# Patient Record
Sex: Female | Born: 1980
Health system: Southern US, Community
[De-identification: ages and names within clinical notes are randomized; demographics above are authoritative.]

## PROBLEM LIST (undated history)

## (undated) DIAGNOSIS — F32A Depression, unspecified: Secondary | ICD-10-CM

## (undated) DIAGNOSIS — D6859 Other primary thrombophilia: Secondary | ICD-10-CM

## (undated) DIAGNOSIS — R51 Headache: Secondary | ICD-10-CM

## (undated) DIAGNOSIS — I2699 Other pulmonary embolism without acute cor pulmonale: Secondary | ICD-10-CM

## (undated) DIAGNOSIS — I82409 Acute embolism and thrombosis of unspecified deep veins of unspecified lower extremity: Secondary | ICD-10-CM

## (undated) DIAGNOSIS — R519 Headache, unspecified: Secondary | ICD-10-CM

## (undated) DIAGNOSIS — F329 Major depressive disorder, single episode, unspecified: Secondary | ICD-10-CM

## (undated) DIAGNOSIS — Z8719 Personal history of other diseases of the digestive system: Secondary | ICD-10-CM

## (undated) DIAGNOSIS — F419 Anxiety disorder, unspecified: Secondary | ICD-10-CM

## (undated) DIAGNOSIS — R87629 Unspecified abnormal cytological findings in specimens from vagina: Secondary | ICD-10-CM

## (undated) HISTORY — DX: Other pulmonary embolism without acute cor pulmonale: I26.99

## (undated) HISTORY — DX: Acute embolism and thrombosis of unspecified deep veins of unspecified lower extremity: I82.409

## (undated) HISTORY — PX: LEEP: SHX91

## (undated) HISTORY — PX: FIRST RIB REMOVAL: SHX642

## (undated) HISTORY — DX: Other primary thrombophilia: D68.59

## (undated) HISTORY — DX: Depression, unspecified: F32.A

## (undated) HISTORY — PX: ESOPHAGOGASTRODUODENOSCOPY ENDOSCOPY: SHX5814

## (undated) HISTORY — DX: Anxiety disorder, unspecified: F41.9

## (undated) HISTORY — DX: Major depressive disorder, single episode, unspecified: F32.9

## (undated) HISTORY — PX: NASAL SINUS SURGERY: SHX719

## (undated) HISTORY — DX: Personal history of other diseases of the digestive system: Z87.19

---

## 2003-09-18 ENCOUNTER — Other Ambulatory Visit: Admission: RE | Admit: 2003-09-18 | Discharge: 2003-09-18 | Payer: Self-pay | Admitting: *Deleted

## 2003-09-18 ENCOUNTER — Other Ambulatory Visit: Admission: RE | Admit: 2003-09-18 | Discharge: 2003-09-18 | Payer: Self-pay | Admitting: Obstetrics and Gynecology

## 2004-12-18 ENCOUNTER — Other Ambulatory Visit: Admission: RE | Admit: 2004-12-18 | Discharge: 2004-12-18 | Payer: Self-pay | Admitting: Obstetrics and Gynecology

## 2005-08-06 ENCOUNTER — Emergency Department (HOSPITAL_COMMUNITY): Admission: EM | Admit: 2005-08-06 | Discharge: 2005-08-06 | Payer: Self-pay | Admitting: Emergency Medicine

## 2005-09-23 ENCOUNTER — Emergency Department (HOSPITAL_COMMUNITY): Admission: EM | Admit: 2005-09-23 | Discharge: 2005-09-23 | Payer: Self-pay | Admitting: Family Medicine

## 2006-01-11 ENCOUNTER — Other Ambulatory Visit: Admission: RE | Admit: 2006-01-11 | Discharge: 2006-01-11 | Payer: Self-pay | Admitting: Obstetrics and Gynecology

## 2006-05-16 ENCOUNTER — Emergency Department (HOSPITAL_COMMUNITY): Admission: EM | Admit: 2006-05-16 | Discharge: 2006-05-16 | Payer: Self-pay | Admitting: Emergency Medicine

## 2006-11-04 ENCOUNTER — Emergency Department (HOSPITAL_COMMUNITY): Admission: EM | Admit: 2006-11-04 | Discharge: 2006-11-04 | Payer: Self-pay | Admitting: Family Medicine

## 2007-01-17 ENCOUNTER — Other Ambulatory Visit: Admission: RE | Admit: 2007-01-17 | Discharge: 2007-01-17 | Payer: Self-pay | Admitting: Obstetrics & Gynecology

## 2007-02-09 ENCOUNTER — Inpatient Hospital Stay (HOSPITAL_COMMUNITY): Admission: EM | Admit: 2007-02-09 | Discharge: 2007-02-13 | Payer: Self-pay | Admitting: Family Medicine

## 2007-02-22 ENCOUNTER — Emergency Department (HOSPITAL_COMMUNITY): Admission: EM | Admit: 2007-02-22 | Discharge: 2007-02-22 | Payer: Self-pay | Admitting: Emergency Medicine

## 2007-03-15 ENCOUNTER — Emergency Department (HOSPITAL_COMMUNITY): Admission: EM | Admit: 2007-03-15 | Discharge: 2007-03-15 | Payer: Self-pay | Admitting: Family Medicine

## 2007-04-18 ENCOUNTER — Encounter: Admission: RE | Admit: 2007-04-18 | Discharge: 2007-04-18 | Payer: Self-pay | Admitting: Emergency Medicine

## 2008-01-05 ENCOUNTER — Ambulatory Visit: Payer: Self-pay | Admitting: Internal Medicine

## 2008-01-16 LAB — HYPERCOAGULABLE PANEL, COMPREHENSIVE RET.
Anticardiolipin IgM: 9 [MPL'U] (ref <10)
Beta-2 Glyco I IgG: <4 U/mL (ref <20)
Beta-2-Glycoprotein I IgM: <4 U/mL (ref <10)
Homocysteine: 9.6 umol/L (ref 4.0–15.4)
PTT Lupus Anticoagulant: 38.1 secs (ref 36.3–48.8)
Protein C Activity: 118 % (ref 75–133)
Protein C, Total: 71 % (ref 70–140)
Protein S Ag, Total: 104 % (ref 70–140)

## 2008-01-19 ENCOUNTER — Other Ambulatory Visit: Admission: RE | Admit: 2008-01-19 | Discharge: 2008-01-19 | Payer: Self-pay | Admitting: Obstetrics & Gynecology

## 2008-04-18 IMAGING — CT CT ANGIO CHEST
2 of 5 series · 18 of 36 positions shown · IV contrast (omnipaque)
Comparison: Chest CTA 02/09/07.

CLINICAL DATA: Persistent right back pain and dyspnea.  On Lovenox and Coumadin for recent pulmonary embolism.  
 CT ANGIOGRAPHY OF CHEST:
TECHNIQUE: Multidetector CT imaging of the chest was performed during bolus injection of intravenous contrast.  Multiplanar CT angiographic image reconstructions were generated to evaluate the vascular anatomy.
 Contrast:  100 cc Omnipaque 300.

[Series 3: pe · axial · 0.62mm/px · z∈[-246,-10]mm · 15 of 215 slices shown]
[im 13/215  lung]
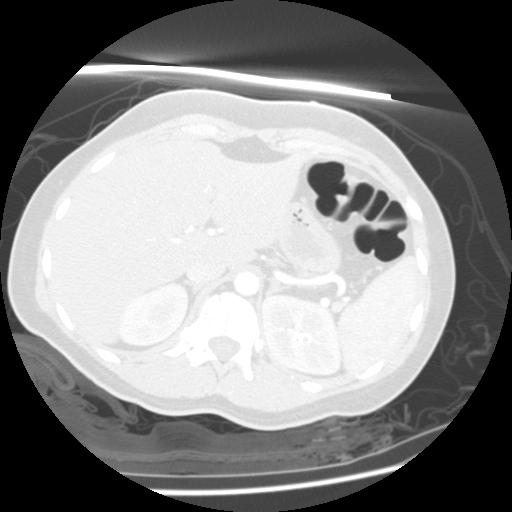
[im 26/215  mediastinal]
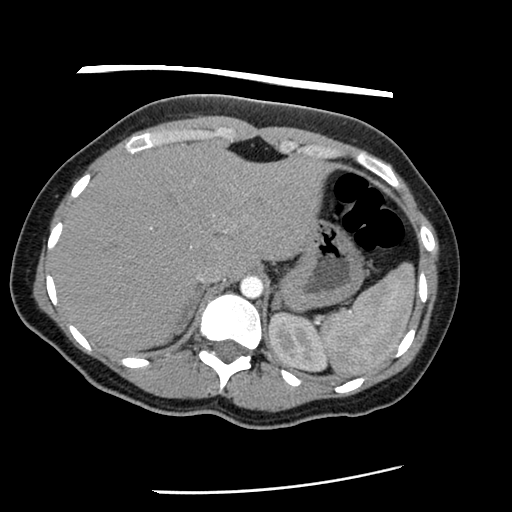
[im 38/215  lung]
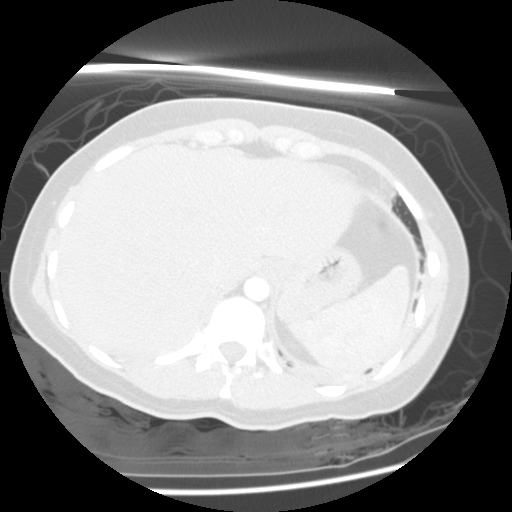
[im 51/215  mediastinal]
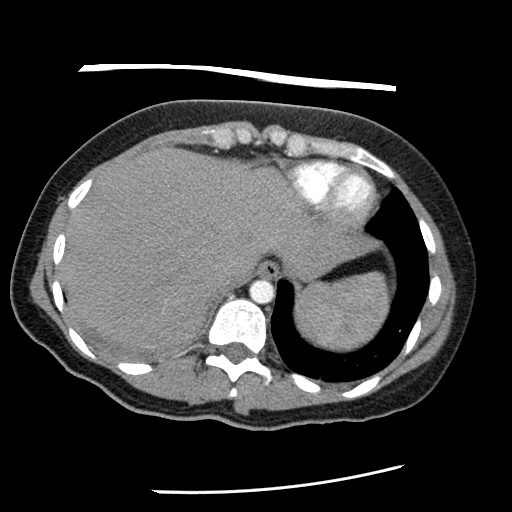
[im 63/215  lung]
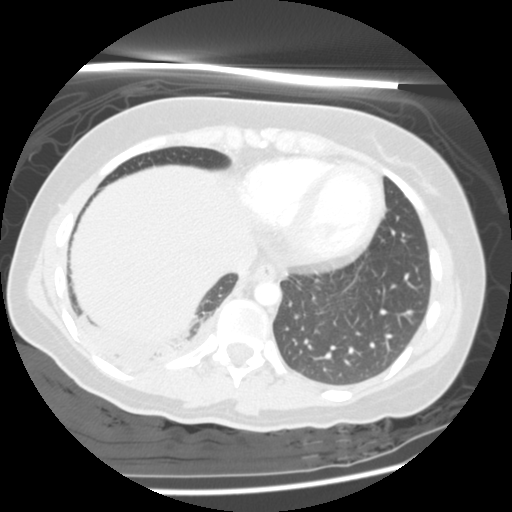
[im 76/215  mediastinal]
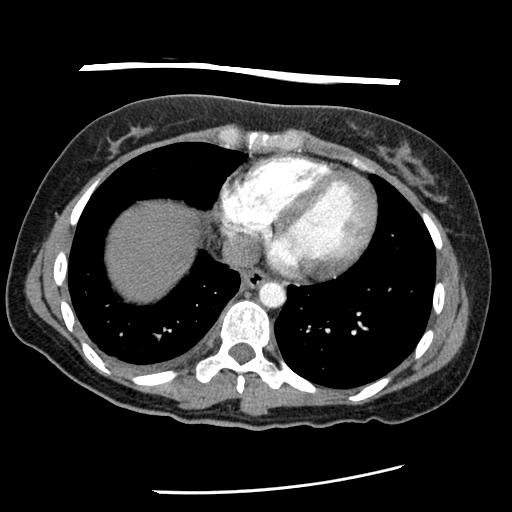
[im 89/215  lung]
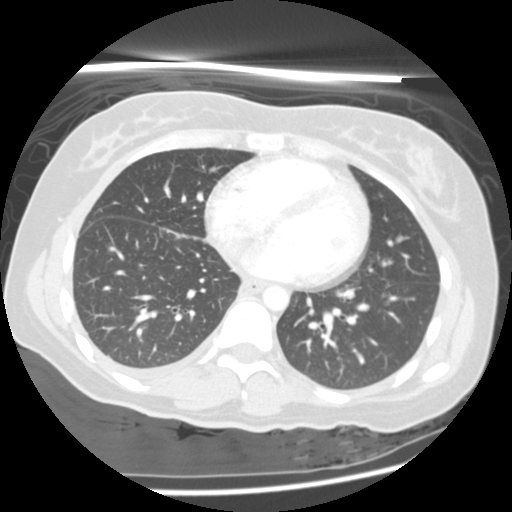
[im 114/215  mediastinal]
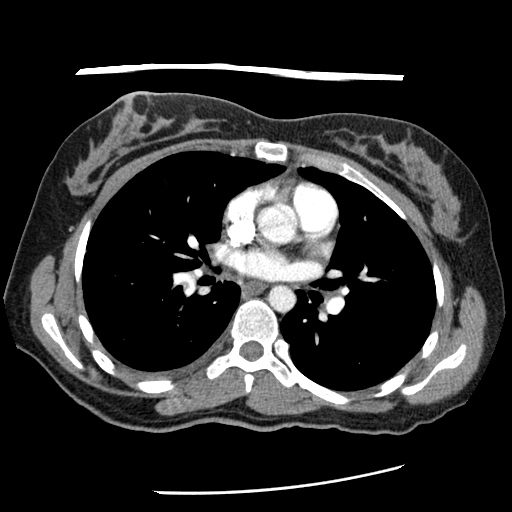
[im 126/215  lung]
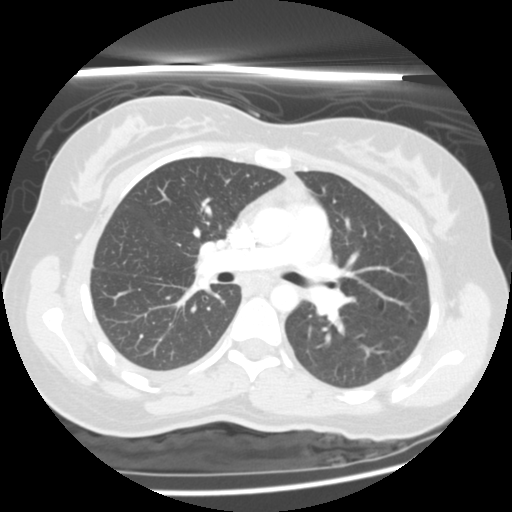
[im 139/215  mediastinal]
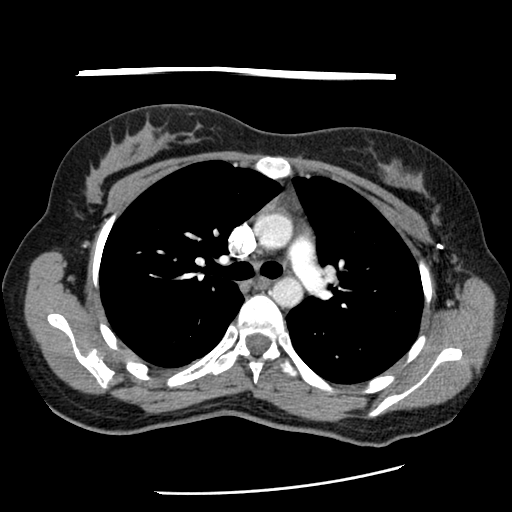
[im 152/215  lung]
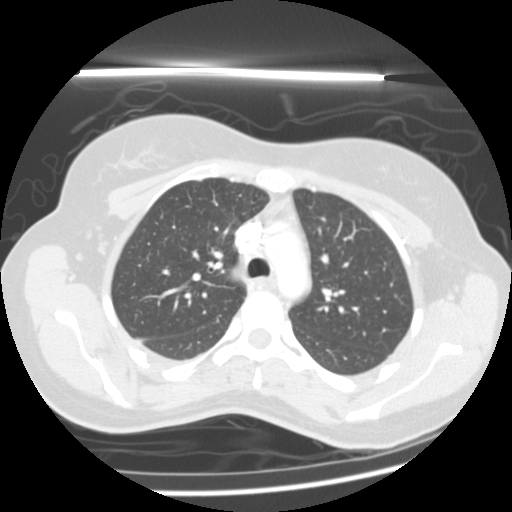
[im 164/215  mediastinal]
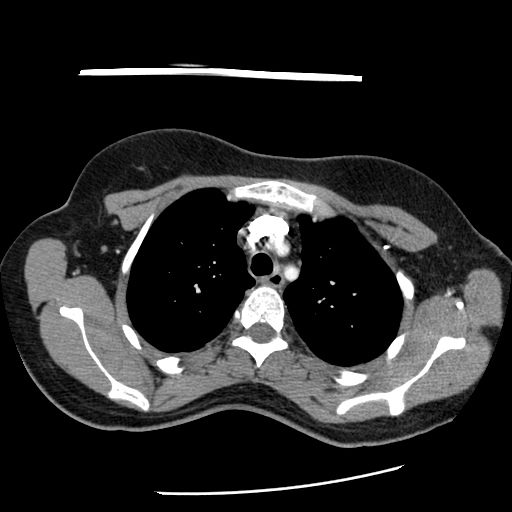
[im 177/215  lung]
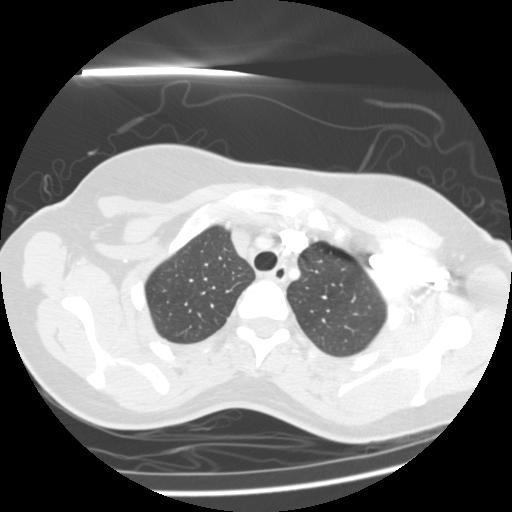
[im 189/215  mediastinal]
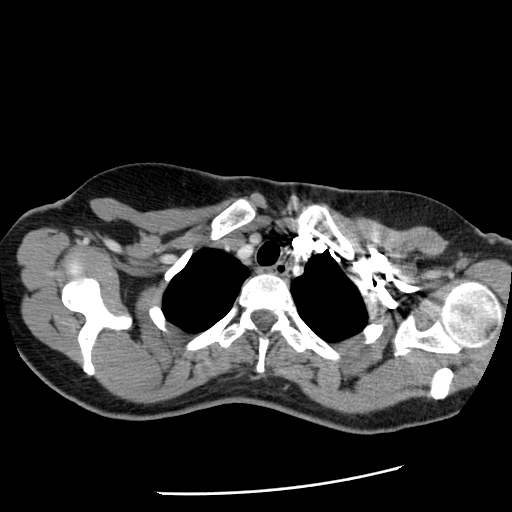
[im 202/215  lung]
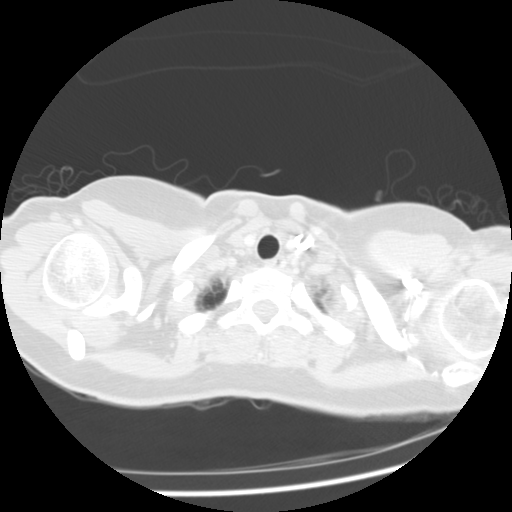

[Series 301: reformatted · coronal · 0.61mm/px · 3 of 111 slices shown]
[im 23/111  mediastinal]
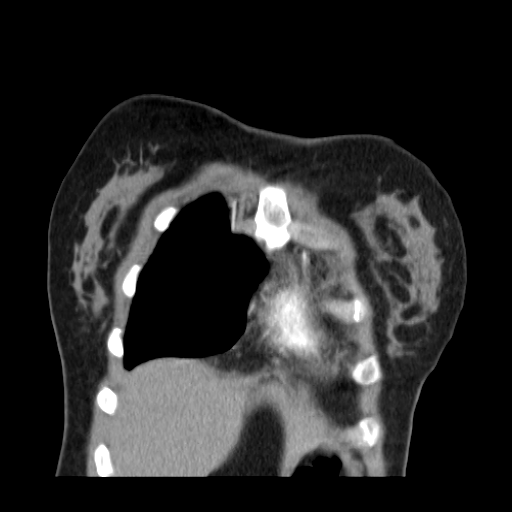
[im 45/111  mediastinal]
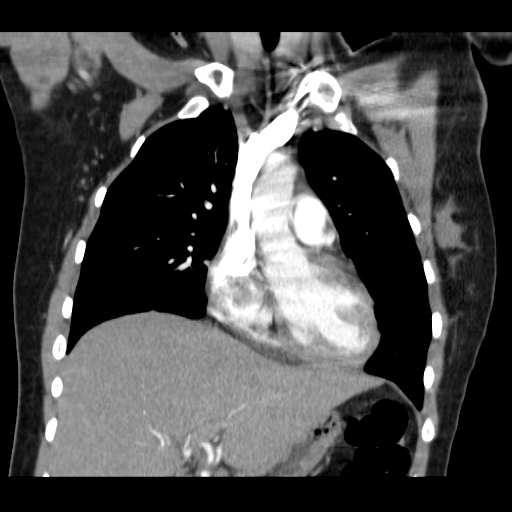
[im 67/111  mediastinal]
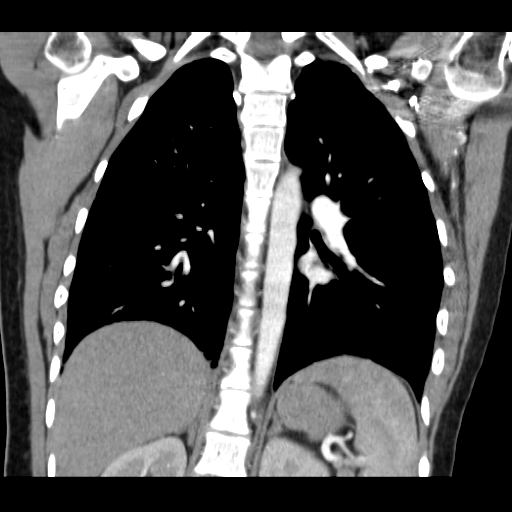

[18 of 36 positions shown; findings below may reference images not displayed]

FINDINGS: The pulmonary arteries are well opacified with contrast.  The previously demonstrated pulmonary emboli bilaterally have largely lysed.  A few residual nonocclusive emboli remain, predominantly on the right.  There is no evidence of interval pulmonary embolism.  The thoracic aorta appears stable.  The mediastinum appears stable with residual thymic tissue.  No enlarged mediastinal or hilar lymph nodes are present.  
 There is a small right pleural effusion, which has enlarged compared with the prior examination.  No left pleural effusion or pericardial effusion is present.  Probable evolving pulmonary infarct in the posterior basal segment of the right lower lobe is noted.  There may be an additional small infarct posteriorly in the superior segment of the right lower lobe.  Mild dependent atelectasis is present at both lung bases.  The exophytic chondroid lesion projecting anteriorly from the right scapular body is without apparent change.  
 Images through the upper abdomen demonstrate no acute findings.
IMPRESSION: 1.  Partial lysis of bilateral pulmonary emboli.  No remaining occlusive thrombi are demonstrated, and there is no evidence of interval thromboembolism.  
 2.  Small right pleural effusion is slightly enlarged compared with the prior exam.  There are probably evolving infarcts in the right lower lobe with associated bibasilar atelectasis.  
 3.  No change exophytic chondroid lesion of the right scapula.  MRI of the scapular lesion is suggested for further evaluation.

## 2010-07-05 ENCOUNTER — Encounter: Payer: Self-pay | Admitting: Emergency Medicine

## 2010-10-27 NOTE — Discharge Summary (Signed)
Sherry Ray, Sherry Ray NO.:  [id number]   MEDICAL RECORD NO.:  [id number]          PATIENT TYPE:  INP   LOCATION:  3704                         FACILITY:  [facility]   PHYSICIAN:  Lonia Blood, M.D.       DATE OF BIRTH:  04-26-1981   DATE OF PROCEDURE:  DATE OF DISCHARGE:                    STAT - MUST CHANGE TO CORRECT WORK TYPE   DISCHARGE DIAGNOSES:  1. Bilateral pulmonary emboli in the lower lobes and right middle      lobe.  2. Right lower lobe consolidation, pulmonary infarct.  3. Small right pleural effusion most likely related to the pulmonary      emboli.  4. Right common iliac vein thrombosis.  5. Multiple bony protuberances of the extremities as well as the      pelvis compatible with multiple hereditary exostoses.  6. A 3 cm exophytic lesion of the right scapula with aggressive      features, question chondrosarcoma.  7. Mild anemia, hemoglobin of 11.5.  8. Mild leukocytosis, white blood cell count stable at 11,000.   DISCHARGE MEDICATIONS:  1. Lovenox 60 mg subcutaneously q.12h.  2. Protonix 40 mg daily.  3. Dilaudid 1 mg IV q.2h. p.r.n.   CONDITION ON DISCHARGE:  The patient is awaiting transfer to [facility] for work up of the scapular mass.   PROCEDURE:  The patient underwent a computerized scan of the chest as  well as computer tomography scan of the abdomen and pelvis with findings  of bilateral pulmonary emboli, small right pleural effusion as well as a  right scapular mass.   CONSULTATIONS:  No consultations obtained.   HISTORY OF PRESENT ILLNESS:  Refer to the dictated H&P done by Dr.  Christoper Allegra.   HOSPITAL COURSE:  1. Bilateral pulmonary emboli. Ms. Sherry Ray was admitted to the      telemetry unit. She had the hypercoagulable work up initiated,      results of which are pending currently. The etiology of the      patient's pulmonary embolus was related to a right iliac vein      thrombosis. The cause of the iliac  vein thrombosis is unclear but      the patient was on Nuva ring which is hormone impregnated and could      be the cause of the pulmonary emboli. The more worrisome finding      was a right scapular mass with features of malignancy which could      also be playing a role in the patient's hypercoagulable state. Ms.      Sherry Ray was initiated on Lovenox subcutaneously, 60 mg twice a      day, and she has stayed stable from her pulmonary emboli point of      view. The patient remained hemodynamically stable without severe      hypoxia. She has still a significant degree of pleuritic chest pain      related to her pulmonary infarction and pleural effusion. This is      treated currently with intravenous Dilaudid.  2. Right scapular mass. The patient's 3 cm scapular mass has been an  incidental finding of the computerized scan of the chest.      Apparently the patient has congenital exostoses but the scapular      mass is very worrisome for the      possibility of a chondrosarcoma. Given the fact that our local      hospital does not work up primary malignant bone tumors we have      contacted [facility] and the patient is pending      transfer there for the work up of the scapular mass.      Lonia Blood, M.D.  Electronically Signed     SL/MEDQ  D:  02/13/2007  T:  02/13/2007  Job:  [id number]

## 2010-10-27 NOTE — H&P (Signed)
NAMEHENRINE, Sherry Ray           ACCOUNT NO.:  [id number]   MEDICAL RECORD NO.:  [id number]          PATIENT TYPE:  INP   LOCATION:  3729                         FACILITY:  [facility]   PHYSICIAN:  Wilson Singer, M.D.DATE OF BIRTH:  1981-03-21   DATE OF ADMISSION:  02/09/2007  DATE OF DISCHARGE:                              HISTORY & PHYSICAL   HISTORY:  This is a very pleasant 30 year old lady, who gives a 2-3 day  history of right side pleuritic chest pain associated with some  shortness of breath.  She went to see the urgent care today and they  were concerned about a right scapular pain and mass and they ordered a  CT of the chest, which revealed bilateral pulmonary emboli as well as a  possible right scapular osteochondroma.  She has a Nuva-Ring for  contraception and just removed it today.  She has not been on any long  journeys.  There is no history of coughing up blood nor any history of  swelling in legs.   Past medical history is significant for a history of a left cervical rib  causing a blood clot in the left side, which needed to have thrombolysis  from her history at the age of 68.   PAST MEDICAL HISTORY:  Sinus surgery 2006.   MEDICATIONS:  Zyrtec for allergies.   ALLERGIES:  None.   SOCIAL HISTORY:  She is single, but lives with her boyfriend.  She does  not smoke and occasionally drinks alcohol.  She works for a [employer].   FAMILY HISTORY:  Noncontributory.   REVIEW OF SYSTEMS:  Apart from the systems mentioned above, there are no  other symptoms referable to all systems reviewed.   PHYSICAL EXAMINATION:  Temperature 98.3, blood pressure 119/56, pulse  134, nice rhythm at rest.  Saturation 98%.  CARDIOVASCULAR:  Heart sounds are present and normal without murmurs.  There is no right ventricular heave.  The lung fields are clear with no pleural rub.  Abdomen is soft, nontender with no hepatosplenomegaly.  There are no  masses felt.  NEUROLOGICAL:  She is alert and oriented with no focal neurologic signs.   INVESTIGATIONS:  Sodium 139, potassium 3.6, BUN 8, creatinine 0.7,  glucose 84. CT angio chest shows bilateral pulmonary emboli and also  suggestion of an osteochondroma on the right scapular.   IMPRESSION:  1. Bilateral pulmonary emboli.  2. Right scapular possible osteochondroma.   PLAN:  1. Admit.  2. Anticoagulated with Lovenox and Coumadin.  3. CT of the abdomen and pelvis and venous Dopplers in the legs.  4. Hypercoagulopathy panel.  5. Need to define the right scapular mass and may require MRI      scanning.  We will await the full report.   Further recommendations will depend on patient's hospital progress.      Wilson Singer, M.D.  Electronically Signed     NCG/MEDQ  D:  02/09/2007  T:  02/10/2007  Job:  [id number]

## 2011-03-25 LAB — POCT RAPID STREP A: Streptococcus, Group A Screen (Direct): NEGATIVE

## 2011-03-26 LAB — CBC
HCT: 31.8 — ABNORMAL LOW
Hemoglobin: 10.8 — ABNORMAL LOW
MCHC: 33.9
MCV: 82.9
Platelets: 399
Platelets: 301
Platelets: 302
Platelets: 360
RBC: 4.08
RDW: 12.3
RDW: 12.6
WBC: 10.9 — ABNORMAL HIGH
WBC: 11.2 — ABNORMAL HIGH
WBC: 13.2 — ABNORMAL HIGH

## 2011-03-26 LAB — BASIC METABOLIC PANEL WITH GFR
BUN: 4 — ABNORMAL LOW
CO2: 26
Calcium: 8.7
Chloride: 100
Creatinine, Ser: 0.61
GFR calc non Af Amer: >60
Glucose, Bld: 100 — ABNORMAL HIGH
Potassium: 3.9
Sodium: 135

## 2011-03-26 LAB — POCT I-STAT CREATININE
Creatinine, Ser: 0.8
Creatinine, Ser: 0.7
Operator id: 285491
Operator id: 270111

## 2011-03-26 LAB — FACTOR 5 LEIDEN

## 2011-03-26 LAB — PROTEIN S ACTIVITY: Protein S Activity: 82 % (ref 69–129)

## 2011-03-26 LAB — BETA-2-GLYCOPROTEIN I ABS, IGG/M/A
Beta-2 Glyco I IgG: <4 U/mL (ref <20)
Beta-2-Glycoprotein I IgA: <4 U/mL (ref <10)

## 2011-03-26 LAB — PROTIME-INR
INR: 1
INR: 1
INR: 1.2
Prothrombin Time: 19.1 — ABNORMAL HIGH
Prothrombin Time: 13.4
Prothrombin Time: 13.7
Prothrombin Time: 14

## 2011-03-26 LAB — I-STAT 8, (EC8 V) (CONVERTED LAB)
Acid-Base Excess: 1
Acid-base deficit: 3 — ABNORMAL HIGH
Bicarbonate: 26.2 — ABNORMAL HIGH
Bicarbonate: 20.8
Glucose, Bld: 84
HCT: 40
Hemoglobin: 13.3
Operator id: 285491
Operator id: 270111
Sodium: 139
TCO2: 27
TCO2: 22
pCO2, Ven: 41.3 — ABNORMAL LOW
pH, Ven: 7.41 — ABNORMAL HIGH

## 2011-03-26 LAB — SAMPLE TO BLOOD BANK

## 2011-03-26 LAB — APTT
aPTT: 27
aPTT: 31
aPTT: 35
aPTT: 39 — ABNORMAL HIGH

## 2011-03-26 LAB — COMPREHENSIVE METABOLIC PANEL
Albumin: 3 — ABNORMAL LOW
BUN: 5 — ABNORMAL LOW
CO2: 24
Chloride: 103
Creatinine, Ser: 0.65
GFR calc non Af Amer: >60
Total Bilirubin: 0.5

## 2011-03-26 LAB — ANTITHROMBIN III: AntiThromb III Func: 45 — ABNORMAL LOW (ref 76–126)

## 2011-03-26 LAB — LUPUS ANTICOAGULANT PANEL
Lupus Anticoagulant: NOT DETECTED
dRVVT Incubated 1:1 Mix: 41.1 (ref 36.1–47.0)

## 2011-03-26 LAB — HOMOCYSTEINE: Homocysteine: 1.7 — ABNORMAL LOW

## 2011-03-26 LAB — PROTEIN C ACTIVITY: Protein C Activity: 66 % — ABNORMAL LOW (ref 75–133)

## 2011-03-26 LAB — PROTEIN S, TOTAL: Protein S Ag, Total: 100 % (ref 70–140)

## 2011-03-26 LAB — PROTHROMBIN GENE MUTATION

## 2011-03-26 LAB — CARDIOLIPIN ANTIBODIES, IGG, IGM, IGA: Anticardiolipin IgA: <7 — ABNORMAL LOW (ref <13)

## 2011-03-26 LAB — PROTEIN C, TOTAL: Protein C, Total: 52 % — ABNORMAL LOW (ref 70–140)

## 2011-11-11 ENCOUNTER — Encounter: Payer: Self-pay | Admitting: Family Medicine

## 2011-11-11 ENCOUNTER — Ambulatory Visit: Payer: Self-pay | Admitting: Family Medicine

## 2011-11-11 VITALS — BP 121/80 | HR 77 | Temp 98.3°F | Resp 16 | Ht 64.5 in | Wt 122.0 lb

## 2011-11-11 DIAGNOSIS — L600 Ingrowing nail: Secondary | ICD-10-CM

## 2011-11-11 DIAGNOSIS — IMO0002 Reserved for concepts with insufficient information to code with codable children: Secondary | ICD-10-CM

## 2011-11-11 MED ORDER — AMOXICILLIN-POT CLAVULANATE 875-125 MG PO TABS: 1.0000 | ORAL_TABLET | Freq: Two times a day (BID) | ORAL | Status: AC

## 2011-11-11 NOTE — Progress Notes (Signed)
  Subjective:    Patient ID: Sherry Ray, female    DOB: [birth year], 31 y.o.   MRN: [id number]  HPI    Review of Systems     Objective:   Physical Exam        Assessment & Plan:  Reviewed.  Agree with assessment and plan.

## 2011-11-11 NOTE — Progress Notes (Signed)
VCO. Digital block with 4 cc 2% lidocaine plain. SP and D.  Medial nail lifted and removed. Xeroform applied. Patient tolerated procedure well. Wound was cleaned and bandaged.

## 2011-11-11 NOTE — Patient Instructions (Addendum)
Paronychia Paronychia is an inflammatory reaction involving the folds of the skin surrounding the fingernail. This is commonly caused by an infection in the skin around a nail. The most common cause of paronychia is frequent wetting of the hands (as seen with bartenders, food servers, nurses or others who wet their hands). This makes the skin around the fingernail susceptible to infection by bacteria (germs) or fungus. Other predisposing factors are:  Aggressive manicuring.   Nail biting.   Thumb sucking.  The most common cause is a staphylococcal (a type of germ) infection, or a fungal (Candida) infection. When caused by a germ, it usually comes on suddenly with redness, swelling, pus and is often painful. It may get under the nail and form an abscess (collection of pus), or form an abscess around the nail. If the nail itself is infected with a fungus, the treatment is usually prolonged and may require oral medicine for up to one year. Your caregiver will determine the length of time treatment is required. The paronychia caused by bacteria (germs) may largely be avoided by not pulling on hangnails or picking at cuticles. When the infection occurs at the tips of the finger it is called felon. When the cause of paronychia is from the herpes simplex virus (HSV) it is called herpetic whitlow. TREATMENT  When an abscess is present treatment is often incision and drainage. This means that the abscess must be cut open so the pus can get out. When this is done, the following home care instructions should be followed. HOME CARE INSTRUCTIONS   It is important to keep the affected fingers very dry. Rubber or plastic gloves over cotton gloves should be used whenever the hand must be placed in water.   Keep wound clean, dry and dressed as suggested by your caregiver between warm soaks or warm compresses.   Soak in warm water for fifteen to twenty minutes three to four times per day for bacterial infections.  Fungal infections are very difficult to treat, so often require treatment for long periods of time.   For bacterial (germ) infections take antibiotics (medicine which kill germs) as directed and finish the prescription, even if the problem appears to be solved before the medicine is gone.   Only take over-the-counter or prescription medicines for pain, discomfort, or fever as directed by your caregiver.  SEEK IMMEDIATE MEDICAL CARE IF:  You have redness, swelling, or increasing pain in the wound.   You notice pus coming from the wound.   You have a fever.   You notice a bad smell coming from the wound or dressing.  Document Released: 11/24/2000 Document Revised: 05/20/2011 Document Reviewed: 07/26/2008 [facility] Patient Information 2012 [place], [place].  INGROWN TOENAIL . Keep area clean, dry and bandaged for 24 hours. . After 24 hours, remove outer bandage and leave yellow gauze in place. Nuala Alpha toe/foot in warm soapy water for 5-10 minutes, once daily for 5 days. Rebandage toe after each cleaning. . Continue soaks until yellow gauze falls off. . Notify the office if you experience any of the following signs of infection: Swelling, redness, pus drainage, streaking, fever > 101.0 F

## 2011-11-11 NOTE — Progress Notes (Signed)
  Subjective:    Patient ID: Sherry Ray, female    DOB: Mar 05, 1981, 31 y.o.   MRN: [id number]  HPI L great toe swelling x 1 week.  Attempted to cut great toe nail and noticed significant lateral toe nail swelling 1-2 days later.  Has had mild purulent drainage that has progressively worsened over last 3-4 days.  Works on [employer] all day in [employer].  Has been using peroxide with minimal improvement in sxs.  No systemic sxs including fevers, chills, nausea, vomiting.   Review of Systems See HPI, otherwise ROS negative     Objective:   Physical Exam  Musculoskeletal:       Feet:   Gen: in bed, NAD HEENT: NCAT, EOMI CV: RRR PULM: CTAB        Assessment & Plan:  Paronychia: toe nail resection of affected portion at bedside. Augmentin x 10 days. Infectious red flags reviewed. Follow up in 3-5 days or sooner if needed.

## 2013-08-21 ENCOUNTER — Ambulatory Visit (INDEPENDENT_AMBULATORY_CARE_PROVIDER_SITE_OTHER): Payer: BC Managed Care – PPO | Admitting: Emergency Medicine

## 2013-08-21 VITALS — BP 122/72 | HR 82 | Temp 98.4°F | Resp 17 | Ht 64.5 in | Wt 142.0 lb

## 2013-08-21 DIAGNOSIS — M7700 Medial epicondylitis, unspecified elbow: Secondary | ICD-10-CM

## 2013-08-21 NOTE — Patient Instructions (Signed)
Medial Epicondylitis (Golfer's Elbow) with Rehab Medial epicondylitis involves inflammation and pain around the inner (medial) portion of the elbow. This pain is caused by inflammation of the tendons in the forearm that flex (bring down) the wrist. Medial epicondylitis is also called golfer's elbow, because it is common among golfers. However, it may occur in any individual who flexes the wrist regularly. If medial epicondylitis is left untreated, it may become a chronic problem. SYMPTOMS   Pain, tenderness, or inflammation over the inner (medial) side of the elbow.  Pain or weakness with gripping activities.  Pain that increases with wrist twisting motions (using a screwdriver, playing golf, bowling). CAUSES  Medial epicondylitis is caused by inflammation of the tendons that flex the wrist. Causes of injury may include:  Chronic, repetitive stress and strain to the tendons that run from the wrist and forearm to the elbow.  Sudden strain on the forearm, including wrist snap when serving balls with racquet sports, or throwing a baseball. RISK INCREASES WITH:  Sports or occupations that require repetitive and/or strenuous forearm and wrist movements (pitching a baseball, golfing, carpentry).  Poor wrist and forearm strength and flexibility.  Failure to warm up properly before activity.  Resuming activity before healing, rehabilitation, and conditioning are complete. PREVENTION   Warm up and stretch properly before activity.  Maintain physical fitness:  Strength, flexibility, and endurance.  Cardiovascular fitness.  Wear and use properly fitted equipment.  Learn and use proper technique and have a coach correct improper technique.  Wear a tennis elbow (counterforce) brace. PROGNOSIS  The course of this condition depends on the degree of the injury. If treated properly, acute cases (symptoms lasting less than 4 weeks) are often resolved in 2 to 6 weeks. Chronic (longer lasting  cases) often resolve in 3 to 6 months, but may require physical therapy. RELATED COMPLICATIONS   Frequently recurring symptoms, resulting in a chronic problem. Properly treating the problem the first time decreases frequency of recurrence.  Chronic inflammation, scarring, and partial tendon tear, requiring surgery.  Delayed healing or resolution of symptoms. TREATMENT  Treatment first involves the use of ice and medicine, to reduce pain and inflammation. Strengthening and stretching exercises may reduce discomfort, if performed regularly. These exercises may be performed at home, if the condition is an acute injury. Chronic cases may require a referral to a physical therapist for evaluation and treatment. Your caregiver may advise a corticosteroid injection to help reduce inflammation. Rarely, surgery is needed. MEDICATION  If pain medicine is needed, nonsteroidal anti-inflammatory medicines (aspirin and ibuprofen), or other minor pain relievers (acetaminophen), are often advised.  Do not take pain medicine for 7 days before surgery.  Prescription pain relievers may be given, if your caregiver thinks they are needed. Use only as directed and only as much as you need.  Corticosteroid injections may be recommended. These injections should be reserved only for the most severe cases, because they can only be given a certain number of times. HEAT AND COLD  Cold treatment (icing) should be applied for 10 to 15 minutes every 2 to 3 hours for inflammation and pain, and immediately after activity that aggravates your symptoms. Use ice packs or an ice massage.  Heat treatment may be used before performing stretching and strengthening activities prescribed by your caregiver, physical therapist, or athletic trainer. Use a heat pack or a warm water soak. SEEK MEDICAL CARE IF: Symptoms get worse or do not improve in 2 weeks, despite treatment. EXERCISES  RANGE OF MOTION (  prescribed by your caregiver, physical therapist, or athletic trainer. Use a heat pack or a warm water soak.  SEEK MEDICAL CARE IF:  Symptoms get worse or do not improve in 2 weeks, despite treatment.  EXERCISES   RANGE OF MOTION (ROM) AND STRETCHING EXERCISES -  Epicondylitis, Medial (Golfer's Elbow)  These exercises may help you when beginning to rehabilitate your injury. Your symptoms may go away with or without further involvement from your physician, physical therapist or athletic trainer. While completing these exercises, remember:   · Restoring tissue flexibility helps normal motion to return to the joints. This allows healthier, less painful movement and activity.  · An effective stretch should be held for at least 30 seconds.  · A stretch should never be painful. You should only feel a gentle lengthening or release in the stretched tissue.  RANGE OF MOTION  Wrist Flexion, Active-Assisted  · Extend your right / left elbow with your fingers pointing down.*  · Gently pull the back of your hand towards you, until you feel a gentle stretch on the top of your forearm.  · Hold this position for __________ seconds.  Repeat __________ times. Complete this exercise __________ times per day.   *If directed by your physician, physical therapist or athletic trainer, complete this stretch with your elbow bent, rather than extended.  RANGE OF MOTION  Wrist Extension, Active-Assisted  · Extend your right / left elbow and turn your palm upwards.*  · Gently pull your palm and fingertips back, so your wrist extends and your fingers point more toward the ground.  · You should feel a gentle stretch on the inside of your forearm.  · Hold this position for __________ seconds.  Repeat __________ times. Complete this exercise __________ times per day.  *If directed by your physician, physical therapist or athletic trainer, complete this stretch with your elbow bent, rather than extended.  STRETCH  Wrist Extension   · Place your right / left fingertips on a tabletop leaving your elbow slightly bent. Your fingers should point backwards.  · Gently press your fingers and palm down onto the table, by straightening your elbow. You should feel a stretch on the inside of your forearm.  · Hold this  position for __________ seconds.  Repeat __________ times. Complete this stretch __________ times per day.   STRENGTHENING EXERCISES - Epicondylitis, Medial (Golfer's Elbow)  These exercises may help you when beginning to rehabilitate your injury. They may resolve your symptoms with or without further involvement from your physician, physical therapist or athletic trainer. While completing these exercises, remember:   · Muscles can gain both the endurance and the strength needed for everyday activities through controlled exercises.  · Complete these exercises as instructed by your physician, physical therapist or athletic trainer. Increase the resistance and repetitions only as guided.  · You may experience muscle soreness or fatigue, but the pain or discomfort you are trying to eliminate should never worsen during these exercises. If this pain does get worse, stop and make sure you are following the directions exactly. If the pain is still present after adjustments, discontinue the exercise until you can discuss the trouble with your caregiver.  STRENGTH Wrist Flexors  · Sit with your right / left forearm palm-up, and fully supported on a table or countertop. Your elbow should be resting below the height of your shoulder. Allow your wrist to extend over the edge of the surface.  · Loosely holding a __________ weight, or a piece   of rubber exercise band or tubing, slowly curl your hand up toward your forearm.  · Hold this position for __________ seconds. Slowly lower the wrist back to the starting position in a controlled manner.  Repeat __________ times. Complete this exercise __________ times per day.   STRENGTH  Wrist Extensors  · Sit with your right / left forearm palm-down and fully supported. Your elbow should be resting below the height of your shoulder. Allow your wrist to extend over the edge of the surface.  · Loosely holding a __________ weight, or a piece of rubber exercise band or tubing, slowly curl  your hand up toward your forearm.  · Hold this position for __________ seconds. Slowly lower the wrist back to the starting position in a controlled manner.  Repeat __________ times. Complete this exercise __________ times per day.   STRENGTH - Ulnar Deviators  · Stand with a ____________________ weight in your right / left hand, or sit while holding a rubber exercise band or tubing, with your healthy arm supported on a table or countertop.  · Move your wrist so that your pinkie travels toward your forearm and your thumb moves away from your forearm.  · Hold this position for __________ seconds and then slowly lower the wrist back to the starting position.  Repeat __________ times. Complete this exercise __________ times per day  STRENGTH - Grip   · Grasp a tennis ball, a dense sponge, or a large, rolled sock in your hand.  · Squeeze as hard as you can, without increasing any pain.  · Hold this position for __________ seconds. Release your grip slowly.  Repeat __________ times. Complete this exercise __________ times per day.   STRENGTH  Forearm Supinators   · Sit with your right / left forearm supported on a table, keeping your elbow below shoulder height. Rest your hand over the edge, palm down.  · Gently grip a hammer or a soup ladle.  · Without moving your elbow, slowly turn your palm and hand upward to a "thumbs-up" position.  · Hold this position for __________ seconds. Slowly return to the starting position.  Repeat __________ times. Complete this exercise __________ times per day.   STRENGTH  Forearm Pronators  · Sit with your right / left forearm supported on a table, keeping your elbow below shoulder height. Rest your hand over the edge, palm up.  · Gently grip a hammer or a soup ladle.  · Without moving your elbow, slowly turn your palm and hand upward to a "thumbs-up" position.  · Hold this position for __________ seconds. Slowly return to the starting position.  Repeat __________ times. Complete this  exercise __________ times per day.   Document Released: 05/31/2005 Document Revised: 08/23/2011 Document Reviewed: 09/12/2008  [employer]® Patient Information ©2014 [employer].

## 2013-08-21 NOTE — Progress Notes (Signed)
   Subjective:    Patient ID: Sherry Ray, female    DOB: 09-12-1980, 33 y.o.   MRN: [id number]  HPI 33 yo female with complaint of 2 weeks of right medial elbow pain.  Onset after lifting heavy objects a lot one day.  No specific injury.  Progressively worsening symptoms over next day.  Worse now with activity.  No numbness or radiation.  No swelling.  No weakness.  Not worsened by playing violin  SH:  Smoker, occasional alcohol  PPMH:  Non contributory.   Review of Systems  Constitutional: Negative for fever and chills.  Musculoskeletal: Positive for joint swelling. Negative for back pain, myalgias, neck pain and neck stiffness.  Skin: Negative for color change and rash.  Neurological: Negative for weakness and numbness.       Objective:   Physical Exam Blood pressure 122/72, pulse 82, temperature 98.4 F (36.9 C), temperature source Oral, resp. rate 17, height 5' 4.5" (1.638 m), weight 142 lb (64.411 kg), last menstrual period 08/07/2013, SpO2 96.00%. Body mass index is 24.01 kg/(m^2). Well-developed, well nourished female who is awake, alert and oriented, in NAD. HEENT: [place]/AT, PERRL, EOMI.  Sclera and conjunctiva are clear.  Neck: supple Heart: RRR, no murmur Lungs: normal effort, CTA Abdomen: normo-active bowel sounds, supple, non-tender, no mass or organomegaly. Extremities: Right elbow:  5/5 strength (flexion, extension, pronation, suppination), mild TTP medial joint line, valgus stress of elbow with no evidence of laxity when compared to left.  No effusion. Skin: warm and dry without rash. Psychologic: good mood and appropriate affect, normal speech and behavior.     Assessment & Plan:  Medial epicondylitis - eccentric stretching, nsaids as needed.  Return if no improvement in 4-6 weeks.

## 2015-12-17 ENCOUNTER — Ambulatory Visit (INDEPENDENT_AMBULATORY_CARE_PROVIDER_SITE_OTHER): Payer: BLUE CROSS/BLUE SHIELD | Admitting: Physician Assistant

## 2015-12-17 DIAGNOSIS — F418 Other specified anxiety disorders: Secondary | ICD-10-CM | POA: Insufficient documentation

## 2015-12-17 DIAGNOSIS — L03111 Cellulitis of right axilla: Secondary | ICD-10-CM

## 2015-12-17 MED ORDER — DOXYCYCLINE HYCLATE 100 MG PO TABS: 100.0000 mg | ORAL_TABLET | Freq: Two times a day (BID) | ORAL | Status: DC

## 2015-12-17 NOTE — Progress Notes (Signed)
   Sherry Ray  MRN: [id number] DOB: [birth year]  Subjective:  Pt presents to clinic with a knot in her right axilla - she noticed it about 3 days ago and is not sure if it has changed since her noticed it.  She has not had fevers or chills since she noticed the bump.  She has not had an injury to the area.  She has never had anything like this before.  Patient Active Problem List   Diagnosis Date Noted  . Depression with anxiety 12/17/2015    No current outpatient prescriptions on file prior to visit.   No current facility-administered medications on file prior to visit.    No Known Allergies  Review of Systems  Constitutional: Negative for fever and chills.   Objective:  BP 120/82 mmHg  Pulse 85  Temp(Src) 98.3 F (36.8 C) (Oral)  Resp 16  Ht 5' 4.5" (1.638 m)  Wt 150 lb (68.04 kg)  BMI 25.36 kg/m2  SpO2 98%  LMP 12/10/2015  Physical Exam  Constitutional: She is oriented to person, place, and time and well-developed, well-nourished, and in no distress.  HENT:  Head: Normocephalic and atraumatic.  Right Ear: Hearing and external ear normal.  Left Ear: Hearing and external ear normal.  Eyes: Conjunctivae are normal.  Neck: Normal range of motion.  Cardiovascular: Normal rate, regular rhythm and normal heart sounds.   No murmur heard. Pulmonary/Chest: Effort normal and breath sounds normal. She has no wheezes.  Lymphadenopathy:    She has axillary adenopathy.       Right axillary: Pectoral (nontender) adenopathy present.  At inferior most part of the axilla there is a 1.5-2cm tender nodules with erythema on the surface of the skin over the nodule.  Neurological: She is alert and oriented to person, place, and time. Gait normal.  Skin: Skin is warm and dry.  Psychiatric: Mood, memory, affect and judgment normal.  Vitals reviewed.   Assessment and Plan :  Cellulitis of axilla, right - Plan: doxycycline (VIBRA-TABS) 100 MG tablet   Suspect an abcess and will  treat with abx - she will use warm compresses - and monitor if not resolved or improving she will contact me.  Windell Hummingbird PA-C  [facility] 12/17/2015 5:21 PM

## 2015-12-17 NOTE — Patient Instructions (Addendum)
  Warm compresses to the area    IF you received an x-ray today, you will receive an invoice from [facility]. Please contact [facility] at [phone] with questions or concerns regarding your invoice.   IF you received labwork today, you will receive an invoice from [employer]. Please contact [facility] at [phone] with questions or concerns regarding your invoice.   Our billing staff will not be able to assist you with questions regarding bills from these companies.  You will be contacted with the lab results as soon as they are available. The fastest way to get your results is to activate your My Chart account. Instructions are located on the last page of this paperwork. If you have not heard from [place] regarding the results in 2 weeks, please contact this office.

## 2016-07-14 ENCOUNTER — Ambulatory Visit (INDEPENDENT_AMBULATORY_CARE_PROVIDER_SITE_OTHER): Payer: BLUE CROSS/BLUE SHIELD | Admitting: Family Medicine

## 2016-07-14 VITALS — BP 122/72 | HR 90 | Temp 98.8°F | Resp 17 | Ht 65.0 in | Wt 155.0 lb

## 2016-07-14 DIAGNOSIS — Z23 Encounter for immunization: Secondary | ICD-10-CM

## 2016-07-14 DIAGNOSIS — D6859 Other primary thrombophilia: Secondary | ICD-10-CM | POA: Diagnosis not present

## 2016-07-14 DIAGNOSIS — R635 Abnormal weight gain: Secondary | ICD-10-CM

## 2016-07-14 NOTE — Progress Notes (Signed)
Subjective:  Sherry Ray is a 36 y.o. year old female who presents to office today for an annual physical examination.  Concerns today include:  1. Weight gain and concerns for high cholesterol: Patient notes that over the past 3-4 years she's gained 30 lbs. She is worried about this. 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 She has been trying to lower carbohydrates and sugar. She notes that she has had little motivation in her free time to exercise. She has tried to go to yoga on her lunch breaks. She notes that she had her cholesterol checked at work and it was high.   Review of Systems Constitutional: negative for chills, fatigue and fevers Eyes: negative Ears, nose, mouth, throat, and face: positive for nasal congestion Respiratory: negative Cardiovascular: negative Gastrointestinal: negative Genitourinary:negative Integument/breast: negative Hematologic/lymphatic: negative Musculoskeletal:negative Neurological: negative Behavioral/Psych: positive for stress while wedding planning Endocrine: negative Allergic/Immunologic: positive for allergic rhinitis    General Healthcare: Medication Compliance: No, not taking prozac consistently  Dx Hypertension: No  Dx Hyperlipidemia: Yes, she said that in the past she had mildly high cholesterol Diabetes: No Dx Obesity: No Weight Loss: No Physical Activity: No Urinary Incontinence: No Menstrual cycle: every month, 4-5 days, only 2 tampons a during periods Birth Control: condoms  Social:  reports that she has never smoked. She has never used smokeless tobacco. Driving: Drives a car, wears seatbelt  Alcohol Use: Yes, 1 glass of wine with dinner a few times a week Tobacco: No   Other Drugs: No  Support and Life at Home: Yes, lives with her fiance  Advanced Directives: No Work: Network engineer job  Cancer:  Colorectal >> Colonoscopy: N/A Lung >> Tobacco Use: N/A  Breast >> Mammogram: No. No hx of BC in family    Cervical/Endometrial >> - Postmenopausal: No  - Hysterectomy: No - Vaginal Bleeding: Only during menstrual periods.  Skin >> Suspicious lesions: No  Other: Osteoporosis: No TDAP: due Zoster Vaccine: n/a  Pneumonia Vaccine: n/a  Past Medical History Patient Active Problem List   Diagnosis Date Noted  . Antithrombin deficiency ([place]) 07/14/2016  . Depression with anxiety 12/17/2015  Antithrombin Deficiency- had a PE (2008) and DVT (1996), took coumadin for 1 year. She saw a hematologist at the time.   Surgery History - 1st rib removed in 1996  Medications- reviewed and updated Current Outpatient Prescriptions  Medication Sig Dispense Refill  . FLUoxetine (PROZAC) 40 MG capsule Take 40 mg by mouth daily.    Marland Kitchen LORazepam (ATIVAN) 0.5 MG tablet   0   No current facility-administered medications for this visit.   Last time she took a prozac was about a week ago.   Objective: BP 122/72 (BP Location: Right Arm, Patient Position: Sitting, Cuff Size: Normal)   Pulse 90   Temp 98.8 F (37.1 C) (Oral)   Resp 17   Ht 5\' 5"  (1.651 m)   Wt 155 lb (70.3 kg)   LMP 06/23/2016 (Approximate)   SpO2 98%   BMI 25.79 kg/m  Gen: In no acute distress, alert, cooperative with exam, well groomed HEENT: NCAT, EOMI, PERRL CV: Regular rate and rhythm, normal S1/S2, no murmur Resp: Clear to auscultation bilaterally, no wheezes, non-labored Abd: Soft, Non Tender, Non Distended, bowel sounds present, no guarding or organomegaly Ext: No edema, warm and well perfused Neuro: Alert and oriented, No gross deficits, normal gait Psych: Normal mood and affect   Assessment/Plan: Encounter for annual physical exam: Patient doing well.  -Flu  and TDAP given today.   Concern for weight gain and high cholesterol: Likely due to poor nutrition and no exercise - Discussed avoiding fatty foods and fried foods. Patient brought in printed lab report that she obtained from work which showed cholesterol at 215.   - Will try to increase activity level - Will order TSH, CBC, BMP - Will hold off on statin therapy as patient's ASCVD risk is very low - Consider nutrition referral in the future  - Return in 6 weeks to check Lipid panel after dietary modifications  Hx of DVT, PE, and antithrombin deficiency: No signs of blood clot on exam today. Is not on any anticoagulation. Advised patient to follow up with hematologist.  - Referral to hematologist placed  Hx of anxiety and depression: Patient has been taking prozac PRN when she is stressed while wedding planning. She denies any current anxiety and depression.  - Advised that she not take SSRI PRN - Advised that if she schedule a follow up appt if she starts having anxiety or depression symptoms   Orders Placed This Encounter  Procedures  . Flu Vaccine QUAD 36+ mos IM  . Tdap vaccine greater than or equal to 7yo IM  . CBC with Differential  . TSH  . Basic metabolic panel    Standing Status:   Future    Number of Occurrences:   1    Standing Expiration Date:   08/13/2016  . Ambulatory referral to Hematology    Referral Priority:   Routine    Referral Type:   Consultation    Referral Reason:   Specialty Services Required    Requested Specialty:   Oncology    Number of Visits Requested:   1    No orders of the defined types were placed in this encounter.    Smitty Cords, MD [facility], PGY-2

## 2016-07-14 NOTE — Patient Instructions (Addendum)
Thank you for coming in today, it was so nice to see you! Today we talked about:    Cholesterol: I think it would be a good idea to try diet modification: decrease fried and fatty foods (easier said than done) but I believe you can do it one step at a time. Some people have seen a great improvement in cholesterol with a mediterranean diet. I will attach the details of this below. Come back in 6 weeks and we can recheck your cholesterol  Antithrombin deficiency: I have placed a referral for you to see a hematologist. Someone will call you to schedule this appointment  Anxiety: If you feel like your anxiety is not under control please come back and see [place] ASAP.   Vaccines: We have given you a vaccine for TDAP and flu  Sincerely,  Smitty Cords, MD    [place] refers to food and lifestyle choices that are based on the traditions of countries located on the [employer]. This way of eating has been shown to help prevent certain conditions and improve outcomes for people who have chronic diseases, like kidney disease and heart disease. What are tips for following this plan? Lifestyle  Cook and eat meals together with your family, when possible.  Drink enough fluid to keep your urine clear or pale yellow.  Be physically active every day. This includes:  Aerobic exercise like running or swimming.  Leisure activities like gardening, walking, or housework.  Get 7-8 hours of sleep each night.  If recommended by your health care provider, drink red wine in moderation. This means 1 glass a day for nonpregnant women and 2 glasses a day for men. A glass of wine equals 5 oz (150 mL). Reading food labels  Check the serving size of packaged foods. For foods such as rice and pasta, the serving size refers to the amount of cooked product, not dry.  Check the total fat in packaged foods. Avoid foods that have saturated fat or trans fats.  Check the  ingredients list for added sugars, such as corn syrup. Shopping  At the grocery store, buy most of your food from the areas near the walls of the store. This includes:  Fresh fruits and vegetables (produce).  Grains, beans, nuts, and seeds. Some of these may be available in unpackaged forms or large amounts (in bulk).  Fresh seafood.  Poultry and eggs.  Low-fat dairy products.  Buy whole ingredients instead of prepackaged foods.  Buy fresh fruits and vegetables in-season from local farmers markets.  Buy frozen fruits and vegetables in resealable bags.  If you do not have access to quality fresh seafood, buy precooked frozen shrimp or canned fish, such as tuna, salmon, or sardines.  Buy small amounts of raw or cooked vegetables, salads, or olives from the deli or salad bar at your store.  Stock your pantry so you always have certain foods on hand, such as olive oil, canned tuna, canned tomatoes, rice, pasta, and beans. Cooking  Cook foods with extra-virgin olive oil instead of using butter or other vegetable oils.  Have meat as a side dish, and have vegetables or grains as your main dish. This means having meat in small portions or adding small amounts of meat to foods like pasta or stew.  Use beans or vegetables instead of meat in common dishes like chili or lasagna.  Experiment with different cooking methods. Try roasting or broiling vegetables instead of steaming or sauteing them.  Add frozen vegetables to soups, stews, pasta, or rice.  Add nuts or seeds for added healthy fat at each meal. You can add these to yogurt, salads, or vegetable dishes.  Marinate fish or vegetables using olive oil, lemon juice, garlic, and fresh herbs. Meal planning  Plan to eat 1 vegetarian meal one day each week. Try to work up to 2 vegetarian meals, if possible.  Eat seafood 2 or more times a week.  Have healthy snacks readily available, such as:  Vegetable sticks with hummus.  Greek  yogurt.  Fruit and nut trail mix.  Eat balanced meals throughout the week. This includes:  Fruit: 2-3 servings a day  Vegetables: 4-5 servings a day  Low-fat dairy: 2 servings a day  Fish, poultry, or lean meat: 1 serving a day  Beans and legumes: 2 or more servings a week  Nuts and seeds: 1-2 servings a day  Whole grains: 6-8 servings a day  Extra-virgin olive oil: 3-4 servings a day  Limit red meat and sweets to only a few servings a month What are my food choices?  Mediterranean diet  Recommended  Grains: Whole-grain pasta. Brown rice. Bulgar wheat. Polenta. Couscous. Whole-wheat bread. [place].  Vegetables: Artichokes. Beets. Broccoli. Cabbage. Carrots. Eggplant. Green beans. Chard. Kale. Spinach. Onions. Leeks. Peas. Squash. Tomatoes. Peppers. Radishes.  Fruits: Apples. Apricots. Avocado. Berries. Bananas. Cherries. Dates. Figs. Grapes. Lemons. Melon. Oranges. Peaches. Plums. Pomegranate.  Meats and other protein foods: Beans. Almonds. Sunflower seeds. Pine nuts. Peanuts. [place]. Salmon. Scallops. Shrimp. [place]. Tilapia. Clams. Oysters. Eggs.  Dairy: Low-fat milk. Cheese. Greek yogurt.  Beverages: Water. Red wine. Herbal tea.  Fats and oils: Extra virgin olive oil. Avocado oil. Grape seed oil.  Sweets and desserts: [place] yogurt with honey. Baked apples. Poached pears. Trail mix.  Seasoning and other foods: Basil. Cilantro. Coriander. Cumin. Mint. Parsley. Sage. Rosemary. Tarragon. Garlic. Oregano. Thyme. Pepper. Balsalmic vinegar. Tahini. Hummus. Tomato sauce. Olives. Mushrooms.  Limit these  Grains: Prepackaged pasta or rice dishes. Prepackaged cereal with added sugar.  Vegetables: Deep fried potatoes (french fries).  Fruits: Fruit canned in syrup.  Meats and other protein foods: Beef. Pork. Lamb. Poultry with skin. Hot dogs. Berniece Salines.  Dairy: Ice cream. Sour cream. Whole milk.  Beverages: Juice. Sugar-sweetened soft drinks. Beer. Liquor and  spirits.  Fats and oils: Butter. Canola oil. Vegetable oil. Beef fat (tallow). Lard.  Sweets and desserts: Cookies. Cakes. Pies. Candy.  Seasoning and other foods: Mayonnaise. Premade sauces and marinades.  The items listed may not be a complete list. Talk with your dietitian about what dietary choices are right for you. Summary  The Mediterranean diet includes both food and lifestyle choices.  Eat a variety of fresh fruits and vegetables, beans, nuts, seeds, and whole grains.  Limit the amount of red meat and sweets that you eat.  Talk with your health care provider about whether it is safe for you to drink red wine in moderation. This means 1 glass a day for nonpregnant women and 2 glasses a day for men. A glass of wine equals 5 oz (150 mL). This information is not intended to replace advice given to you by your health care provider. Make sure you discuss any questions you have with your health care provider. Document Released: 01/22/2016 Document Revised: 02/24/2016 Document Reviewed: 01/22/2016 Elsevier Interactive Patient Education  2017 [employer].   IF you received an x-ray today, you will receive an invoice from [facility]. Please contact [facility]iology at [phone]  with questions or concerns regarding your invoice.   IF you received labwork today, you will receive an invoice from [place]. Please contact [facility] at [phone] with questions or concerns regarding your invoice.   Our billing staff will not be able to assist you with questions regarding bills from these companies.  You will be contacted with the lab results as soon as they are available. The fastest way to get your results is to activate your My Chart account. Instructions are located on the last page of this paperwork. If you have not heard from [place] regarding the results in 2 weeks, please contact this office.

## 2016-07-15 LAB — BASIC METABOLIC PANEL
BUN/Creatinine Ratio: 17 (ref 9–23)
BUN: 12 mg/dL (ref 6–20)
CALCIUM: 9.7 mg/dL (ref 8.7–10.2)
CHLORIDE: 101 mmol/L (ref 96–106)
CO2: 20 mmol/L (ref 18–29)
Creatinine, Ser: 0.72 mg/dL (ref 0.57–1.00)
GFR calc Af Amer: 125 mL/min/{1.73_m2} (ref >59)
GFR, EST NON AFRICAN AMERICAN: 109 mL/min/{1.73_m2} (ref >59)
Glucose: 91 mg/dL (ref 65–99)
Potassium: 4.3 mmol/L (ref 3.5–5.2)
Sodium: 139 mmol/L (ref 134–144)

## 2016-07-15 LAB — CBC WITH DIFFERENTIAL/PLATELET
Basophils Absolute: 0 10*3/uL (ref 0.0–0.2)
Basos: 0 %
EOS (ABSOLUTE): 0.1 10*3/uL (ref 0.0–0.4)
EOS: 1 %
Hematocrit: 42 % (ref 34.0–46.6)
Hemoglobin: 14 g/dL (ref 11.1–15.9)
IMMATURE GRANS (ABS): 0 10*3/uL (ref 0.0–0.1)
IMMATURE GRANULOCYTES: 0 %
LYMPHS: 36 %
Lymphocytes Absolute: 3.7 10*3/uL — ABNORMAL HIGH (ref 0.7–3.1)
MCH: 28.7 pg (ref 26.6–33.0)
MCHC: 33.3 g/dL (ref 31.5–35.7)
MCV: 86 fL (ref 79–97)
MONOCYTES: 6 %
Monocytes Absolute: 0.6 10*3/uL (ref 0.1–0.9)
NEUTROS PCT: 57 %
Neutrophils Absolute: 5.9 10*3/uL (ref 1.4–7.0)
PLATELETS: 321 10*3/uL (ref 150–379)
RBC: 4.87 x10E6/uL (ref 3.77–5.28)
RDW: 13.5 % (ref 12.3–15.4)
WBC: 10.4 10*3/uL (ref 3.4–10.8)

## 2016-07-15 LAB — TSH: TSH: 0.716 u[IU]/mL (ref 0.450–4.500)

## 2017-04-28 ENCOUNTER — Encounter: Payer: Self-pay | Admitting: Urgent Care

## 2017-04-28 ENCOUNTER — Other Ambulatory Visit: Payer: Self-pay

## 2017-04-28 ENCOUNTER — Ambulatory Visit: Payer: BLUE CROSS/BLUE SHIELD | Admitting: Urgent Care

## 2017-04-28 VITALS — BP 122/82 | HR 88 | Temp 98.0°F | Resp 16 | Ht 64.96 in | Wt 156.0 lb

## 2017-04-28 DIAGNOSIS — F329 Major depressive disorder, single episode, unspecified: Secondary | ICD-10-CM

## 2017-04-28 DIAGNOSIS — F419 Anxiety disorder, unspecified: Secondary | ICD-10-CM | POA: Diagnosis not present

## 2017-04-28 DIAGNOSIS — G4700 Insomnia, unspecified: Secondary | ICD-10-CM

## 2017-04-28 MED ORDER — FLUOXETINE HCL 20 MG PO CAPS: 20.0000 mg | ORAL_CAPSULE | Freq: Every day | ORAL | 1 refills | Status: DC

## 2017-04-28 MED ORDER — LORAZEPAM 0.5 MG PO TABS: 0.5000 mg | ORAL_TABLET | Freq: Every day | ORAL | 0 refills | Status: DC | PRN

## 2017-04-28 MED ORDER — FLUOXETINE HCL 40 MG PO CAPS: 40.0000 mg | ORAL_CAPSULE | Freq: Every day | ORAL | 1 refills | Status: DC

## 2017-04-28 NOTE — Patient Instructions (Addendum)
For the first 2 weeks, start with 20mg  of fluoxetine daily. If you are not experiencing side effects and are tolerating the medication okay then you may increase this to your previous dose of 40mg  daily. Follow up with me in 6 weeks to make sure the medication is working well for you.    Fluoxetine capsules or tablets (Depression/Mood Disorders) What is this medicine? FLUOXETINE (floo OX e teen) belongs to a class of drugs known as selective serotonin reuptake inhibitors (SSRIs). It helps to treat mood problems such as depression, obsessive compulsive disorder, and panic attacks. It can also treat certain eating disorders. This medicine may be used for other purposes; ask your health care provider or pharmacist if you have questions. COMMON BRAND NAME(S): Prozac What should I tell my health care provider before I take this medicine? They need to know if you have any of these conditions: -bipolar disorder or a family history of bipolar disorder -bleeding disorders -glaucoma -heart disease -liver disease -low levels of sodium in the blood -seizures -suicidal thoughts, plans, or attempt; a previous suicide attempt by you or a family member -take MAOIs like Carbex, Eldepryl, Marplan, Nardil, and Parnate -take medicines that treat or prevent blood clots -thyroid disease -an unusual or allergic reaction to fluoxetine, other medicines, foods, dyes, or preservatives -pregnant or trying to get pregnant -breast-feeding How should I use this medicine? Take this medicine by mouth with a glass of water. Follow the directions on the prescription label. You can take this medicine with or without food. Take your medicine at regular intervals. Do not take it more often than directed. Do not stop taking this medicine suddenly except upon the advice of your doctor. Stopping this medicine too quickly may cause serious side effects or your condition may worsen. A special MedGuide will be given to you by the  pharmacist with each prescription and refill. Be sure to read this information carefully each time. Talk to your pediatrician regarding the use of this medicine in children. While this drug may be prescribed for children as young as 7 years for selected conditions, precautions do apply. Overdosage: If you think you have taken too much of this medicine contact a poison control center or emergency room at once. NOTE: This medicine is only for you. Do not share this medicine with others. What if I miss a dose? If you miss a dose, skip the missed dose and go back to your regular dosing schedule. Do not take double or extra doses. What may interact with this medicine? Do not take this medicine with any of the following medications: -other medicines containing fluoxetine, like Sarafem or Symbyax -cisapride -linezolid -MAOIs like Carbex, Eldepryl, Marplan, Nardil, and Parnate -methylene blue (injected into a vein) -pimozide -thioridazine This medicine may also interact with the following medications: -alcohol -amphetamines -aspirin and aspirin-like medicines -carbamazepine -certain medicines for depression, anxiety, or psychotic disturbances -certain medicines for migraine headaches like almotriptan, eletriptan, frovatriptan, naratriptan, rizatriptan, sumatriptan, zolmitriptan -digoxin -diuretics -fentanyl -flecainide -furazolidone -isoniazid -lithium -medicines for sleep -medicines that treat or prevent blood clots like warfarin, enoxaparin, and dalteparin -NSAIDs, medicines for pain and inflammation, like ibuprofen or naproxen -phenytoin -procarbazine -propafenone -rasagiline -ritonavir -supplements like St. John's wort, kava kava, valerian -tramadol -tryptophan -vinblastine This list may not describe all possible interactions. Give your health care provider a list of all the medicines, herbs, non-prescription drugs, or dietary supplements you use. Also tell them if you smoke,  drink alcohol, or use illegal drugs. Some items may  interact with your medicine. What should I watch for while using this medicine? Tell your doctor if your symptoms do not get better or if they get worse. Visit your doctor or health care professional for regular checks on your progress. Because it may take several weeks to see the full effects of this medicine, it is important to continue your treatment as prescribed by your doctor. Patients and their families should watch out for new or worsening thoughts of suicide or depression. Also watch out for sudden changes in feelings such as feeling anxious, agitated, panicky, irritable, hostile, aggressive, impulsive, severely restless, overly excited and hyperactive, or not being able to sleep. If this happens, especially at the beginning of treatment or after a change in dose, call your health care professional. Dennis Bast may get drowsy or dizzy. Do not drive, use machinery, or do anything that needs mental alertness until you know how this medicine affects you. Do not stand or sit up quickly, especially if you are an older patient. This reduces the risk of dizzy or fainting spells. Alcohol may interfere with the effect of this medicine. Avoid alcoholic drinks. Your mouth may get dry. Chewing sugarless gum or sucking hard candy, and drinking plenty of water may help. Contact your doctor if the problem does not go away or is severe. This medicine may affect blood sugar levels. If you have diabetes, check with your doctor or health care professional before you change your diet or the dose of your diabetic medicine. What side effects may I notice from receiving this medicine? Side effects that you should report to your doctor or health care professional as soon as possible: -allergic reactions like skin rash, itching or hives, swelling of the face, lips, or tongue -anxious -black, tarry stools -breathing problems -changes in vision -confusion -elevated mood,  decreased need for sleep, racing thoughts, impulsive behavior -eye pain -fast, irregular heartbeat -feeling faint or lightheaded, falls -feeling agitated, angry, or irritable -hallucination, loss of contact with reality -loss of balance or coordination -loss of memory -painful or prolonged erections -restlessness, pacing, inability to keep still -seizures -stiff muscles -suicidal thoughts or other mood changes -trouble sleeping -unusual bleeding or bruising -unusually weak or tired -vomiting Side effects that usually do not require medical attention (report to your doctor or health care professional if they continue or are bothersome): -change in appetite or weight -change in sex drive or performance -diarrhea -dry mouth -headache -increased sweating -nausea -tremors This list may not describe all possible side effects. Call your doctor for medical advice about side effects. You may report side effects to FDA at 1-800-FDA-1088. Where should I keep my medicine? Keep out of the reach of children. Store at room temperature between 15 and 30 degrees C (59 and 86 degrees F). Throw away any unused medicine after the expiration date. NOTE: This sheet is a summary. It may not cover all possible information. If you have questions about this medicine, talk to your doctor, pharmacist, or health care provider.  2018 Elsevier/Gold Standard (2015-11-01 15:55:27)    Lorazepam tablets What is this medicine? LORAZEPAM (lor A ze pam) is a benzodiazepine. It is used to treat anxiety. This medicine may be used for other purposes; ask your health care provider or pharmacist if you have questions. COMMON BRAND NAME(S): Ativan What should I tell my health care provider before I take this medicine? They need to know if you have any of these conditions: -glaucoma -history of drug or alcohol abuse problem -kidney disease -  liver disease -lung or breathing disease, like asthma -mental  illness -myasthenia gravis -Parkinson's disease -suicidal thoughts, plans, or attempt; a previous suicide attempt by you or a family member -an unusual or allergic reaction to lorazepam, other medicines, foods, dyes, or preservatives -pregnant or trying to get pregnant -breast-feeding How should I use this medicine? Take this medicine by mouth with a glass of water. Follow the directions on the prescription label. Take your medicine at regular intervals. Do not take it more often than directed. Do not stop taking except on your doctor's advice. A special MedGuide will be given to you by the pharmacist with each prescription and refill. Be sure to read this information carefully each time. Talk to your pediatrician regarding the use of this medicine in children. While this drug may be used in children as young as 12 years for selected conditions, precautions do apply. Overdosage: If you think you have taken too much of this medicine contact a poison control center or emergency room at once. NOTE: This medicine is only for you. Do not share this medicine with others. What if I miss a dose? If you miss a dose, take it as soon as you can. If it is almost time for your next dose, take only that dose. Do not take double or extra doses. What may interact with this medicine? Do not take this medicine with any of the following medications: -narcotic medicines for cough -sodium oxybate This medicine may also interact with the following medications: -alcohol -antihistamines for allergy, cough and cold -certain medicines for anxiety or sleep -certain medicines for depression, like amitriptyline, fluoxetine, sertraline -certain medicines for seizures like carbamazepine, phenobarbital, phenytoin, primidone -general anesthetics like lidocaine, pramoxine, tetracaine -MAOIs like Carbex, Eldepryl, Marplan, Nardil, and Parnate -medicines that relax muscles for surgery -narcotic medicines for  pain -phenothiazines like chlorpromazine, mesoridazine, prochlorperazine, thioridazine This list may not describe all possible interactions. Give your health care provider a list of all the medicines, herbs, non-prescription drugs, or dietary supplements you use. Also tell them if you smoke, drink alcohol, or use illegal drugs. Some items may interact with your medicine. What should I watch for while using this medicine? Tell your doctor or health care professional if your symptoms do not start to get better or if they get worse. Do not stop taking except on your doctor's advice. You may develop a severe reaction. Your doctor will tell you how much medicine to take. You may get drowsy or dizzy. Do not drive, use machinery, or do anything that needs mental alertness until you know how this medicine affects you. To reduce the risk of dizzy and fainting spells, do not stand or sit up quickly, especially if you are an older patient. Alcohol may increase dizziness and drowsiness. Avoid alcoholic drinks. If you are taking another medicine that also causes drowsiness, you may have more side effects. Give your health care provider a list of all medicines you use. Your doctor will tell you how much medicine to take. Do not take more medicine than directed. Call emergency for help if you have problems breathing or unusual sleepiness. What side effects may I notice from receiving this medicine? Side effects that you should report to your doctor or health care professional as soon as possible: -allergic reactions like skin rash, itching or hives, swelling of the face, lips, or tongue -breathing problems -confusion -loss of balance or coordination -signs and symptoms of low blood pressure like dizziness; feeling faint or lightheaded, falls; unusually  weak or tired -suicidal thoughts or other mood changes Side effects that usually do not require medical attention (report to your doctor or health care professional  if they continue or are bothersome): -dizziness -headache -nausea, vomiting -tiredness This list may not describe all possible side effects. Call your doctor for medical advice about side effects. You may report side effects to FDA at 1-800-FDA-1088. Where should I keep my medicine? Keep out of the reach of children. This medicine can be abused. Keep your medicine in a safe place to protect it from theft. Do not share this medicine with anyone. Selling or giving away this medicine is dangerous and against the law. This medicine may cause accidental overdose and death if taken by other adults, children, or pets. Mix any unused medicine with a substance like cat litter or coffee grounds. Then throw the medicine away in a sealed container like a sealed bag or a coffee can with a lid. Do not use the medicine after the expiration date. Store at room temperature between 20 and 25 degrees C (68 and 77 degrees F). Protect from light. Keep container tightly closed. NOTE: This sheet is a summary. It may not cover all possible information. If you have questions about this medicine, talk to your doctor, pharmacist, or health care provider.  2018 Elsevier/Gold Standard (2015-02-27 15:54:27)    IF you received an x-ray today, you will receive an invoice from [facility]. Please contact [facility] at [phone] with questions or concerns regarding your invoice.   IF you received labwork today, you will receive an invoice from [facility]. Please contact [facility] at [phone] with questions or concerns regarding your invoice.   Our billing staff will not be able to assist you with questions regarding bills from these companies.  You will be contacted with the lab results as soon as they are available. The fastest way to get your results is to activate your My Chart account. Instructions are located on the last page of this paperwork. If you have not heard from [place] regarding the results in 2  weeks, please contact this office.

## 2017-04-28 NOTE — Progress Notes (Signed)
   MRN: [id number] DOB: April 23, 1981  Subjective:   Sherry Ray is a 36 y.o. female presenting for follow up on anxiety and depression. She was previously on fluoxetine and lorazepam as needed. She's done very well with this, stopped taking it a year ago because she got to a really good place. She has since married, has recently come back from her honeymoon and has a lot on her plate currently. She has been stressing out a lot lately with her work. Also, feels stressed with the holidays coming up. Has a difficult time sleeping, staying asleep, wakes up feeling panicked. Admits that she is consider pregnancy in the near future and would like to have this considered with her medications. Denies SI, HI.   Sherry Ray is not currently taking any medications and has No Known Allergies.  Sherry Ray  has a past medical history of Antithrombin deficiency ([place]), Anxiety and depression, DVT (deep venous thrombosis) ([place]), and Pulmonary embolism ([place]). Also  has a past surgical history that includes First rib removal.  Objective:   Vitals: BP 122/82   Pulse 88   Temp 98 F (36.7 C) (Oral)   Resp 16   Ht 5' 4.96" (1.65 m)   Wt 156 lb (70.8 kg)   LMP 04/14/2017 (Approximate)   SpO2 99%   BMI 25.99 kg/m   Physical Exam  Constitutional: She is oriented to person, place, and time. She appears well-developed and well-nourished.  Cardiovascular: Normal rate.  Pulmonary/Chest: Effort normal.  Neurological: She is alert and oriented to person, place, and time.  Psychiatric: She has a normal mood and affect.   Assessment and Plan :   1. Anxiety and depression 2. Insomnia, unspecified type - Restart fluoxetine, lorazepam. Titration instructions provided. Counseled patient on potential for adverse effects with medications prescribed today, patient verbalized understanding. She plans on potentially holding off on her pregnancy until she can be steady on her dosing of lorazepam. Follow up in 6 weeks or  sooner if problems arise.   Jaynee Eagles, PA-C [facility] [phone] 04/28/2017  6:01 PM

## 2017-04-29 LAB — COMPREHENSIVE METABOLIC PANEL
A/G RATIO: 1.6 (ref 1.2–2.2)
ALBUMIN: 4.4 g/dL (ref 3.5–5.5)
ALK PHOS: 82 IU/L (ref 39–117)
ALT: 26 IU/L (ref 0–32)
AST: 22 IU/L (ref 0–40)
BUN/Creatinine Ratio: 16 (ref 9–23)
BUN: 12 mg/dL (ref 6–20)
CO2: 23 mmol/L (ref 20–29)
CREATININE: 0.74 mg/dL (ref 0.57–1.00)
Calcium: 9 mg/dL (ref 8.7–10.2)
Chloride: 101 mmol/L (ref 96–106)
GFR calc Af Amer: 121 mL/min/{1.73_m2} (ref >59)
GFR calc non Af Amer: 105 mL/min/{1.73_m2} (ref >59)
GLOBULIN, TOTAL: 2.8 g/dL (ref 1.5–4.5)
Glucose: 65 mg/dL (ref 65–99)
POTASSIUM: 4.2 mmol/L (ref 3.5–5.2)
SODIUM: 141 mmol/L (ref 134–144)
Total Protein: 7.2 g/dL (ref 6.0–8.5)

## 2017-05-27 ENCOUNTER — Ambulatory Visit: Payer: BLUE CROSS/BLUE SHIELD | Admitting: Urgent Care

## 2017-06-03 LAB — OB RESULTS CONSOLE RUBELLA ANTIBODY, IGM: Rubella: IMMUNE

## 2017-06-03 LAB — OB RESULTS CONSOLE HGB/HCT, BLOOD
HEMATOCRIT: 41
HEMOGLOBIN: 13.5

## 2017-06-03 LAB — OB RESULTS CONSOLE HIV ANTIBODY (ROUTINE TESTING): HIV: NONREACTIVE

## 2017-06-03 LAB — OB RESULTS CONSOLE ABO/RH: RH Type: POSITIVE

## 2017-06-03 LAB — OB RESULTS CONSOLE PLATELET COUNT: Platelets: 314

## 2017-06-03 LAB — OB RESULTS CONSOLE RPR: RPR: NONREACTIVE

## 2017-06-03 LAB — OB RESULTS CONSOLE HEPATITIS B SURFACE ANTIGEN: HEP B S AG: NEGATIVE

## 2017-06-03 LAB — OB RESULTS CONSOLE ANTIBODY SCREEN: ANTIBODY SCREEN: NEGATIVE

## 2017-06-16 ENCOUNTER — Ambulatory Visit (INDEPENDENT_AMBULATORY_CARE_PROVIDER_SITE_OTHER): Payer: 59 | Admitting: Urgent Care

## 2017-06-16 ENCOUNTER — Encounter: Payer: Self-pay | Admitting: Urgent Care

## 2017-06-16 VITALS — BP 130/88 | HR 101 | Temp 98.5°F | Resp 18 | Ht 64.0 in | Wt 156.8 lb

## 2017-06-16 DIAGNOSIS — F329 Major depressive disorder, single episode, unspecified: Secondary | ICD-10-CM | POA: Diagnosis not present

## 2017-06-16 DIAGNOSIS — D6859 Other primary thrombophilia: Secondary | ICD-10-CM

## 2017-06-16 DIAGNOSIS — Z23 Encounter for immunization: Secondary | ICD-10-CM

## 2017-06-16 DIAGNOSIS — Z3491 Encounter for supervision of normal pregnancy, unspecified, first trimester: Secondary | ICD-10-CM

## 2017-06-16 DIAGNOSIS — F419 Anxiety disorder, unspecified: Secondary | ICD-10-CM | POA: Diagnosis not present

## 2017-06-16 MED ORDER — FLUOXETINE HCL 20 MG PO CAPS: 20.0000 mg | ORAL_CAPSULE | Freq: Every day | ORAL | 1 refills | Status: DC

## 2017-06-16 NOTE — Patient Instructions (Addendum)
[facility] [place], [place] [address]  Horton Finer [phone]  Everardo Beals [phone]   [facility] ([address] [place]) - [phone]  [facility] [place]) - [place] [facility] - [phone]  [facility] [place] - [phone]  [facility]  - [phone] (do not file insurance)       First Trimester of Pregnancy The first trimester of pregnancy is from week 1 until the end of week 13 (months 1 through 3). A week after a sperm fertilizes an egg, the egg will implant on the wall of the uterus. This embryo will begin to develop into a baby. Genes from you and your partner will form the baby. The female genes will determine whether the baby will be a boy or a girl. At 6-8 weeks, the eyes and face will be formed, and the heartbeat can be seen on ultrasound. At the end of 12 weeks, all the baby's organs will be formed. Now that you are pregnant, you will want to do everything you can to have a healthy baby. Two of the most important things are to get good prenatal care and to follow your health care provider's instructions. Prenatal care is all the medical care you receive before the baby's birth. This care will help prevent, find, and treat any problems during the pregnancy and childbirth. Body changes during your first trimester Your body goes through many changes during pregnancy. The changes vary from woman to woman.  You may gain or lose a couple of pounds at first.  You may feel sick to your stomach (nauseous) and you may throw up (vomit). If the vomiting is uncontrollable, call your health care provider.  You may tire easily.  You may develop headaches that can be relieved by medicines. All medicines should be approved by your health care provider.  You may  urinate more often. Painful urination may mean you have a bladder infection.  You may develop heartburn as a result of your pregnancy.  You may develop constipation because certain hormones are causing the muscles that push stool through your intestines to slow down.  You may develop hemorrhoids or swollen veins (varicose veins).  Your breasts may begin to grow larger and become tender. Your nipples may stick out more, and the tissue that surrounds them (areola) may become darker.  Your gums may bleed and may be sensitive to brushing and flossing.  Dark spots or blotches (chloasma, mask of pregnancy) may develop on your face. This will likely fade after the baby is born.  Your menstrual periods will stop.  You may have a loss of appetite.  You may develop cravings for certain kinds of food.  You may have changes in your emotions from day to day, such as being excited to be pregnant or being concerned that something may go wrong with the pregnancy and baby.  You may have more vivid and strange dreams.  You may have changes in your hair. These can include thickening of your hair, rapid growth, and changes in texture. Some women also have hair loss during or after pregnancy, or hair that feels dry or thin. Your hair will most likely return to normal after your baby is born.  What to expect at prenatal visits During a routine prenatal visit:  You will be weighed to make sure you and the baby are growing normally.  Your  blood pressure will be taken.  Your abdomen will be measured to track your baby's growth.  The fetal heartbeat will be listened to between weeks 10 and 14 of your pregnancy.  Test results from any previous visits will be discussed.  Your health care provider may ask you:  How you are feeling.  If you are feeling the baby move.  If you have had any abnormal symptoms, such as leaking fluid, bleeding, severe headaches, or abdominal cramping.  If you are using  any tobacco products, including cigarettes, chewing tobacco, and electronic cigarettes.  If you have any questions.  Other tests that may be performed during your first trimester include:  Blood tests to find your blood type and to check for the presence of any previous infections. The tests will also be used to check for low iron levels (anemia) and protein on red blood cells (Rh antibodies). Depending on your risk factors, or if you previously had diabetes during pregnancy, you may have tests to check for high blood sugar that affects pregnant women (gestational diabetes).  Urine tests to check for infections, diabetes, or protein in the urine.  An ultrasound to confirm the proper growth and development of the baby.  Fetal screens for spinal cord problems (spina bifida) and Down syndrome.  HIV (human immunodeficiency virus) testing. Routine prenatal testing includes screening for HIV, unless you choose not to have this test.  You may need other tests to make sure you and the baby are doing well.  Follow these instructions at home: Medicines  Follow your health care provider's instructions regarding medicine use. Specific medicines may be either safe or unsafe to take during pregnancy.  Take a prenatal vitamin that contains at least 600 micrograms (mcg) of folic acid.  If you develop constipation, try taking a stool softener if your health care provider approves. Eating and drinking  Eat a balanced diet that includes fresh fruits and vegetables, whole grains, good sources of protein such as meat, eggs, or tofu, and low-fat dairy. Your health care provider will help you determine the amount of weight gain that is right for you.  Avoid raw meat and uncooked cheese. These carry germs that can cause birth defects in the baby.  Eating four or five small meals rather than three large meals a day may help relieve nausea and vomiting. If you start to feel nauseous, eating a few soda crackers  can be helpful. Drinking liquids between meals, instead of during meals, also seems to help ease nausea and vomiting.  Limit foods that are high in fat and processed sugars, such as fried and sweet foods.  To prevent constipation: ? Eat foods that are high in fiber, such as fresh fruits and vegetables, whole grains, and beans. ? Drink enough fluid to keep your urine clear or pale yellow. Activity  Exercise only as directed by your health care provider. Most women can continue their usual exercise routine during pregnancy. Try to exercise for 30 minutes at least 5 days a week. Exercising will help you: ? Control your weight. ? Stay in shape. ? Be prepared for labor and delivery.  Experiencing pain or cramping in the lower abdomen or lower back is a good sign that you should stop exercising. Check with your health care provider before continuing with normal exercises.  Try to avoid standing for long periods of time. Move your legs often if you must stand in one place for a long time.  Avoid heavy lifting.  Wear  low-heeled shoes and practice good posture.  You may continue to have sex unless your health care provider tells you not to. Relieving pain and discomfort  Wear a good support bra to relieve breast tenderness.  Take warm sitz baths to soothe any pain or discomfort caused by hemorrhoids. Use hemorrhoid cream if your health care provider approves.  Rest with your legs elevated if you have leg cramps or low back pain.  If you develop varicose veins in your legs, wear support hose. Elevate your feet for 15 minutes, 3-4 times a day. Limit salt in your diet. Prenatal care  Schedule your prenatal visits by the twelfth week of pregnancy. They are usually scheduled monthly at first, then more often in the last 2 months before delivery.  Write down your questions. Take them to your prenatal visits.  Keep all your prenatal visits as told by your health care provider. This is  important. Safety  Wear your seat belt at all times when driving.  Make a list of emergency phone numbers, including numbers for family, friends, the hospital, and police and fire departments. General instructions  Ask your health care provider for a referral to a local prenatal education class. Begin classes no later than the beginning of month 6 of your pregnancy.  Ask for help if you have counseling or nutritional needs during pregnancy. Your health care provider can offer advice or refer you to specialists for help with various needs.  Do not use hot tubs, steam rooms, or saunas.  Do not douche or use tampons or scented sanitary pads.  Do not cross your legs for long periods of time.  Avoid cat litter boxes and soil used by cats. These carry germs that can cause birth defects in the baby and possibly loss of the fetus by miscarriage or stillbirth.  Avoid all smoking, herbs, alcohol, and medicines not prescribed by your health care provider. Chemicals in these products affect the formation and growth of the baby.  Do not use any products that contain nicotine or tobacco, such as cigarettes and e-cigarettes. If you need help quitting, ask your health care provider. You may receive counseling support and other resources to help you quit.  Schedule a dentist appointment. At home, brush your teeth with a soft toothbrush and be gentle when you floss. Contact a health care provider if:  You have dizziness.  You have mild pelvic cramps, pelvic pressure, or nagging pain in the abdominal area.  You have persistent nausea, vomiting, or diarrhea.  You have a bad smelling vaginal discharge.  You have pain when you urinate.  You notice increased swelling in your face, hands, legs, or ankles.  You are exposed to fifth disease or chickenpox.  You are exposed to [place] measles (rubella) and have never had it. Get help right away if:  You have a fever.  You are leaking fluid from your  vagina.  You have spotting or bleeding from your vagina.  You have severe abdominal cramping or pain.  You have rapid weight gain or loss.  You vomit blood or material that looks like coffee grounds.  You develop a severe headache.  You have shortness of breath.  You have any kind of trauma, such as from a fall or a car accident. Summary  The first trimester of pregnancy is from week 1 until the end of week 13 (months 1 through 3).  Your body goes through many changes during pregnancy. The changes vary from woman to woman.  You will have routine prenatal visits. During those visits, your health care provider will examine you, discuss any test results you may have, and talk with you about how you are feeling. This information is not intended to replace advice given to you by your health care provider. Make sure you discuss any questions you have with your health care provider. Document Released: 05/25/2001 Document Revised: 05/12/2016 Document Reviewed: 05/12/2016 Elsevier Interactive Patient Education  2018 [employer].      IF you received an x-ray today, you will receive an invoice from [facility]. Please contact [facility] at [phone] with questions or concerns regarding your invoice.   IF you received labwork today, you will receive an invoice from [facility]. Please contact [facility] at [phone] with questions or concerns regarding your invoice.   Our billing staff will not be able to assist you with questions regarding bills from these companies.  You will be contacted with the lab results as soon as they are available. The fastest way to get your results is to activate your My Chart account. Instructions are located on the last page of this paperwork. If you have not heard from [place] regarding the results in 2 weeks, please contact this office.

## 2017-06-16 NOTE — Progress Notes (Signed)
    MRN: [id number] DOB: 05-Nov-1980  Subjective:   Sherry Ray is a 37 y.o. female presenting for follow up on anxiety and depression. Patient works in Nutritional therapist, support and Armed forces technical officer. She had a heavy work burden when we last had her office visit on 04/28/2017. This has improved and she has done well with Prozac at 40mg . However, she is now [redacted] weeks pregnant, has a history of antithrombin deficiency and is being managed by a specialist for this with enoxaparin, aspirin. She would like to decrease her Prozac to 20mg  to see if she does well with this dose. Denies SI, HI. Has good relationships at home, has a good support network. Denies smoking cigarettes or drinking alcohol.   Sherry Ray has a current medication list which includes the following prescription(s): aspirin, cetirizine, enoxaparin, fluoxetine, and prenatal vit-fe fumarate-fa. Also has No Known Allergies.  Sherry Ray  has a past medical history of Antithrombin deficiency ([place]), Anxiety and depression, DVT (deep venous thrombosis) ([place]), and Pulmonary embolism ([place]). Also  has a past surgical history that includes First rib removal.  Objective:   Vitals: BP 130/88   Pulse (!) 101   Temp 98.5 F (36.9 C) (Oral)   Resp 18   Ht 5\' 4"  (1.626 m)   Wt 156 lb 12.8 oz (71.1 kg)   LMP 04/14/2017 (Approximate)   SpO2 98%   BMI 26.91 kg/m   BP Readings from Last 3 Encounters:  06/16/17 130/88  04/28/17 122/82  07/14/16 122/72   Physical Exam  Constitutional: She is oriented to person, place, and time. She appears well-developed and well-nourished.  Cardiovascular: Normal rate.  Pulmonary/Chest: Effort normal.  Neurological: She is alert and oriented to person, place, and time.  Skin: Skin is warm and dry.  Psychiatric: She expresses no homicidal and no suicidal ideation.  Anxious demeanor but cheerful affect.   Assessment and Plan :   Anxiety and depression  First trimester pregnancy  Antithrombin deficiency  ([place])  Need for influenza vaccination - Plan: Flu Vaccine QUAD 6+ mos PF IM (Fluarix Quad PF)   Will decrease from 40mg  to 20mg  Prozac. Return-to-clinic precautions discussed, patient verbalized understanding. Flu vaccine updated today.  Jaynee Eagles, PA-C [facility] [phone] 06/16/2017  6:07 PM

## 2017-06-21 DIAGNOSIS — D6859 Other primary thrombophilia: Secondary | ICD-10-CM | POA: Diagnosis not present

## 2017-06-23 DIAGNOSIS — D6859 Other primary thrombophilia: Secondary | ICD-10-CM | POA: Diagnosis not present

## 2017-06-27 DIAGNOSIS — D6859 Other primary thrombophilia: Secondary | ICD-10-CM | POA: Diagnosis not present

## 2017-06-29 DIAGNOSIS — O09521 Supervision of elderly multigravida, first trimester: Secondary | ICD-10-CM | POA: Diagnosis not present

## 2017-06-29 DIAGNOSIS — Z3A01 Less than 8 weeks gestation of pregnancy: Secondary | ICD-10-CM | POA: Diagnosis not present

## 2017-06-29 DIAGNOSIS — Z8669 Personal history of other diseases of the nervous system and sense organs: Secondary | ICD-10-CM | POA: Diagnosis not present

## 2017-06-30 DIAGNOSIS — M79605 Pain in left leg: Secondary | ICD-10-CM | POA: Diagnosis not present

## 2017-07-07 DIAGNOSIS — Z3480 Encounter for supervision of other normal pregnancy, unspecified trimester: Secondary | ICD-10-CM | POA: Diagnosis not present

## 2017-07-08 ENCOUNTER — Other Ambulatory Visit (HOSPITAL_COMMUNITY)
Admission: AD | Admit: 2017-07-08 | Discharge: 2017-07-08 | Disposition: A | Discharge status: Home / Self Care | Payer: 59 | Source: Ambulatory Visit | Attending: Obstetrics and Gynecology | Admitting: Obstetrics and Gynecology

## 2017-07-08 DIAGNOSIS — D6859 Other primary thrombophilia: Secondary | ICD-10-CM | POA: Insufficient documentation

## 2017-07-08 LAB — CBC
HEMATOCRIT: 37.6 % (ref 36.0–46.0)
Hemoglobin: 12.9 g/dL (ref 12.0–15.0)
MCH: 29.3 pg (ref 26.0–34.0)
MCHC: 34.3 g/dL (ref 30.0–36.0)
MCV: 85.5 fL (ref 78.0–100.0)
PLATELETS: 300 10*3/uL (ref 150–400)
RBC: 4.4 MIL/uL (ref 3.87–5.11)
RDW: 13.5 % (ref 11.5–15.5)
WBC: 14.9 10*3/uL — AB (ref 4.0–10.5)

## 2017-07-08 LAB — HEPARIN LEVEL (UNFRACTIONATED): Heparin Unfractionated: 0.49 IU/mL (ref 0.30–0.70)

## 2017-07-12 ENCOUNTER — Encounter: Payer: Self-pay | Admitting: Hematology and Oncology

## 2017-07-14 ENCOUNTER — Other Ambulatory Visit (HOSPITAL_COMMUNITY)
Admission: AD | Admit: 2017-07-14 | Discharge: 2017-07-14 | Disposition: A | Discharge status: Home / Self Care | Payer: 59 | Source: Ambulatory Visit | Attending: Obstetrics and Gynecology | Admitting: Obstetrics and Gynecology

## 2017-07-14 DIAGNOSIS — D689 Coagulation defect, unspecified: Secondary | ICD-10-CM | POA: Diagnosis present

## 2017-07-14 LAB — CBC
HCT: 38.3 % (ref 36.0–46.0)
HEMOGLOBIN: 12.7 g/dL (ref 12.0–15.0)
MCH: 28.7 pg (ref 26.0–34.0)
MCHC: 33.2 g/dL (ref 30.0–36.0)
MCV: 86.5 fL (ref 78.0–100.0)
Platelets: 277 10*3/uL (ref 150–400)
RBC: 4.43 MIL/uL (ref 3.87–5.11)
RDW: 13.2 % (ref 11.5–15.5)
WBC: 15.2 10*3/uL — ABNORMAL HIGH (ref 4.0–10.5)

## 2017-07-14 LAB — HEPARIN LEVEL (UNFRACTIONATED): HEPARIN UNFRACTIONATED: 0.64 [IU]/mL (ref 0.30–0.70)

## 2017-07-19 DIAGNOSIS — O09512 Supervision of elderly primigravida, second trimester: Secondary | ICD-10-CM | POA: Diagnosis not present

## 2017-07-20 ENCOUNTER — Other Ambulatory Visit: Payer: Self-pay

## 2017-07-21 ENCOUNTER — Other Ambulatory Visit (HOSPITAL_COMMUNITY)
Admission: RE | Admit: 2017-07-21 | Discharge: 2017-07-21 | Disposition: A | Discharge status: Home / Self Care | Payer: 59 | Source: Ambulatory Visit | Attending: Obstetrics and Gynecology | Admitting: Obstetrics and Gynecology

## 2017-07-21 DIAGNOSIS — D689 Coagulation defect, unspecified: Secondary | ICD-10-CM | POA: Diagnosis not present

## 2017-07-21 LAB — CBC
HEMATOCRIT: 37.2 % (ref 36.0–46.0)
HEMOGLOBIN: 12.2 g/dL (ref 12.0–15.0)
MCH: 28.4 pg (ref 26.0–34.0)
MCHC: 32.8 g/dL (ref 30.0–36.0)
MCV: 86.5 fL (ref 78.0–100.0)
Platelets: 272 10*3/uL (ref 150–400)
RBC: 4.3 MIL/uL (ref 3.87–5.11)
RDW: 13.5 % (ref 11.5–15.5)
WBC: 15.9 10*3/uL — ABNORMAL HIGH (ref 4.0–10.5)

## 2017-07-21 LAB — HEPARIN LEVEL (UNFRACTIONATED): Heparin Unfractionated: 0.7 IU/mL (ref 0.30–0.70)

## 2017-07-26 ENCOUNTER — Inpatient Hospital Stay: Payer: 59 | Attending: Hematology and Oncology | Admitting: Hematology and Oncology

## 2017-07-26 DIAGNOSIS — D6859 Other primary thrombophilia: Secondary | ICD-10-CM | POA: Insufficient documentation

## 2017-07-26 DIAGNOSIS — O99112 Other diseases of the blood and blood-forming organs and certain disorders involving the immune mechanism complicating pregnancy, second trimester: Secondary | ICD-10-CM | POA: Diagnosis not present

## 2017-07-26 DIAGNOSIS — Z7901 Long term (current) use of anticoagulants: Secondary | ICD-10-CM | POA: Diagnosis not present

## 2017-07-26 DIAGNOSIS — Z86718 Personal history of other venous thrombosis and embolism: Secondary | ICD-10-CM | POA: Insufficient documentation

## 2017-07-26 DIAGNOSIS — Z86711 Personal history of pulmonary embolism: Secondary | ICD-10-CM | POA: Diagnosis not present

## 2017-07-26 DIAGNOSIS — Z3A15 15 weeks gestation of pregnancy: Secondary | ICD-10-CM | POA: Diagnosis not present

## 2017-07-26 NOTE — Progress Notes (Signed)
[facility] CONSULT NOTE  Patient Care Team: Jaynee Eagles, Hershal Coria as PCP - General (Urgent Care)  CHIEF COMPLAINTS/PURPOSE OF CONSULTATION:  Pregnancy with Antithrombin deficiency  HISTORY OF PRESENTING ILLNESS:  Sherry Ray 37 y.o. female is here because of long-standing history of Antithrombin deficiency.  Patient had history of 2 episodes of blood clots.  Once when she was 37 years old and once in 2008.  The most recent blood pressure in 2008 was bilateral pulmonary emboli as well as lower extremity DVT.  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  She had and Antithrombin levels of 40-50 even after the blood thinners were discontinued.  She had these checked a couple of times since then.  Currently patient is pregnant with 15 weeks of pregnancy.  She was started on Lovenox injections by her maternal fetal medicine specialist at [facility] and by her gynecologist.  The dosage of the Lovenox is being adjusted based upon anti-10 A levels.  Currently she is on 120 mg of Lovenox subcu twice a day. She was sent to [place] for discussion regarding any testing or treatment considerations that will need to be made but given her current situation.  Patient tells me that no other family member has had blood clots.  She has never had any pregnancy losses.  I reviewed her records extensively and collaborated the history with the patient.  MEDICAL HISTORY:  Past Medical History:  Diagnosis Date  . Antithrombin deficiency ([place])    had a PE (2008) and DVT (1996), took coumadin for 1 year  . Anxiety and depression   . DVT (deep venous thrombosis) ([place])   . Pulmonary embolism ([place])     SURGICAL HISTORY: Past Surgical History:  Procedure  Laterality Date  . FIRST RIB REMOVAL      SOCIAL HISTORY: Social History   Socioeconomic History  . Marital status: Married    Spouse name: Not on file  . Number of children: Not on file  . Years of education: Not on file  . Highest education level: Not on file  Social Needs  . Financial resource strain: Not on file  . Food insecurity - worry: Not on file  . Food insecurity - inability: Not on file  . Transportation needs - medical: Not on file  . Transportation needs - non-medical: Not on file  Occupational History  . Not on file  Tobacco Use  . Smoking status: Never Smoker  . Smokeless tobacco: Never Used  Substance and Sexual Activity  . Alcohol use: Yes    Alcohol/week: 0.0 oz  . Drug use: No  . Sexual activity: No  Other Topics Concern  . Not on file  Social History Narrative  . Not on file    FAMILY HISTORY: No family history on file.  ALLERGIES:  has No Known Allergies.  MEDICATIONS:  Current Outpatient Medications  Medication Sig Dispense Refill  . BABY ASPIRIN PO Take 1 tablet by mouth daily.    . cetirizine (ZYRTEC) 10 MG tablet Take 1 tablet by mouth daily.    Marland Kitchen enoxaparin (LOVENOX) 80 MG/0.8ML injection Inject 70 mg into the skin 2 (two) times daily.    Marland Kitchen FLUoxetine (PROZAC) 20 MG capsule Take 1 capsule (20  mg total) by mouth daily. 90 capsule 1  . Prenatal Vit-Fe Fumarate-FA (PRENATAL VITAMIN PO) Take 1 tablet by mouth daily.     No current facility-administered medications for this visit.     REVIEW OF SYSTEMS:   Constitutional: Denies fevers, chills or abnormal night sweats Eyes: Denies blurriness of vision, double vision or watery eyes Ears, nose, mouth, throat, and face: Denies mucositis or sore throat Respiratory: Denies cough, dyspnea or wheezes Cardiovascular: Denies palpitation, chest discomfort or lower extremity swelling Gastrointestinal:  Denies nausea, heartburn or change in bowel habits Skin: Denies abnormal skin rashes Lymphatics:  Denies new lymphadenopathy or easy bruising Neurological:Denies numbness, tingling or new weaknesses Behavioral/Psych: Mood is stable, no new changes   All other systems were reviewed with the patient and are negative.  PHYSICAL EXAMINATION: ECOG PERFORMANCE STATUS: 0 - Asymptomatic  Vitals:   07/26/17 1632  BP: 134/74  Pulse: 95  Resp: 20  Temp: 97.6 F (36.4 C)  SpO2: 100%   Filed Weights   07/26/17 1632  Weight: 160 lb 12.8 oz (72.9 kg)    GENERAL:alert, no distress and comfortable SKIN: skin color, texture, turgor are normal, no rashes or significant lesions EYES: normal, conjunctiva are pink and non-injected, sclera clear OROPHARYNX:no exudate, no erythema and lips, buccal mucosa, and tongue normal  NECK: supple, thyroid normal size, non-tender, without nodularity LYMPH:  no palpable lymphadenopathy in the cervical, axillary or inguinal LUNGS: clear to auscultation and percussion with normal breathing effort HEART: regular rate & rhythm and no murmurs and no lower extremity edema ABDOMEN:abdomen soft, non-tender and normal bowel sounds Musculoskeletal:no cyanosis of digits and no clubbing  PSYCH: alert & oriented x 3 with fluent speech NEURO: no focal motor/sensory deficits  LABORATORY DATA:  I have reviewed the data as listed Lab Results  Component Value Date   WBC 15.9 (H) 07/21/2017   HGB 12.2 07/21/2017   HCT 37.2 07/21/2017   MCV 86.5 07/21/2017   PLT 272 07/21/2017   Lab Results  Component Value Date   NA 141 04/28/2017   K 4.2 04/28/2017   CL 101 04/28/2017   CO2 23 04/28/2017    RADIOGRAPHIC STUDIES: I have personally reviewed the radiological reports and agreed with the findings in the report.  ASSESSMENT AND PLAN:  Antithrombin deficiency ([place]) 02/13/2007 bilateral pulmonary emboli bilateral lower lobes and right middle lobe, right lower lobe pulmonary infarct 3 cm exophytic lesion right scapula with aggressive features question  chondrosarcoma Hypercoagulable workup in 2008 reviewed mild Antithrombin deficiency of 45% Since then patient had Antithrombin levels checked and they had been between 40-50%.  Pregnancy and Antithrombin deficiency: I agree with the current treatment plan of Lovenox injections titrated based upon anti-10 A levels.  Patient fully understands that Lovenox dosage is higher in patients with Antithrombin deficiency primarily because of heparin resistance. So far she appears to be tolerating the injections fairly well.  Around the delivery: If patient needs epidural injections, we may switch her from Lovenox to heparin.  However if the know exactly when she will deliver then Lovenox can be discontinued until the day before the delivery.  Antithrombin replacement products: We also discussed about anti-thrombin products including plasma derived versus recombinant Antithrombin.  These are available and can be used in profound Antithrombin deficiency.  However there are cost considerations as well as continuous dosing requirements for this product. Based on guidelines published in up-to-date, given her lack of family history and only a personal history of VTE, only prophylactic anticoagulation  is recommended.  It is hard to make a case for hereditary Antithrombin deficiency with the lack of family history.  I will see the patient back 1-2 weeks prior to delivery to see if there are any ongoing concerns for anticoagulation. Thank you very much for sending such an interesting patient.  Please do not hesitate to call me if there are any further questions or concerns with dosing for Lovenox.  All questions were answered. The patient knows to call the clinic with any problems, questions or concerns.    Harriette Ohara, MD 07/26/17

## 2017-07-26 NOTE — Assessment & Plan Note (Signed)
02/13/2007 bilateral pulmonary emboli bilateral lower lobes and right middle lobe, right lower lobe pulmonary infarct 3 cm exophytic lesion right scapula with aggressive features question chondrosarcoma Hypercoagulable workup in 2008 reviewed mild Antithrombin deficiency of 45%  I discussed with the patient of the right to primary causes of Antithrombin deficiency.  One is hereditary factors second because of this are nephrotic syndrome or protein losing enteropathy's.  Patient does not have any such symptoms of these conditions.  It could be healed Antithrombin levels were obtained too soon after the blood clot.  If that was the case then, there is no need to do any further treatments. Unit the patient has had auditory Antithrombin deficiency, there is no specific treatment for her condition.

## 2017-07-27 ENCOUNTER — Telehealth: Payer: Self-pay | Admitting: Hematology and Oncology

## 2017-07-27 NOTE — Telephone Encounter (Signed)
Mailed patient calendar of upcoming July appointments

## 2017-07-28 ENCOUNTER — Other Ambulatory Visit (HOSPITAL_COMMUNITY)
Admission: RE | Admit: 2017-07-28 | Discharge: 2017-07-28 | Disposition: A | Discharge status: Home / Self Care | Payer: 59 | Source: Ambulatory Visit | Attending: Obstetrics and Gynecology | Admitting: Obstetrics and Gynecology

## 2017-07-28 DIAGNOSIS — D689 Coagulation defect, unspecified: Secondary | ICD-10-CM | POA: Diagnosis not present

## 2017-07-28 DIAGNOSIS — Z3480 Encounter for supervision of other normal pregnancy, unspecified trimester: Secondary | ICD-10-CM | POA: Diagnosis not present

## 2017-07-28 LAB — CBC
HCT: 35.4 % — ABNORMAL LOW (ref 36.0–46.0)
Hemoglobin: 11.8 g/dL — ABNORMAL LOW (ref 12.0–15.0)
MCH: 28.8 pg (ref 26.0–34.0)
MCHC: 33.3 g/dL (ref 30.0–36.0)
MCV: 86.3 fL (ref 78.0–100.0)
Platelets: 267 10*3/uL (ref 150–400)
RBC: 4.1 MIL/uL (ref 3.87–5.11)
RDW: 13.5 % (ref 11.5–15.5)
WBC: 13.9 10*3/uL — ABNORMAL HIGH (ref 4.0–10.5)

## 2017-07-28 LAB — HEPARIN LEVEL (UNFRACTIONATED): Heparin Unfractionated: 0.67 [IU]/mL (ref 0.30–0.70)

## 2017-08-05 ENCOUNTER — Other Ambulatory Visit (HOSPITAL_COMMUNITY)
Admission: RE | Admit: 2017-08-05 | Discharge: 2017-08-05 | Disposition: A | Discharge status: Home / Self Care | Payer: 59 | Source: Ambulatory Visit | Attending: Obstetrics and Gynecology | Admitting: Obstetrics and Gynecology

## 2017-08-05 DIAGNOSIS — D689 Coagulation defect, unspecified: Secondary | ICD-10-CM | POA: Diagnosis not present

## 2017-08-05 LAB — CBC
HCT: 34.5 % — ABNORMAL LOW (ref 36.0–46.0)
HEMOGLOBIN: 11.6 g/dL — AB (ref 12.0–15.0)
MCH: 29 pg (ref 26.0–34.0)
MCHC: 33.6 g/dL (ref 30.0–36.0)
MCV: 86.3 fL (ref 78.0–100.0)
Platelets: 242 10*3/uL (ref 150–400)
RBC: 4 MIL/uL (ref 3.87–5.11)
RDW: 13.4 % (ref 11.5–15.5)
WBC: 15.1 10*3/uL — ABNORMAL HIGH (ref 4.0–10.5)

## 2017-08-05 LAB — HEPARIN LEVEL (UNFRACTIONATED): HEPARIN UNFRACTIONATED: 0.61 [IU]/mL (ref 0.30–0.70)

## 2017-08-11 ENCOUNTER — Other Ambulatory Visit (HOSPITAL_COMMUNITY)
Admission: RE | Admit: 2017-08-11 | Discharge: 2017-08-11 | Disposition: A | Discharge status: Home / Self Care | Payer: 59 | Source: Ambulatory Visit | Attending: Obstetrics and Gynecology | Admitting: Obstetrics and Gynecology

## 2017-08-11 DIAGNOSIS — D689 Coagulation defect, unspecified: Secondary | ICD-10-CM | POA: Insufficient documentation

## 2017-08-11 LAB — CBC
HCT: 33 % — ABNORMAL LOW (ref 36.0–46.0)
Hemoglobin: 10.9 g/dL — ABNORMAL LOW (ref 12.0–15.0)
MCH: 28.4 pg (ref 26.0–34.0)
MCHC: 33 g/dL (ref 30.0–36.0)
MCV: 85.9 fL (ref 78.0–100.0)
PLATELETS: 257 10*3/uL (ref 150–400)
RBC: 3.84 MIL/uL — ABNORMAL LOW (ref 3.87–5.11)
RDW: 13.5 % (ref 11.5–15.5)
WBC: 16 10*3/uL — ABNORMAL HIGH (ref 4.0–10.5)

## 2017-08-11 LAB — HEPARIN LEVEL (UNFRACTIONATED): HEPARIN UNFRACTIONATED: 0.68 [IU]/mL (ref 0.30–0.70)

## 2017-08-16 DIAGNOSIS — Z3A18 18 weeks gestation of pregnancy: Secondary | ICD-10-CM | POA: Diagnosis not present

## 2017-08-18 ENCOUNTER — Other Ambulatory Visit (HOSPITAL_COMMUNITY)
Admission: RE | Admit: 2017-08-18 | Discharge: 2017-08-18 | Disposition: A | Discharge status: Home / Self Care | Payer: 59 | Source: Ambulatory Visit | Attending: Obstetrics and Gynecology | Admitting: Obstetrics and Gynecology

## 2017-08-18 ENCOUNTER — Encounter (HOSPITAL_COMMUNITY): Payer: Self-pay | Admitting: *Deleted

## 2017-08-18 DIAGNOSIS — D689 Coagulation defect, unspecified: Secondary | ICD-10-CM | POA: Insufficient documentation

## 2017-08-18 LAB — CBC
HCT: 39.5 % (ref 36.0–46.0)
Hemoglobin: 12.9 g/dL (ref 12.0–15.0)
MCH: 28.2 pg (ref 26.0–34.0)
MCHC: 32.7 g/dL (ref 30.0–36.0)
MCV: 86.4 fL (ref 78.0–100.0)
PLATELETS: 261 10*3/uL (ref 150–400)
RBC: 4.57 MIL/uL (ref 3.87–5.11)
RDW: 13.7 % (ref 11.5–15.5)
WBC: 14.3 10*3/uL — AB (ref 4.0–10.5)

## 2017-08-18 LAB — HEPARIN LEVEL (UNFRACTIONATED): HEPARIN UNFRACTIONATED: 0.85 [IU]/mL — AB (ref 0.30–0.70)

## 2017-08-19 ENCOUNTER — Encounter (HOSPITAL_COMMUNITY): Payer: Self-pay

## 2017-08-19 ENCOUNTER — Ambulatory Visit (HOSPITAL_COMMUNITY)
Admission: RE | Admit: 2017-08-19 | Discharge: 2017-08-19 | Disposition: A | Discharge status: Home / Self Care | Payer: 59 | Source: Ambulatory Visit | Attending: Obstetrics and Gynecology | Admitting: Obstetrics and Gynecology

## 2017-08-19 ENCOUNTER — Other Ambulatory Visit: Payer: Self-pay

## 2017-08-19 DIAGNOSIS — O99112 Other diseases of the blood and blood-forming organs and certain disorders involving the immune mechanism complicating pregnancy, second trimester: Secondary | ICD-10-CM | POA: Insufficient documentation

## 2017-08-19 DIAGNOSIS — Z3A18 18 weeks gestation of pregnancy: Secondary | ICD-10-CM | POA: Insufficient documentation

## 2017-08-19 DIAGNOSIS — D6859 Other primary thrombophilia: Secondary | ICD-10-CM | POA: Diagnosis not present

## 2017-08-19 HISTORY — DX: Headache: R51

## 2017-08-19 HISTORY — DX: Unspecified abnormal cytological findings in specimens from vagina: R87.629

## 2017-08-19 HISTORY — DX: Major depressive disorder, single episode, unspecified: F32.9

## 2017-08-19 HISTORY — DX: Depression, unspecified: F32.A

## 2017-08-19 HISTORY — DX: Headache, unspecified: R51.9

## 2017-08-19 NOTE — Consult Note (Signed)
Maternal Fetal Medicine Return Consultation  I had the pleasure of seeing your patient Sherry Ray for Maternal-Fetal Medicine consultation on 08/19/2017. As you know, Sherry Ray is a 37 y.o. G1P0 at [redacted]w[redacted]d who presents for return consultation regarding antithrombin III deficiency.  Sherry Ray was previously seen by Dr. Janeece Agee for consultation at [facility] in [place]. Her history and recommendations are summarized in detail in her prior note.  Since that time, Sherry Ray has been monitored closely with anti-Xa levels which have all been in the therapeutic range. However, given her high risk condition and her values in the low therapeutic range, her lovenox dose was uptitrated to a current dose of 150mg  BID. Most recent level yesterday was appropriate 0.85. CBC at that time was also within normal limits. I recommend continuing the current dose of lovenox at this time. Levels may be repeated in 4 weeks.  Sherry Ray reports that in discussion with Dr. Julien Girt given her high lovenox dose they would feel most comfortable delivery with our group in [place]. We discussed the options of continued co-managed care v. Complete transfer of care. Ultimately, given the high risk nature of ATIII deficiency and the likely need for continued increases in lovenox dose going forward in the pregnancy, we will plan for complete transfer of care to [facility] for the remainder of pregnancy.  We will arrange future prenatal appointments with our practice in the next month. Sherry Ray should continue her scheduled visits with you until that time.   Thank you for the opportunity to be a part of the care of Sherry Ray. Please contact our office if we can be of further assistance.   I spent approximately 30 minutes with this patient with over 50% of time spent in face-to-face counseling.  Abram Sander, MD Maternal-Fetal Medicine

## 2017-08-25 ENCOUNTER — Other Ambulatory Visit (HOSPITAL_COMMUNITY)
Admission: RE | Admit: 2017-08-25 | Discharge: 2017-08-25 | Disposition: A | Discharge status: Home / Self Care | Payer: 59 | Source: Ambulatory Visit | Attending: Obstetrics and Gynecology | Admitting: Obstetrics and Gynecology

## 2017-08-25 DIAGNOSIS — D689 Coagulation defect, unspecified: Secondary | ICD-10-CM | POA: Diagnosis not present

## 2017-08-25 LAB — CBC
HCT: 35.2 % — ABNORMAL LOW (ref 36.0–46.0)
HEMOGLOBIN: 11.5 g/dL — AB (ref 12.0–15.0)
MCH: 28.3 pg (ref 26.0–34.0)
MCHC: 32.7 g/dL (ref 30.0–36.0)
MCV: 86.5 fL (ref 78.0–100.0)
PLATELETS: 267 10*3/uL (ref 150–400)
RBC: 4.07 MIL/uL (ref 3.87–5.11)
RDW: 13.9 % (ref 11.5–15.5)
WBC: 14.3 10*3/uL — AB (ref 4.0–10.5)

## 2017-08-25 LAB — HEPARIN LEVEL (UNFRACTIONATED): Heparin Unfractionated: 0.93 IU/mL — ABNORMAL HIGH (ref 0.30–0.70)

## 2017-09-01 ENCOUNTER — Other Ambulatory Visit (HOSPITAL_COMMUNITY)
Admission: RE | Admit: 2017-09-01 | Discharge: 2017-09-01 | Disposition: A | Discharge status: Home / Self Care | Payer: 59 | Source: Ambulatory Visit | Attending: Obstetrics and Gynecology | Admitting: Obstetrics and Gynecology

## 2017-09-01 DIAGNOSIS — D689 Coagulation defect, unspecified: Secondary | ICD-10-CM | POA: Insufficient documentation

## 2017-09-01 LAB — CBC
HCT: 36.6 % (ref 36.0–46.0)
Hemoglobin: 11.8 g/dL — ABNORMAL LOW (ref 12.0–15.0)
MCH: 27.9 pg (ref 26.0–34.0)
MCHC: 32.2 g/dL (ref 30.0–36.0)
MCV: 86.5 fL (ref 78.0–100.0)
PLATELETS: 263 10*3/uL (ref 150–400)
RBC: 4.23 MIL/uL (ref 3.87–5.11)
RDW: 14.1 % (ref 11.5–15.5)
WBC: 16.1 10*3/uL — AB (ref 4.0–10.5)

## 2017-09-01 LAB — HEPARIN LEVEL (UNFRACTIONATED): HEPARIN UNFRACTIONATED: 0.93 [IU]/mL — AB (ref 0.30–0.70)

## 2017-09-12 DIAGNOSIS — Z3402 Encounter for supervision of normal first pregnancy, second trimester: Secondary | ICD-10-CM | POA: Diagnosis not present

## 2017-09-12 DIAGNOSIS — Z3401 Encounter for supervision of normal first pregnancy, first trimester: Secondary | ICD-10-CM | POA: Diagnosis not present

## 2017-09-15 ENCOUNTER — Other Ambulatory Visit (HOSPITAL_COMMUNITY)
Admission: RE | Admit: 2017-09-15 | Discharge: 2017-09-15 | Disposition: A | Discharge status: Home / Self Care | Payer: 59 | Source: Ambulatory Visit | Attending: Obstetrics and Gynecology | Admitting: Obstetrics and Gynecology

## 2017-09-15 DIAGNOSIS — D689 Coagulation defect, unspecified: Secondary | ICD-10-CM | POA: Diagnosis not present

## 2017-09-15 LAB — CBC
HEMATOCRIT: 34.2 % — AB (ref 36.0–46.0)
HEMOGLOBIN: 11.2 g/dL — AB (ref 12.0–15.0)
MCH: 28.1 pg (ref 26.0–34.0)
MCHC: 32.7 g/dL (ref 30.0–36.0)
MCV: 85.7 fL (ref 78.0–100.0)
Platelets: 246 10*3/uL (ref 150–400)
RBC: 3.99 MIL/uL (ref 3.87–5.11)
RDW: 14.2 % (ref 11.5–15.5)
WBC: 17 10*3/uL — ABNORMAL HIGH (ref 4.0–10.5)

## 2017-09-15 LAB — HEPARIN LEVEL (UNFRACTIONATED): Heparin Unfractionated: 0.82 IU/mL — ABNORMAL HIGH (ref 0.30–0.70)

## 2017-09-22 ENCOUNTER — Other Ambulatory Visit (HOSPITAL_COMMUNITY): Payer: Self-pay | Admitting: Maternal & Fetal Medicine

## 2017-09-22 DIAGNOSIS — D6859 Other primary thrombophilia: Secondary | ICD-10-CM

## 2017-09-22 DIAGNOSIS — O9913 Other diseases of the blood and blood-forming organs and certain disorders involving the immune mechanism complicating the puerperium: Secondary | ICD-10-CM | POA: Diagnosis not present

## 2017-09-22 DIAGNOSIS — O9989 Other specified diseases and conditions complicating pregnancy, childbirth and the puerperium: Secondary | ICD-10-CM | POA: Diagnosis not present

## 2017-09-22 DIAGNOSIS — O99119 Other diseases of the blood and blood-forming organs and certain disorders involving the immune mechanism complicating pregnancy, unspecified trimester: Principal | ICD-10-CM

## 2017-09-22 DIAGNOSIS — O09512 Supervision of elderly primigravida, second trimester: Secondary | ICD-10-CM | POA: Diagnosis not present

## 2017-10-06 DIAGNOSIS — Z3A25 25 weeks gestation of pregnancy: Secondary | ICD-10-CM | POA: Diagnosis not present

## 2017-10-06 DIAGNOSIS — D6859 Other primary thrombophilia: Secondary | ICD-10-CM | POA: Diagnosis not present

## 2017-10-06 DIAGNOSIS — O09523 Supervision of elderly multigravida, third trimester: Secondary | ICD-10-CM | POA: Diagnosis not present

## 2017-10-20 DIAGNOSIS — O9913 Other diseases of the blood and blood-forming organs and certain disorders involving the immune mechanism complicating the puerperium: Secondary | ICD-10-CM | POA: Diagnosis not present

## 2017-10-20 DIAGNOSIS — O09523 Supervision of elderly multigravida, third trimester: Secondary | ICD-10-CM | POA: Diagnosis not present

## 2017-10-20 DIAGNOSIS — D6859 Other primary thrombophilia: Secondary | ICD-10-CM | POA: Diagnosis not present

## 2017-10-20 DIAGNOSIS — O99112 Other diseases of the blood and blood-forming organs and certain disorders involving the immune mechanism complicating pregnancy, second trimester: Secondary | ICD-10-CM | POA: Diagnosis not present

## 2017-10-20 DIAGNOSIS — O09512 Supervision of elderly primigravida, second trimester: Secondary | ICD-10-CM | POA: Diagnosis not present

## 2017-10-20 DIAGNOSIS — O09522 Supervision of elderly multigravida, second trimester: Secondary | ICD-10-CM | POA: Diagnosis not present

## 2017-10-31 ENCOUNTER — Ambulatory Visit (INDEPENDENT_AMBULATORY_CARE_PROVIDER_SITE_OTHER): Payer: 59 | Admitting: Urgent Care

## 2017-10-31 ENCOUNTER — Encounter: Payer: Self-pay | Admitting: Urgent Care

## 2017-10-31 VITALS — BP 130/68 | HR 113 | Temp 98.3°F | Resp 16 | Ht 64.0 in | Wt 171.4 lb

## 2017-10-31 DIAGNOSIS — Z9109 Other allergy status, other than to drugs and biological substances: Secondary | ICD-10-CM | POA: Diagnosis not present

## 2017-10-31 DIAGNOSIS — F329 Major depressive disorder, single episode, unspecified: Secondary | ICD-10-CM

## 2017-10-31 DIAGNOSIS — Z3493 Encounter for supervision of normal pregnancy, unspecified, third trimester: Secondary | ICD-10-CM

## 2017-10-31 DIAGNOSIS — H65192 Other acute nonsuppurative otitis media, left ear: Secondary | ICD-10-CM | POA: Diagnosis not present

## 2017-10-31 DIAGNOSIS — F419 Anxiety disorder, unspecified: Secondary | ICD-10-CM

## 2017-10-31 DIAGNOSIS — D6859 Other primary thrombophilia: Secondary | ICD-10-CM

## 2017-10-31 MED ORDER — AMOXICILLIN 875 MG PO TABS: 875.0000 mg | ORAL_TABLET | Freq: Two times a day (BID) | ORAL | 0 refills | Status: DC

## 2017-10-31 NOTE — Progress Notes (Signed)
    MRN: [id number] DOB: [birth year]  Subjective:   Sherry Ray is a 37 y.o. female presenting for 1 week history of persistent left ear pain with clear drainage intermittently.  She states that she feels like her ear stopped up.  Denies fever, dizziness, tinnitus, redness of her ear or swelling.  She has not been swimming recently.  Patient is in her third trimester of pregnancy, 29 weeks.  She is going to have a boy.  She has a history of allergies and is taking Zyrtec every day.  She has a history of Antithrombin deficiency and is on enoxaparin for this.  She also has a history of anxiety and depression and is taking fluoxetine for this.  Sherry Ray has a current medication list which includes the following prescription(s): aspirin, cetirizine, enoxaparin, fluoxetine, and prenatal vit-fe fumarate-fa. Also has No Known Allergies.  Sherry Ray  has a past medical history of Antithrombin deficiency ([place]), Anxiety and depression, Depression, DVT (deep venous thrombosis) ([place]), Headache, Pulmonary embolism ([place]), and Vaginal Pap smear, abnormal. Also  has a past surgical history that includes First rib removal; Esophagogastroduodenoscopy endoscopy; and Nasal sinus surgery.  Objective:   Vitals: Pulse (!) 113   Temp 98.3 F (36.8 C) (Oral)   Resp 16   Ht 5\' 4"  (1.626 m)   Wt 171 lb 6.4 oz (77.7 kg)   LMP 04/11/2017   SpO2 96%   BMI 29.42 kg/m   Physical Exam  Constitutional: She is oriented to person, place, and time. She appears well-developed and well-nourished.  HENT:  Both ears with effusions, left ear worse than right.  There is slight brownish drainage from her TM on left side.  No tragus tenderness.  Cardiovascular: Normal rate.  Pulmonary/Chest: Effort normal.  Neurological: She is alert and oriented to person, place, and time.   Assessment and Plan :   Acute effusion of left ear  Environmental allergies  Third trimester pregnancy  Antithrombin deficiency ([name])  Anxiety  and depression  Will cover for infectious process with amoxicillin which is safe during pregnancy.  Counseled patient that she should maintain her Zyrtec and she is okay to use Sudafed 30 to 60 mg 2-3 times a day as needed for fluid buildup.  Return to clinic precautions discussed.  Sherry Eagles, PA-C [facility] [phone] 10/31/2017  5:04 PM

## 2017-10-31 NOTE — Patient Instructions (Addendum)
You may try Sudafed (pseudoephedrine) at 30-60mg  once every 8 hours as needed for your fluid in your ears.   Otitis Media With Effusion, Pediatric Otitis media with effusion (OME) occurs when there is inflammation of the middle ear and fluid in the middle ear space. There are no signs and symptoms of infection. The middle ear space contains air and the bones for hearing. Air in the middle ear space helps to transmit sound to the brain. OME is a common condition in children, and it often occurs after an ear infection. This condition may be present for several weeks or longer after an ear infection. Most cases of this condition get better on their own. What are the causes? OME is caused by a blockage of the eustachian tube in one or both ears. These tubes drain fluid in the ears to the back of the nose (nasopharynx). If the tissue in the tube swells up (edema), the tube closes. This prevents fluid from draining. Blockage can be caused by:  Ear infections.  Colds and other upper respiratory infections.  Allergies.  Irritants, such as tobacco smoke.  Enlarged adenoids. The adenoids are areas of soft tissue located high in the back of the throat, behind the nose and the roof of the mouth. They are part of the body's natural defense (immune) system.  A mass in the nasopharynx.  Damage to the ear caused by pressure changes (barotrauma).  What increases the risk? Your child is more likely to develop this condition if:  He or she has repeated ear and sinus infections.  He or she has allergies.  He or she is exposed to tobacco smoke.  He or she attends daycare.  He or she is not breastfed.  What are the signs or symptoms? Symptoms of this condition may not be obvious. Sometimes this condition does not have any symptoms, or symptoms may overlap with those of a cold or upper respiratory tract illness. Symptoms of this condition include:  Temporary hearing loss.  A feeling of fullness in  the ear without pain.  Irritability or agitation.  Balance (vestibular) problems.  As a result of hearing loss, your child may:  Listen to the TV at a loud volume.  Not respond to questions.  Ask "What?" often when spoken to.  Mistake or confuse one sound or word for another.  Perform poorly at school.  Have a poor attention span.  Become agitated or irritated easily.  How is this diagnosed? This condition is diagnosed with an ear exam. Your child's health care provider will look inside your child's ear with an instrument (otoscope) to check for redness, swelling, and fluid. Other tests may be done, including:  A test to check the movement of the eardrum (pneumatic otoscopy). This is done by squeezing a small amount of air into the ear.  A test that changes air pressure in the middle ear to check how well the eardrum moves and to see if the eustachian tube is working (tympanogram).  Hearing test (audiogram). This test involves playing tones at different pitches to see if your child can hear each tone.  How is this treated? Treatment for this condition depends on the cause. In many cases, the fluid goes away on its own. In some cases, your child may need a procedure to create a hole in the eardrum to allow fluid to drain (myringotomy) and to insert small drainage tubes (tympanostomy tubes) into the eardrums. These tubes help to drain fluid and prevent infection.  This procedure may be recommended if:  OME does not get better over several months.  Your child has many ear infections within several months.  Your child has noticeable hearing loss.  Your child has problems with speech and language development.  Surgery may also be done to remove the adenoids (adenoidectomy). Follow these instructions at home:  Give over-the-counter and prescription medicines only as told by your child's health care provider.  Keep children away from any tobacco smoke.  Keep all follow-up  visits as told by your child's health care provider. This is important. How is this prevented?  Keep your child's vaccinations up to date. Make sure your child gets all recommended vaccinations, including a pneumonia and flu vaccine.  Encourage hand washing. Your child should wash his or her hands often with soap and water. If there is no soap and water, he or she should use hand sanitizer.  Avoid exposing your child to tobacco smoke.  Breastfeed your baby, if possible. Babies who are breastfed as long as possible are less likely to develop this condition. Contact a health care provider if:  Your child's hearing does not get better after 3 months.  Your child's hearing is worse.  Your child has ear pain.  Your child has a fever.  Your child has drainage from the ear.  Your child is dizzy.  Your child has a lump on his or her neck. Get help right away if:  Your child has bleeding from the nose.  Your child cannot move part of her or his face.  Your child has trouble breathing.  Your child cannot smell.  Your child develops severe congestion.  Your child develops weakness.  Your child who is younger than 3 months has a temperature of 100F (38C) or higher. Summary  Otitis media with effusion (OME) occurs when there is inflammation of the middle ear and fluid in the middle ear space.  This condition is caused by blockage of one or both eustachian tubes, which drain fluid in the ears to the back of the nose.  Symptoms of this condition can include temporary hearing loss, a feeling of fullness in the ear, irritability or agitation, and balance (vestibular) problems. Sometimes, there are no symptoms.  This condition is diagnosed with an ear exam and tests, such as pneumatic otoscopy, tympanogram, and audiogram.  Treatment for this condition depends on the cause. In many cases, the fluid goes away on its own. This information is not intended to replace advice given to you  by your health care provider. Make sure you discuss any questions you have with your health care provider. Document Released: 08/21/2003 Document Revised: 04/22/2016 Document Reviewed: 04/22/2016 Elsevier Interactive Patient Education  2017 [employer].     IF you received an x-ray today, you will receive an invoice from [facility]. Please contact [facility] at [phone] with questions or concerns regarding your invoice.   IF you received labwork today, you will receive an invoice from [facility]. Please contact [facility] at [phone] with questions or concerns regarding your invoice.   Our billing staff will not be able to assist you with questions regarding bills from these companies.  You will be contacted with the lab results as soon as they are available. The fastest way to get your results is to activate your My Chart account. Instructions are located on the last page of this paperwork. If you have not heard from [place] regarding the results in 2 weeks, please contact this office.

## 2017-11-17 DIAGNOSIS — O26893 Other specified pregnancy related conditions, third trimester: Secondary | ICD-10-CM | POA: Diagnosis not present

## 2017-11-17 DIAGNOSIS — O09513 Supervision of elderly primigravida, third trimester: Secondary | ICD-10-CM | POA: Diagnosis not present

## 2017-11-17 DIAGNOSIS — D6859 Other primary thrombophilia: Secondary | ICD-10-CM | POA: Diagnosis not present

## 2017-11-17 DIAGNOSIS — O99113 Other diseases of the blood and blood-forming organs and certain disorders involving the immune mechanism complicating pregnancy, third trimester: Secondary | ICD-10-CM | POA: Diagnosis not present

## 2017-12-07 ENCOUNTER — Ambulatory Visit (INDEPENDENT_AMBULATORY_CARE_PROVIDER_SITE_OTHER): Payer: Self-pay | Admitting: Pediatrics

## 2017-12-07 DIAGNOSIS — Z7681 Expectant parent(s) prebirth pediatrician visit: Secondary | ICD-10-CM

## 2017-12-11 NOTE — Progress Notes (Signed)
Prenatal counseling for impending newborn done--1st child, currently 34wks and plan to induce at 39wks, mom with history of blood clotting d/o (antithrombin deficiency) and followed by MFM, prenatal started 4wks Z76.81

## 2017-12-12 DIAGNOSIS — O26893 Other specified pregnancy related conditions, third trimester: Secondary | ICD-10-CM | POA: Diagnosis not present

## 2017-12-12 DIAGNOSIS — R030 Elevated blood-pressure reading, without diagnosis of hypertension: Secondary | ICD-10-CM | POA: Diagnosis not present

## 2017-12-12 DIAGNOSIS — O09513 Supervision of elderly primigravida, third trimester: Secondary | ICD-10-CM | POA: Diagnosis not present

## 2017-12-14 DIAGNOSIS — O9989 Other specified diseases and conditions complicating pregnancy, childbirth and the puerperium: Secondary | ICD-10-CM | POA: Diagnosis not present

## 2017-12-14 DIAGNOSIS — O09523 Supervision of elderly multigravida, third trimester: Secondary | ICD-10-CM | POA: Diagnosis not present

## 2017-12-14 DIAGNOSIS — O99113 Other diseases of the blood and blood-forming organs and certain disorders involving the immune mechanism complicating pregnancy, third trimester: Secondary | ICD-10-CM | POA: Diagnosis not present

## 2017-12-14 DIAGNOSIS — O9913 Other diseases of the blood and blood-forming organs and certain disorders involving the immune mechanism complicating the puerperium: Secondary | ICD-10-CM | POA: Diagnosis not present

## 2017-12-14 DIAGNOSIS — D6859 Other primary thrombophilia: Secondary | ICD-10-CM | POA: Diagnosis not present

## 2017-12-15 DIAGNOSIS — R945 Abnormal results of liver function studies: Secondary | ICD-10-CM | POA: Diagnosis not present

## 2017-12-15 DIAGNOSIS — O26893 Other specified pregnancy related conditions, third trimester: Secondary | ICD-10-CM | POA: Diagnosis not present

## 2017-12-15 DIAGNOSIS — O09513 Supervision of elderly primigravida, third trimester: Secondary | ICD-10-CM | POA: Diagnosis not present

## 2017-12-15 DIAGNOSIS — R748 Abnormal levels of other serum enzymes: Secondary | ICD-10-CM | POA: Diagnosis not present

## 2017-12-16 DIAGNOSIS — O09513 Supervision of elderly primigravida, third trimester: Secondary | ICD-10-CM | POA: Diagnosis not present

## 2017-12-16 DIAGNOSIS — Z3A35 35 weeks gestation of pregnancy: Secondary | ICD-10-CM | POA: Diagnosis not present

## 2017-12-19 DIAGNOSIS — O2662 Liver and biliary tract disorders in childbirth: Secondary | ICD-10-CM | POA: Diagnosis not present

## 2017-12-19 DIAGNOSIS — O1213 Gestational proteinuria, third trimester: Secondary | ICD-10-CM | POA: Diagnosis not present

## 2017-12-19 DIAGNOSIS — Z7901 Long term (current) use of anticoagulants: Secondary | ICD-10-CM | POA: Diagnosis not present

## 2017-12-19 DIAGNOSIS — O09513 Supervision of elderly primigravida, third trimester: Secondary | ICD-10-CM | POA: Diagnosis not present

## 2017-12-19 DIAGNOSIS — O1403 Mild to moderate pre-eclampsia, third trimester: Secondary | ICD-10-CM | POA: Diagnosis not present

## 2017-12-19 DIAGNOSIS — O99113 Other diseases of the blood and blood-forming organs and certain disorders involving the immune mechanism complicating pregnancy, third trimester: Secondary | ICD-10-CM | POA: Diagnosis not present

## 2017-12-19 DIAGNOSIS — K831 Obstruction of bile duct: Secondary | ICD-10-CM | POA: Diagnosis not present

## 2017-12-19 DIAGNOSIS — O9912 Other diseases of the blood and blood-forming organs and certain disorders involving the immune mechanism complicating childbirth: Secondary | ICD-10-CM | POA: Diagnosis not present

## 2017-12-19 DIAGNOSIS — O99713 Diseases of the skin and subcutaneous tissue complicating pregnancy, third trimester: Secondary | ICD-10-CM | POA: Diagnosis not present

## 2017-12-19 DIAGNOSIS — Z5181 Encounter for therapeutic drug level monitoring: Secondary | ICD-10-CM | POA: Diagnosis not present

## 2017-12-19 DIAGNOSIS — L299 Pruritus, unspecified: Secondary | ICD-10-CM | POA: Diagnosis not present

## 2017-12-19 DIAGNOSIS — Z3A36 36 weeks gestation of pregnancy: Secondary | ICD-10-CM | POA: Diagnosis not present

## 2017-12-19 DIAGNOSIS — D6859 Other primary thrombophilia: Secondary | ICD-10-CM | POA: Diagnosis not present

## 2017-12-19 DIAGNOSIS — Z86711 Personal history of pulmonary embolism: Secondary | ICD-10-CM | POA: Diagnosis not present

## 2017-12-19 DIAGNOSIS — Z86718 Personal history of other venous thrombosis and embolism: Secondary | ICD-10-CM | POA: Diagnosis not present

## 2017-12-22 ENCOUNTER — Ambulatory Visit: Payer: 59 | Admitting: Urgent Care

## 2018-01-02 ENCOUNTER — Ambulatory Visit (INDEPENDENT_AMBULATORY_CARE_PROVIDER_SITE_OTHER): Payer: 59 | Admitting: Urgent Care

## 2018-01-02 ENCOUNTER — Encounter: Payer: Self-pay | Admitting: Urgent Care

## 2018-01-02 VITALS — BP 124/72 | HR 115 | Temp 98.6°F | Resp 16 | Ht 64.0 in

## 2018-01-02 DIAGNOSIS — H9203 Otalgia, bilateral: Secondary | ICD-10-CM | POA: Diagnosis not present

## 2018-01-02 DIAGNOSIS — J3089 Other allergic rhinitis: Secondary | ICD-10-CM

## 2018-01-02 DIAGNOSIS — G8929 Other chronic pain: Secondary | ICD-10-CM

## 2018-01-02 DIAGNOSIS — H6983 Other specified disorders of Eustachian tube, bilateral: Secondary | ICD-10-CM

## 2018-01-02 DIAGNOSIS — Z9109 Other allergy status, other than to drugs and biological substances: Secondary | ICD-10-CM | POA: Diagnosis not present

## 2018-01-02 DIAGNOSIS — H65493 Other chronic nonsuppurative otitis media, bilateral: Secondary | ICD-10-CM

## 2018-01-02 MED ORDER — PRENATAL 19 PO TABS
1.00 | ORAL_TABLET | ORAL | Status: DC
Start: 2018-01-01 — End: 2018-01-02

## 2018-01-02 MED ORDER — OXYCODONE HCL 10 MG PO TABS
10.00 | ORAL_TABLET | ORAL | Status: DC
Start: ? — End: 2018-01-02

## 2018-01-02 MED ORDER — DIPHENHYDRAMINE HCL 25 MG PO CAPS
25.00 | ORAL_CAPSULE | ORAL | Status: DC
Start: ? — End: 2018-01-02

## 2018-01-02 MED ORDER — LANOLIN EX OINT
TOPICAL_OINTMENT | CUTANEOUS | Status: DC
Start: ? — End: 2018-01-02

## 2018-01-02 MED ORDER — ALUM & MAG HYDROXIDE-SIMETH 200-200-20 MG/5ML PO SUSP
30.00 | ORAL | Status: DC
Start: ? — End: 2018-01-02

## 2018-01-02 MED ORDER — BISACODYL 10 MG RE SUPP
10.00 | RECTAL | Status: DC
Start: ? — End: 2018-01-02

## 2018-01-02 MED ORDER — ENOXAPARIN SODIUM 150 MG/ML ~~LOC~~ SOLN
150.00 | SUBCUTANEOUS | Status: DC
Start: 2018-01-01 — End: 2018-01-02

## 2018-01-02 MED ORDER — HYDROCODONE-ACETAMINOPHEN 10-325 MG PO TABS
1.00 | ORAL_TABLET | ORAL | Status: DC
Start: ? — End: 2018-01-02

## 2018-01-02 MED ORDER — HYDROCODONE-ACETAMINOPHEN 5-325 MG PO TABS
1.00 | ORAL_TABLET | ORAL | Status: DC
Start: ? — End: 2018-01-02

## 2018-01-02 MED ORDER — GUAIFENESIN 100 MG/5ML PO SYRP
200.00 | ORAL_SOLUTION | ORAL | Status: DC
Start: ? — End: 2018-01-02

## 2018-01-02 MED ORDER — IBUPROFEN 800 MG PO TABS
800.00 | ORAL_TABLET | ORAL | Status: DC
Start: 2018-01-01 — End: 2018-01-02

## 2018-01-02 MED ORDER — MAGNESIUM HYDROXIDE 400 MG/5ML PO SUSP
30.00 | ORAL | Status: DC
Start: ? — End: 2018-01-02

## 2018-01-02 MED ORDER — SALINE NASAL SPRAY 0.65 % NA SOLN
2.00 | NASAL | Status: DC
Start: ? — End: 2018-01-02

## 2018-01-02 MED ORDER — MORPHINE SULFATE (PF) 4 MG/ML IV SOLN
4.00 | INTRAVENOUS | Status: DC
Start: ? — End: 2018-01-02

## 2018-01-02 MED ORDER — TETANUS-DIPHTH-ACELL PERTUSSIS 5-2-15.5 LF-MCG/0.5 IM SUSP
0.50 | INTRAMUSCULAR | Status: DC
Start: ? — End: 2018-01-02

## 2018-01-02 MED ORDER — GENERIC EXTERNAL MEDICATION
Status: DC
Start: ? — End: 2018-01-02

## 2018-01-02 MED ORDER — PSEUDOEPHEDRINE HCL ER 120 MG PO TB12: 120.0000 mg | ORAL_TABLET | Freq: Two times a day (BID) | ORAL | 3 refills | Status: DC

## 2018-01-02 MED ORDER — POLYETHYLENE GLYCOL 3350 17 G PO PACK
17.00 | PACK | ORAL | Status: DC
Start: ? — End: 2018-01-02

## 2018-01-02 MED ORDER — SIMETHICONE 80 MG PO CHEW
80.00 | CHEWABLE_TABLET | ORAL | Status: DC
Start: ? — End: 2018-01-02

## 2018-01-02 MED ORDER — BENZOCAINE-MENTHOL 20-0.5 % EX AERO
INHALATION_SPRAY | CUTANEOUS | Status: DC
Start: ? — End: 2018-01-02

## 2018-01-02 MED ORDER — DOCUSATE SODIUM 100 MG PO CAPS
100.00 | ORAL_CAPSULE | ORAL | Status: DC
Start: 2018-01-01 — End: 2018-01-02

## 2018-01-02 MED ORDER — BENZOCAINE-MENTHOL 15-3.6 MG MT LOZG
1.00 | LOZENGE | OROMUCOSAL | Status: DC
Start: ? — End: 2018-01-02

## 2018-01-02 MED ORDER — MORPHINE SULFATE (PF) 10 MG/ML IV SOLN
10.00 | INTRAVENOUS | Status: DC
Start: ? — End: 2018-01-02

## 2018-01-02 MED ORDER — MEASLES, MUMPS & RUBELLA VAC ~~LOC~~ INJ
.50 | INJECTION | SUBCUTANEOUS | Status: DC
Start: ? — End: 2018-01-02

## 2018-01-02 MED ORDER — ACETAMINOPHEN 325 MG PO TABS
650.00 | ORAL_TABLET | ORAL | Status: DC
Start: ? — End: 2018-01-02

## 2018-01-02 MED ORDER — MONTELUKAST SODIUM 10 MG PO TABS: 10.0000 mg | ORAL_TABLET | Freq: Every day | ORAL | 1 refills | Status: DC

## 2018-01-02 NOTE — Patient Instructions (Addendum)
Eustachian Tube Dysfunction The eustachian tube connects the middle ear to the back of the nose. It regulates air pressure in the middle ear by allowing air to move between the ear and nose. It also helps to drain fluid from the middle ear space. When the eustachian tube does not function properly, air pressure, fluid, or both can build up in the middle ear. Eustachian tube dysfunction can affect one or both ears. What are the causes? This condition happens when the eustachian tube becomes blocked or cannot open normally. This may result from:  Ear infections.  Colds and other upper respiratory infections.  Allergies.  Irritation, such as from cigarette smoke or acid from the stomach coming up into the esophagus (gastroesophageal reflux).  Sudden changes in air pressure, such as from descending in an airplane.  Abnormal growths in the nose or throat, such as nasal polyps, tumors, or enlarged tissue at the back of the throat (adenoids).  What increases the risk? This condition may be more likely to develop in people who smoke and people who are overweight. Eustachian tube dysfunction may also be more likely to develop in children, especially children who have:  Certain birth defects of the mouth, such as cleft palate.  Large tonsils and adenoids.  What are the signs or symptoms? Symptoms of this condition may include:  A feeling of fullness in the ear.  Ear pain.  Clicking or popping noises in the ear.  Ringing in the ear.  Hearing loss.  Loss of balance.  Symptoms may get worse when the air pressure around you changes, such as when you travel to an area of high elevation or fly on an airplane. How is this diagnosed? This condition may be diagnosed based on:  Your symptoms.  A physical exam of your ear, nose, and throat.  Tests, such as those that measure: ? The movement of your eardrum (tympanogram). ? Your hearing (audiometry).  How is this treated? Treatment  depends on the cause and severity of your condition. If your symptoms are mild, you may be able to relieve your symptoms by moving air into ("popping") your ears. If you have symptoms of fluid in your ears, treatment may include:  Decongestants.  Antihistamines.  Nasal sprays or ear drops that contain medicines that reduce swelling (steroids).  In some cases, you may need to have a procedure to drain the fluid in your eardrum (myringotomy). In this procedure, a small tube is placed in the eardrum to:  Drain the fluid.  Restore the air in the middle ear space.  Follow these instructions at home:  Take over-the-counter and prescription medicines only as told by your health care provider.  Use techniques to help pop your ears as recommended by your health care provider. These may include: ? Chewing gum. ? Yawning. ? Frequent, forceful swallowing. ? Closing your mouth, holding your nose closed, and gently blowing as if you are trying to blow air out of your nose.  Do not do any of the following until your health care provider approves: ? Travel to high altitudes. ? Fly in airplanes. ? Work in a pressurized cabin or room. ? Scuba dive.  Keep your ears dry. Dry your ears completely after showering or bathing.  Do not smoke.  Keep all follow-up visits as told by your health care provider. This is important. Contact a health care provider if:  Your symptoms do not go away after treatment.  Your symptoms come back after treatment.  You are   unable to pop your ears.  You have: ? A fever. ? Pain in your ear. ? Pain in your head or neck. ? Fluid draining from your ear.  Your hearing suddenly changes.  You become very dizzy.  You lose your balance. This information is not intended to replace advice given to you by your health care provider. Make sure you discuss any questions you have with your health care provider. Document Released: 06/27/2015 Document Revised: 11/06/2015  Document Reviewed: 06/19/2014 Elsevier Interactive Patient Education  2018 [employer].     IF you received an x-ray today, you will receive an invoice from [facility]. Please contact [facility] at [phone] with questions or concerns regarding your invoice.   IF you received labwork today, you will receive an invoice from [facility]. Please contact [facility] at [phone] with questions or concerns regarding your invoice.   Our billing staff will not be able to assist you with questions regarding bills from these companies.  You will be contacted with the lab results as soon as they are available. The fastest way to get your results is to activate your My Chart account. Instructions are located on the last page of this paperwork. If you have not heard from [place] regarding the results in 2 weeks, please contact this office.

## 2018-01-02 NOTE — Progress Notes (Signed)
    MRN: [id number] DOB: 1980/08/04  Subjective:   Sherry Ray is a 37 y.o. female presenting for follow up on persistent bilateral ear pain, left worse than right.  Last office visit was 10/31/2017, started on amoxicillin.  Patient reports no improvement from this antibiotic course.  She has maintained her Flonase and Zyrtec.  She was also seen again in June at a different practice, started on Augmentin.  She had some improvement but reports today that her symptoms are now the same as before.  She has persistent ear popping, ear fullness, ear pain.  Has had persistent nasal congestion, sinus issues throughout her pregnancy.  Denies fever, sinus pain, ear drainage.  She is breast-feeding, still taking enoxaparin.  She did have her baby 3 days ago, is doing well.  Ashritha has a current medication list which includes the following prescription(s): acetaminophen, cetirizine, docusate sodium, enoxaparin, ferrous sulfate, fluoxetine, ibuprofen, oxycodone, and prenatal vit-fe fumarate-fa. Also has No Known Allergies.  [name]  has a past medical history of Antithrombin deficiency ([place]), Anxiety and depression, Depression, DVT (deep venous thrombosis) ([place]), Headache, Pulmonary embolism ([place]), and Vaginal Pap smear, abnormal. Also  has a past surgical history that includes First rib removal; Esophagogastroduodenoscopy endoscopy; and Nasal sinus surgery.  Objective:   Vitals: BP 124/72   Pulse (!) 115   Temp 98.6 F (37 C) (Oral)   Resp 16   Ht 5\' 4"  (1.626 m)   LMP 04/11/2017   SpO2 97%   BMI 29.42 kg/m   Physical Exam  Constitutional: She is oriented to person, place, and time. She appears well-developed and well-nourished.  HENT:  Bilateral ear effusions without erythema or bulging TM.  TM is also intact bilaterally.  Nasal mucosa is edematous with rhinorrhea, nasal passages minimally to moderately patent.  Eyes: Right eye exhibits no discharge. Left eye exhibits no discharge. No scleral  icterus.  Cardiovascular:  Tachycardic.  Pulmonary/Chest: Effort normal.  Neurological: She is alert and oriented to person, place, and time.  Skin: Skin is warm and dry.  Psychiatric: She has a normal mood and affect.   Assessment and Plan :   Environmental allergies - Plan: Ambulatory referral to ENT  Chronic ear pain, bilateral - Plan: Ambulatory referral to ENT  Dysfunction of both eustachian tubes - Plan: Ambulatory referral to ENT  Non-seasonal allergic rhinitis due to other allergic trigger  Chronic otitis media of both ears with effusion - Plan: Ambulatory referral to ENT  Maintain Flonase, Zyrtec.  Start Singulair and pseudoephedrine.  Patient is not a good candidate for a steroid course to address allergic rhinitis, ETD.  Will place referral to ENT at patient's request.  She may be better off going to an allergist but she prefers an ENT first. Counseled patient on potential for adverse effects with medications prescribed today, patient verbalized understanding.  Continue follow-up with obstetrician regarding her enoxaparin.  Jaynee Eagles, PA-C [facility] [phone] 01/02/2018 5:07 PM

## 2018-01-05 ENCOUNTER — Telehealth: Payer: Self-pay | Admitting: Hematology and Oncology

## 2018-01-05 NOTE — Telephone Encounter (Signed)
Tried to reach regarding voicemail °

## 2018-01-09 ENCOUNTER — Inpatient Hospital Stay: Payer: 59 | Admitting: Hematology and Oncology

## 2018-01-09 DIAGNOSIS — H9313 Tinnitus, bilateral: Secondary | ICD-10-CM | POA: Diagnosis not present

## 2018-01-09 DIAGNOSIS — J322 Chronic ethmoidal sinusitis: Secondary | ICD-10-CM | POA: Diagnosis not present

## 2018-01-09 DIAGNOSIS — H6523 Chronic serous otitis media, bilateral: Secondary | ICD-10-CM | POA: Diagnosis not present

## 2018-01-09 DIAGNOSIS — J320 Chronic maxillary sinusitis: Secondary | ICD-10-CM | POA: Diagnosis not present

## 2018-01-10 ENCOUNTER — Inpatient Hospital Stay: Payer: 59 | Attending: Hematology and Oncology | Admitting: Hematology and Oncology

## 2018-01-10 DIAGNOSIS — Z7901 Long term (current) use of anticoagulants: Secondary | ICD-10-CM | POA: Diagnosis not present

## 2018-01-10 DIAGNOSIS — D6859 Other primary thrombophilia: Secondary | ICD-10-CM | POA: Insufficient documentation

## 2018-01-10 NOTE — Progress Notes (Signed)
Patient Care Team: Jaynee Eagles, Hershal Coria as PCP - General (Urgent Care)  DIAGNOSIS:  Encounter Diagnosis  Name Primary?  Marland Kitchen Antithrombin deficiency ([name])     CHIEF COMPLIANT: Patient delivered 2 weeks ago by C-section  INTERVAL HISTORY: Sherry Ray is a 38 year old with above-mentioned history of Antithrombin deficiency who successfully delivered female baby boy 2 weeks ago by C-section.  She was delivered at [facility].  She was given Antithrombin III by infusion postpartum 3 doses.  She is still on Lovenox injections at 160 mg p.o. twice daily and she will be on it for the next 4 weeks.  She is recovering from the cesarean section.  REVIEW OF SYSTEMS:   Constitutional: Denies fevers, chills or abnormal weight loss Eyes: Denies blurriness of vision Ears, nose, mouth, throat, and face: Denies mucositis or sore throat Respiratory: Denies cough, dyspnea or wheezes Cardiovascular: Denies palpitation, chest discomfort Gastrointestinal:  Denies nausea, heartburn or change in bowel habits Skin: Denies abnormal skin rashes Lymphatics: Denies new lymphadenopathy or easy bruising Neurological:Denies numbness, tingling or new weaknesses Behavioral/Psych: Mood is stable, no new changes  Extremities: No lower extremity edema   All other systems were reviewed with the patient and are negative.  I have reviewed the past medical history, past surgical history, social history and family history with the patient and they are unchanged from previous note.  ALLERGIES:  has No Known Allergies.  MEDICATIONS:  Current Outpatient Medications  Medication Sig Dispense Refill  . acetaminophen (TYLENOL) 500 MG tablet Take by mouth.    . cetirizine (ZYRTEC) 10 MG tablet Take 1 tablet by mouth daily.    Marland Kitchen docusate sodium (COLACE) 100 MG capsule Take by mouth.    . enoxaparin (LOVENOX) 80 MG/0.8ML injection Inject 140 mg into the skin 2 (two) times daily.     . ferrous sulfate 325 (65 FE) MG tablet  Take by mouth.    Marland Kitchen FLUoxetine (PROZAC) 20 MG capsule Take 1 capsule (20 mg total) by mouth daily. 90 capsule 1  . ibuprofen (ADVIL,MOTRIN) 800 MG tablet Take by mouth.    . montelukast (SINGULAIR) 10 MG tablet Take 1 tablet (10 mg total) by mouth at bedtime. 90 tablet 1  . Prenatal Vit-Fe Fumarate-FA (PRENATAL VITAMIN PO) Take 1 tablet by mouth daily.    . pseudoephedrine (SUDAFED 12 HOUR) 120 MG 12 hr tablet Take 1 tablet (120 mg total) by mouth 2 (two) times daily. 30 tablet 3   No current facility-administered medications for this visit.     PHYSICAL EXAMINATION: ECOG PERFORMANCE STATUS: 2 - Symptomatic, <50% confined to bed  Vitals:   01/10/18 1516  BP: 128/62  Pulse: 99  Resp: 18  Temp: 98.4 F (36.9 C)  SpO2: 100%   Filed Weights   01/10/18 1516  Weight: 153 lb 8 oz (69.6 kg)    GENERAL:alert, no distress and comfortable SKIN: skin color, texture, turgor are normal, no rashes or significant lesions EYES: normal, Conjunctiva are pink and non-injected, sclera clear OROPHARYNX:no exudate, no erythema and lips, buccal mucosa, and tongue normal  NECK: supple, thyroid normal size, non-tender, without nodularity LYMPH:  no palpable lymphadenopathy in the cervical, axillary or inguinal LUNGS: clear to auscultation and percussion with normal breathing effort HEART: regular rate & rhythm and no murmurs and no lower extremity edema ABDOMEN:abdomen soft, non-tender and normal bowel sounds MUSCULOSKELETAL:no cyanosis of digits and no clubbing  NEURO: alert & oriented x 3 with fluent speech, no focal motor/sensory deficits EXTREMITIES: No  lower extremity edema   LABORATORY DATA:  I have reviewed the data as listed CMP Latest Ref Rng & Units 04/28/2017 07/14/2016 02/22/2007  Glucose 65 - 99 mg/dL 65 91 92  BUN 6 - 20 mg/dL 12 12 10   Creatinine 0.57 - 1.00 mg/dL 0.74 0.72 0.8  Sodium 134 - 144 mmol/L 141 139 135  Potassium 3.5 - 5.2 mmol/L 4.2 4.3 3.9  Chloride 96 - 106 mmol/L  101 101 102  CO2 20 - 29 mmol/L 23 20 -  Calcium 8.7 - 10.2 mg/dL 9.0 9.7 -  Total Protein 6.0 - 8.5 g/dL 7.2 - -  Total Bilirubin 0.0 - 1.2 mg/dL <0.2 - -  Alkaline Phos 39 - 117 IU/L 82 - -  AST 0 - 40 IU/L 22 - -  ALT 0 - 32 IU/L 26 - -    Lab Results  Component Value Date   WBC 17.0 (H) 09/15/2017   HGB 11.2 (L) 09/15/2017   HCT 34.2 (L) 09/15/2017   MCV 85.7 09/15/2017   PLT 246 09/15/2017   NEUTROABS 5.9 07/14/2016    ASSESSMENT & PLAN:  Antithrombin deficiency ([place]) 02/13/2007 bilateral pulmonary emboli bilateral lower lobes and right middle lobe, right lower lobe pulmonary infarct 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 Patient delivered 2 weeks ago baby boy at [facility] by C-section. She received Antithrombin infusions 3 doses postpartum.  Current treatment: Lovenox Patient had questions about her baby boy.  I discussed with her that there is no contraindication for circumcision.  I did not recommend testing Baby for Antithrombin levels.  He could be tested after age 54. She will inform her pediatrician.  Return to clinic on an as-needed basis. No orders of the defined types were placed in this encounter.  The patient has a good understanding of the overall plan. she agrees with it. she will call with any problems that may develop before the next visit here.   Harriette Ohara, MD 01/10/18

## 2018-01-10 NOTE — Assessment & Plan Note (Signed)
02/13/2007 bilateral pulmonary emboli bilateral lower lobes and right middle lobe, right lower lobe pulmonary infarct 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  Current treatment: Lovenox Treatment plan change: We will switch her to heparin 70,500 units subcu twice daily.  This will be held at least 6 to 12 hours prior to her epidural injections or delivery.  Lovenox can be resumed 12 hours postpartum if no bleeding problems are anticipated.

## 2018-01-25 DIAGNOSIS — H6523 Chronic serous otitis media, bilateral: Secondary | ICD-10-CM | POA: Diagnosis not present

## 2018-01-25 DIAGNOSIS — J301 Allergic rhinitis due to pollen: Secondary | ICD-10-CM | POA: Diagnosis not present

## 2018-01-25 DIAGNOSIS — J3081 Allergic rhinitis due to animal (cat) (dog) hair and dander: Secondary | ICD-10-CM | POA: Diagnosis not present

## 2018-02-04 ENCOUNTER — Encounter (HOSPITAL_COMMUNITY): Payer: Self-pay

## 2018-02-04 ENCOUNTER — Emergency Department (HOSPITAL_COMMUNITY): Payer: 59

## 2018-02-04 ENCOUNTER — Other Ambulatory Visit: Payer: Self-pay

## 2018-02-04 ENCOUNTER — Emergency Department (HOSPITAL_COMMUNITY)
Admission: EM | Admit: 2018-02-04 | Discharge: 2018-02-05 | Disposition: A | Discharge status: Home / Self Care | Payer: 59 | Attending: Emergency Medicine | Admitting: Emergency Medicine

## 2018-02-04 DIAGNOSIS — R112 Nausea with vomiting, unspecified: Secondary | ICD-10-CM | POA: Insufficient documentation

## 2018-02-04 DIAGNOSIS — O9989 Other specified diseases and conditions complicating pregnancy, childbirth and the puerperium: Secondary | ICD-10-CM | POA: Diagnosis not present

## 2018-02-04 DIAGNOSIS — R1011 Right upper quadrant pain: Secondary | ICD-10-CM

## 2018-02-04 DIAGNOSIS — Z79899 Other long term (current) drug therapy: Secondary | ICD-10-CM | POA: Insufficient documentation

## 2018-02-04 DIAGNOSIS — R740 Nonspecific elevation of levels of transaminase and lactic acid dehydrogenase [LDH]: Secondary | ICD-10-CM | POA: Insufficient documentation

## 2018-02-04 DIAGNOSIS — R52 Pain, unspecified: Secondary | ICD-10-CM

## 2018-02-04 DIAGNOSIS — Z86711 Personal history of pulmonary embolism: Secondary | ICD-10-CM | POA: Diagnosis not present

## 2018-02-04 DIAGNOSIS — R140 Abdominal distension (gaseous): Secondary | ICD-10-CM | POA: Diagnosis not present

## 2018-02-04 DIAGNOSIS — R7401 Elevation of levels of liver transaminase levels: Secondary | ICD-10-CM

## 2018-02-04 LAB — CBC
HCT: 38.7 % (ref 36.0–46.0)
Hemoglobin: 12.2 g/dL (ref 12.0–15.0)
MCH: 26.5 pg (ref 26.0–34.0)
MCHC: 31.5 g/dL (ref 30.0–36.0)
MCV: 83.9 fL (ref 78.0–100.0)
PLATELETS: 412 10*3/uL — AB (ref 150–400)
RBC: 4.61 MIL/uL (ref 3.87–5.11)
RDW: 15.8 % — AB (ref 11.5–15.5)
WBC: 10.6 10*3/uL — AB (ref 4.0–10.5)

## 2018-02-04 LAB — URINALYSIS, ROUTINE W REFLEX MICROSCOPIC
Bilirubin Urine: NEGATIVE
GLUCOSE, UA: NEGATIVE mg/dL
HGB URINE DIPSTICK: NEGATIVE
Ketones, ur: NEGATIVE mg/dL
Nitrite: NEGATIVE
PH: 8 (ref 5.0–8.0)
PROTEIN: NEGATIVE mg/dL
SPECIFIC GRAVITY, URINE: 1.024 (ref 1.005–1.030)

## 2018-02-04 LAB — COMPREHENSIVE METABOLIC PANEL
ALK PHOS: 135 U/L — AB (ref 38–126)
ALT: 45 U/L — AB (ref 0–44)
AST: 42 U/L — AB (ref 15–41)
Albumin: 3.7 g/dL (ref 3.5–5.0)
Anion gap: 10 (ref 5–15)
BUN: 17 mg/dL (ref 6–20)
CALCIUM: 9.5 mg/dL (ref 8.9–10.3)
CO2: 26 mmol/L (ref 22–32)
CREATININE: 0.8 mg/dL (ref 0.44–1.00)
Chloride: 105 mmol/L (ref 98–111)
GFR calc Af Amer: >60 mL/min (ref >60)
Glucose, Bld: 100 mg/dL — ABNORMAL HIGH (ref 70–99)
Potassium: 3.9 mmol/L (ref 3.5–5.1)
Sodium: 141 mmol/L (ref 135–145)
Total Bilirubin: 0.4 mg/dL (ref 0.3–1.2)
Total Protein: 7.2 g/dL (ref 6.5–8.1)

## 2018-02-04 LAB — I-STAT BETA HCG BLOOD, ED (MC, WL, AP ONLY): I-stat hCG, quantitative: <5 m[IU]/mL (ref <5)

## 2018-02-04 LAB — LIPASE, BLOOD: Lipase: 41 U/L (ref 11–51)

## 2018-02-04 MED ORDER — HYDROMORPHONE HCL 1 MG/ML IJ SOLN
1.0000 mg | Freq: Once | INTRAMUSCULAR | Status: AC
  Administered 2018-02-05: 1 mg via INTRAVENOUS
  Filled 2018-02-04: qty 1

## 2018-02-04 MED ORDER — ONDANSETRON HCL 4 MG/2ML IJ SOLN
4.0000 mg | Freq: Once | INTRAMUSCULAR | Status: AC
  Administered 2018-02-05: 4 mg via INTRAVENOUS
  Filled 2018-02-04: qty 2

## 2018-02-04 MED ORDER — SODIUM CHLORIDE 0.9 % IV BOLUS
1000.0000 mL | Freq: Once | INTRAVENOUS | Status: AC
  Administered 2018-02-05: 1000 mL via INTRAVENOUS

## 2018-02-04 NOTE — ED Triage Notes (Signed)
Pt reports RUQ pain that radiated to her back. She reports 3-4 "attacks" over the last month. She delivered a baby 5 weeks ago and reports gallbladder issues during pregnancy. She endorses vomiting and states that that usually helps the pain, but did not today. A&Ox4. Ambulatory.

## 2018-02-05 DIAGNOSIS — R140 Abdominal distension (gaseous): Secondary | ICD-10-CM | POA: Diagnosis not present

## 2018-02-05 MED ORDER — METOCLOPRAMIDE HCL 5 MG/ML IJ SOLN
10.0000 mg | Freq: Once | INTRAMUSCULAR | Status: AC
  Administered 2018-02-05: 10 mg via INTRAVENOUS
  Filled 2018-02-05: qty 2

## 2018-02-05 MED ORDER — ONDANSETRON 4 MG PO TBDP: 4.0000 mg | ORAL_TABLET | Freq: Three times a day (TID) | ORAL | 0 refills | Status: DC | PRN

## 2018-02-05 MED ORDER — HYDROCODONE-ACETAMINOPHEN 5-325 MG PO TABS: 1.0000 | ORAL_TABLET | Freq: Four times a day (QID) | ORAL | 0 refills | Status: DC | PRN

## 2018-02-05 NOTE — Discharge Instructions (Addendum)
We recommend that you closely follow-up with gastroenterology.  You would benefit from a HIDA scan for further evaluation of her symptoms.  Avoid fried foods or fatty foods as this may cause recurrence of your pain.  You have been prescribed Zofran to take for nausea and Norco for pain as needed.  Be aware that Norco may cause sedation of your infant in the setting of breast-feeding.  Try to take this medication sparingly for this reason.  You may consult with your OB/GYN should you have concerns regarding use of this medication.  Return to the emergency department for new or concerning symptoms such as worsening pain, fever over 101F, uncontrolled nausea or vomiting.

## 2018-02-05 NOTE — ED Notes (Signed)
Pt has had a cup of juice and done well

## 2018-02-05 NOTE — ED Provider Notes (Signed)
[facility] DEPT Provider Note   CSN: [id number] Arrival date & time: 02/04/18  2154     History   Chief Complaint Chief Complaint  Patient presents with  . Abdominal Pain    HPI Sherry Ray is a 37 y.o. female.   The patient is a 37 y/o female with hx of antithrombin deficiency (hx of PE and DVT, not currently on anticoagulation), anxiety and depression who presents to the ED for c/o RUQ pain.  Reports 3-4 episodes of similar pain over the past 5-6 weeks.  She is 5 weeks postpartum and noted onset of symptoms during pregnancy.  Felt that her pain today was more severe than previous.  It is constant and nonradiating.  No medications taken PTA for symptoms.  She did have associated nausea and vomiting today.  No fever, bowel changes, urinary symptoms.  Abdominal SHx significant for C-section.   The patient is currently breastfeeding.     Past Medical History:  Diagnosis Date  . Antithrombin deficiency ([place])    had a PE (2008) and DVT (1996), took coumadin for 1 year  . Anxiety and depression   . Depression   . DVT (deep venous thrombosis) ([place])   . Headache   . Pulmonary embolism ([place])   . Vaginal Pap smear, abnormal     Patient Active Problem List   Diagnosis Date Noted  . Antithrombin deficiency ([place]) 07/14/2016  . Depression with anxiety 12/17/2015    Past Surgical History:  Procedure Laterality Date  . ESOPHAGOGASTRODUODENOSCOPY ENDOSCOPY    . FIRST RIB REMOVAL    . NASAL SINUS SURGERY       OB History    Gravida  1   Para      Term      Preterm      AB      Living  0     SAB      TAB      Ectopic      Multiple      Live Births               Home Medications    Prior to Admission medications   Medication Sig Start Date End Date Taking? Authorizing Provider  acetaminophen (TYLENOL) 500 MG tablet Take by mouth. 01/01/18   [provider]  cetirizine (ZYRTEC) 10 MG tablet Take 1 tablet  by mouth daily.    [provider]  docusate sodium (COLACE) 100 MG capsule Take by mouth. 01/01/18 01/01/19  [provider]  enoxaparin (LOVENOX) 80 MG/0.8ML injection Inject 140 mg into the skin 2 (two) times daily.  06/16/17   [provider]  ferrous sulfate 325 (65 FE) MG tablet Take by mouth. 01/01/18   [provider]  FLUoxetine (PROZAC) 20 MG capsule Take 1 capsule (20 mg total) by mouth daily. 06/16/17   Jaynee Eagles, PA-C  HYDROcodone-acetaminophen (NORCO/VICODIN) 5-325 MG tablet Take 1-2 tablets by mouth every 6 (six) hours as needed for severe pain. 02/05/18   Antonietta Breach, PA-C  ibuprofen (ADVIL,MOTRIN) 800 MG tablet Take by mouth. 01/01/18   [provider]  montelukast (SINGULAIR) 10 MG tablet Take 1 tablet (10 mg total) by mouth at bedtime. 01/02/18   Jaynee Eagles, PA-C  ondansetron (ZOFRAN ODT) 4 MG disintegrating tablet Take 1 tablet (4 mg total) by mouth every 8 (eight) hours as needed for nausea or vomiting. 02/05/18   Antonietta Breach, PA-C  Prenatal Vit-Fe Fumarate-FA (PRENATAL VITAMIN PO) Take  1 tablet by mouth daily. 05/19/17   [provider]  pseudoephedrine (SUDAFED 12 HOUR) 120 MG 12 hr tablet Take 1 tablet (120 mg total) by mouth 2 (two) times daily. 01/02/18   Jaynee Eagles, PA-C    Family History History reviewed. No pertinent family history.  Social History Social History   Tobacco Use  . Smoking status: Never Smoker  . Smokeless tobacco: Never Used  Substance Use Topics  . Alcohol use: No    Alcohol/week: 0.0 standard drinks    Frequency: Never  . Drug use: No     Allergies   Patient has no known allergies.   Review of Systems Review of Systems Ten systems reviewed and are negative for acute change, except as noted in the HPI.    Physical Exam Updated Vital Signs BP 120/67 (BP Location: Right Arm)   Pulse 67   Temp 98.1 F (36.7 C) (Oral)   Resp 18   LMP 04/11/2017   SpO2 100%   Physical Exam    Constitutional: She is oriented to person, place, and time. She appears well-developed and well-nourished. No distress.  Appears uncomfortable, nontoxic.  HENT:  Head: Normocephalic and atraumatic.  Eyes: Conjunctivae and EOM are normal. No scleral icterus.  Neck: Normal range of motion.  Cardiovascular: Normal rate, regular rhythm and intact distal pulses.  Pulmonary/Chest: Effort normal. No stridor. No respiratory distress. She has no wheezes.  Respirations even and unlabored. Lungs CTAB.  Abdominal: Soft. She exhibits no mass. There is tenderness. There is guarding (voluntary, RUQ). There is no rebound.  Soft abdomen with focal RUQ TTP. No peritoneal signs or palpable masses.  Musculoskeletal: Normal range of motion.  Neurological: She is alert and oriented to person, place, and time. She exhibits normal muscle tone. Coordination normal.  Skin: Skin is warm and dry. No rash noted. She is not diaphoretic. No erythema. No pallor.  Psychiatric: She has a normal mood and affect. Her behavior is normal.  Nursing note and vitals reviewed.    ED Treatments / Results  Labs (all labs ordered are listed, but only abnormal results are displayed) Labs Reviewed  COMPREHENSIVE METABOLIC PANEL - Abnormal; Notable for the following components:      Result Value   Glucose, Bld 100 (*)    AST 42 (*)    ALT 45 (*)    Alkaline Phosphatase 135 (*)    All other components within normal limits  CBC - Abnormal; Notable for the following components:   WBC 10.6 (*)    RDW 15.8 (*)    Platelets 412 (*)    All other components within normal limits  URINALYSIS, ROUTINE W REFLEX MICROSCOPIC - Abnormal; Notable for the following components:   Leukocytes, UA TRACE (*)    Bacteria, UA RARE (*)    All other components within normal limits  LIPASE, BLOOD  I-STAT BETA HCG BLOOD, ED ([facility], [facility], [facility] ONLY)    EKG None  Radiology US Abdomen Limited Ruq  Result Date: 02/05/2018 CLINICAL DATA:  Right upper  quadrant pain. Pain for weeks, worse tonight. EXAM: ULTRASOUND ABDOMEN LIMITED RIGHT UPPER QUADRANT COMPARISON:  None. FINDINGS: Gallbladder: Distended. No gallstones or wall thickening visualized. No pericholecystic fluid. Positive sonographic Murphy sign noted by sonographer. Common bile duct: Diameter: 4 mm. Liver: No focal lesion identified. Within normal limits in parenchymal echogenicity. Portal vein is patent on color Doppler imaging with normal direction of blood flow towards the liver. IMPRESSION: Mild gallbladder distention and positive sonographic Percell Miller  sign, findings of uncertain significance in the absence of gallstones, wall thickening or other signs of gallbladder inflammation. No biliary dilatation. Consider further evaluation with nuclear medicine HIDA scan or MRCP. Electronically Signed   By: Jeb Levering M.D.   On: 02/05/2018 00:23    Procedures Procedures (including critical care time)  Medications Ordered in ED Medications  metoCLOPramide (REGLAN) injection 10 mg (has no administration in time range)  ondansetron (ZOFRAN) injection 4 mg (4 mg Intravenous Given 02/05/18 0013)  sodium chloride 0.9 % bolus 1,000 mL (0 mLs Intravenous Stopped 02/05/18 0129)  HYDROmorphone (DILAUDID) injection 1 mg (1 mg Intravenous Given 02/05/18 0013)    12:15 AM Prior laboratory work up reviewed at [facility]. As follows:  Comprehensive Metabolic PanelResulted: 07/24/5619 3:06 PM [facility] Component Name Value Ref Range  Na 136 136 - 146 mmol/L  Potassium 4.7 3.7 - 5.4 mmol/L  Cl 102 97 - 108 mmol/L  CO2 20 20 - 32 mmol/L  Glucose 93 65 - 99 mg/dL  BUN 10 6 - 20 mg/dL  Creatinine 0.40 (L) 0.57 - 1 mg/dL  Ca 10.7 (H) 8.7 - 10.2 mg/dL  ALK PHOS 220 (H) 25 - 150 IU/L  T Bili <0.15 0 - 1.2 mg/dL  Total Protein 6.9 6 - 8.5 gm/dL  Alb 3.4 (L) 3.5 - 5.5 gm/dL  GLOBULIN 3.5 1.5 - 4.5 gm/dL  ALBUMIN/GLOBULIN RATIO 1.0 (L) 1.1 - 2.5   BUN/CREAT RATIO 25.0 11 - 26   ALT 55 (H) 0 - 40 IU/L    AST 27 0 - 40 IU/L   Comprehensive metabolic panelResulted: 3/0/8657 9:42 AM [facility] Component Name Value Ref Range  Na 135 (L) 136 - 146 mmol/L  Potassium 3.6 (L) 3.7 - 5.4 mmol/L  Cl 103 97 - 108 mmol/L  CO2 21 20 - 32 mmol/L  Glucose 115 (H) 65 - 99 mg/dL  BUN 7 6 - 20 mg/dL  Creatinine 0.60 0.57 - 1 mg/dL  Ca 10.8 (H) 8.7 - 10.2 mg/dL  ALK PHOS 222 (H) 25 - 150 IU/L  T Bili 0.22 0 - 1.2 mg/dL  Total Protein 6.4 6 - 8.5 gm/dL  Alb 3.1 (L) 3.5 - 5.5 gm/dL  GLOBULIN 3.3 1.5 - 4.5 gm/dL  ALBUMIN/GLOBULIN RATIO 0.9 (L) 1.1 - 2.5   BUN/CREAT RATIO 11.7 11 - 26   ALT 62 (H) 0 - 40 IU/L  AST 40 0 - 40 IU/L   Comprehensive metabolic panelResulted: 01/15/6961 12:57 AM [facility] Component Name Value Ref Range  Na 133 (L) 136 - 146 mmol/L  Potassium 3.5 (L) 3.7 - 5.4 mmol/L  Cl 101 97 - 108 mmol/L  CO2 21 20 - 32 mmol/L  Glucose 99 65 - 99 mg/dL  BUN 9 6 - 20 mg/dL  Creatinine 0.60 0.57 - 1 mg/dL  Ca 10.8 (H) 8.7 - 10.2 mg/dL  ALK PHOS 210 (H) 25 - 150 IU/L  T Bili 0.23 0 - 1.2 mg/dL  Total Protein 6.2 6 - 8.5 gm/dL  Alb 2.9 (L) 3.5 - 5.5 gm/dL  GLOBULIN 3.3 1.5 - 4.5 gm/dL  ALBUMIN/GLOBULIN RATIO 0.9 (L) 1.1 - 2.5   BUN/CREAT RATIO 15.0 11 - 26   ALT 62 (H) 0 - 40 IU/L  AST 45 (H) 0 - 40 IU/L    LFTs today are similar compared with laboratory work up from 7/4-7/8. Alk Phos has improved slightly.  Will obtain US and give IV medications for pain and nausea.  1:35 AM Patient reassessed.  Abdominal exam improved.  She has no focal tenderness or guarding in the right upper quadrant.  Abdomen remains soft.  No peritoneal signs.  2:17 AM Tolerated PO fluids without complications. Denies worsening pain.   Initial Impression / Assessment and Plan / ED Course  I have reviewed the triage vital signs and the nursing notes.  Pertinent labs & imaging results that were available during my care of the patient were reviewed by me and considered in my medical decision making  (see chart for details).     37 year old female presents to the emergency department for evaluation of right upper quadrant abdominal pain with nausea and vomiting.  She has had similar episodes of pain since late pregnancy.  History of cesarean section delivery approximately 5 weeks ago.  Patient initially with tenderness in the right upper quadrant as well as voluntary guarding.  No peritoneal signs on exam.  The patient is nontoxic in appearance.  She is afebrile.  Vital signs have been stable.  Laboratory work-up with very mild leukocytosis of 10.6.  She has elevation of her LFTs today, though these are largely unchanged from prior work-up at [facility].  Lipase WNL.  Ultrasound today shows distended gallbladder without wall thickening or pericholecystic fluid.  No evidence of gallstones or sludge.  Location of pain does favor gallbladder etiology.  Will require further work-up with HIDA scan.  The patient has had good symptomatic control in the emergency department with IV fluids and pain medications.  She has not improved repeat abdominal examination and is able to tolerate oral fluids without vomiting.  Have discussed plan for outpatient GI follow-up.  Patient expresses comfort and understanding with this plan.  She has been instructed to return to the emergency department for any new or concerning symptoms.  While Bentyl would be favored for pain control, patient is currently breast-feeding.  For this reason, she was discharged with a course of Norco and Zofran.  Return precautions discussed and provided. Patient discharged in stable condition with no unaddressed concerns.   Final Clinical Impressions(s) / ED Diagnoses   Final diagnoses:  Right upper quadrant abdominal pain  Transaminitis    ED Discharge Orders         Ordered    ondansetron (ZOFRAN ODT) 4 MG disintegrating tablet  Every 8 hours PRN     02/05/18 0223    HYDROcodone-acetaminophen (NORCO/VICODIN) 5-325 MG tablet   Every 6 hours PRN     02/05/18 0223           Antonietta Breach, PA-C 14/23/95 3202    Delora Fuel, MD 33/43/56 [phone]

## 2018-02-07 DIAGNOSIS — R1011 Right upper quadrant pain: Secondary | ICD-10-CM | POA: Diagnosis not present

## 2018-02-07 DIAGNOSIS — D6859 Other primary thrombophilia: Secondary | ICD-10-CM | POA: Diagnosis not present

## 2018-02-14 DIAGNOSIS — H18603 Keratoconus, unspecified, bilateral: Secondary | ICD-10-CM | POA: Diagnosis not present

## 2018-02-16 DIAGNOSIS — R1011 Right upper quadrant pain: Secondary | ICD-10-CM | POA: Diagnosis not present

## 2018-02-21 DIAGNOSIS — Z3009 Encounter for other general counseling and advice on contraception: Secondary | ICD-10-CM | POA: Diagnosis not present

## 2018-03-07 DIAGNOSIS — Z30430 Encounter for insertion of intrauterine contraceptive device: Secondary | ICD-10-CM | POA: Diagnosis not present

## 2018-03-10 DIAGNOSIS — Z23 Encounter for immunization: Secondary | ICD-10-CM | POA: Diagnosis not present

## 2018-03-28 DIAGNOSIS — Z23 Encounter for immunization: Secondary | ICD-10-CM | POA: Diagnosis not present

## 2018-04-10 ENCOUNTER — Encounter (HOSPITAL_COMMUNITY): Payer: Self-pay

## 2018-04-12 DIAGNOSIS — D225 Melanocytic nevi of trunk: Secondary | ICD-10-CM | POA: Diagnosis not present

## 2018-04-12 DIAGNOSIS — L723 Sebaceous cyst: Secondary | ICD-10-CM | POA: Diagnosis not present

## 2018-04-12 DIAGNOSIS — L918 Other hypertrophic disorders of the skin: Secondary | ICD-10-CM | POA: Diagnosis not present

## 2018-04-12 DIAGNOSIS — D224 Melanocytic nevi of scalp and neck: Secondary | ICD-10-CM | POA: Diagnosis not present

## 2018-04-12 DIAGNOSIS — L720 Epidermal cyst: Secondary | ICD-10-CM | POA: Diagnosis not present

## 2018-04-18 DIAGNOSIS — H18603 Keratoconus, unspecified, bilateral: Secondary | ICD-10-CM | POA: Diagnosis not present

## 2018-04-24 DIAGNOSIS — N939 Abnormal uterine and vaginal bleeding, unspecified: Secondary | ICD-10-CM | POA: Diagnosis not present

## 2018-05-06 ENCOUNTER — Other Ambulatory Visit: Payer: Self-pay | Admitting: Urgent Care

## 2018-06-06 ENCOUNTER — Ambulatory Visit (INDEPENDENT_AMBULATORY_CARE_PROVIDER_SITE_OTHER): Payer: 59 | Admitting: Family Medicine

## 2018-06-06 ENCOUNTER — Encounter: Payer: Self-pay | Admitting: Family Medicine

## 2018-06-06 ENCOUNTER — Other Ambulatory Visit: Payer: Self-pay

## 2018-06-06 VITALS — BP 124/76 | HR 90 | Temp 98.4°F | Resp 16 | Ht 64.0 in | Wt 156.2 lb

## 2018-06-06 DIAGNOSIS — Z1329 Encounter for screening for other suspected endocrine disorder: Secondary | ICD-10-CM | POA: Diagnosis not present

## 2018-06-06 DIAGNOSIS — Z13220 Encounter for screening for lipoid disorders: Secondary | ICD-10-CM | POA: Diagnosis not present

## 2018-06-06 DIAGNOSIS — Z0000 Encounter for general adult medical examination without abnormal findings: Secondary | ICD-10-CM | POA: Diagnosis not present

## 2018-06-06 DIAGNOSIS — Z1321 Encounter for screening for nutritional disorder: Secondary | ICD-10-CM

## 2018-06-06 DIAGNOSIS — Z13228 Encounter for screening for other metabolic disorders: Secondary | ICD-10-CM

## 2018-06-06 DIAGNOSIS — Z130 Encounter for screening for diseases of the blood and blood-forming organs and certain disorders involving the immune mechanism: Secondary | ICD-10-CM

## 2018-06-06 MED ORDER — FLUOXETINE HCL 20 MG PO CAPS: 20.0000 mg | ORAL_CAPSULE | Freq: Every day | ORAL | 1 refills | Status: DC

## 2018-06-06 NOTE — Patient Instructions (Signed)
If you have lab work done today you will be contacted with your lab results within the next 2 weeks.  If you have not heard from [place] then please contact us. The fastest way to get your results is to register for My Chart.   IF you received an x-ray today, you will receive an invoice from [facility]. Please contact [facility] at [phone] with questions or concerns regarding your invoice.   IF you received labwork today, you will receive an invoice from [facility]. Please contact [facility] at [phone] with questions or concerns regarding your invoice.   Our billing staff will not be able to assist you with questions regarding bills from these companies.  You will be contacted with the lab results as soon as they are available. The fastest way to get your results is to activate your My Chart account. Instructions are located on the last page of this paperwork. If you have not heard from [place] regarding the results in 2 weeks, please contact this office.      Preventive Care 18-39 Years, Female Preventive care refers to lifestyle choices and visits with your health care provider that can promote health and wellness. What does preventive care include?   A yearly physical exam. This is also called an annual well check.  Dental exams once or twice a year.  Routine eye exams. Ask your health care provider how often you should have your eyes checked.  Personal lifestyle choices, including: ? Daily care of your teeth and gums. ? Regular physical activity. ? Eating a healthy diet. ? Avoiding tobacco and drug use. ? Limiting alcohol use. ? Practicing safe sex. ? Taking vitamin and mineral supplements as recommended by your health care provider. What happens during an annual well check? The services and screenings done by your health care provider during your annual well check will depend on your age, overall health, lifestyle risk factors, and family history of  disease. Counseling Your health care provider may ask you questions about your:  Alcohol use.  Tobacco use.  Drug use.  Emotional well-being.  Home and relationship well-being.  Sexual activity.  Eating habits.  Work and work Statistician.  Method of birth control.  Menstrual cycle.  Pregnancy history. Screening You may have the following tests or measurements:  Height, weight, and BMI.  Diabetes screening. This is done by checking your blood sugar (glucose) after you have not eaten for a while (fasting).  Blood pressure.  Lipid and cholesterol levels. These may be checked every 5 years starting at age 20.  Skin check.  Hepatitis C blood test.  Hepatitis B blood test.  Sexually transmitted disease (STD) testing.  BRCA-related cancer screening. This may be done if you have a family history of breast, ovarian, tubal, or peritoneal cancers.  Pelvic exam and Pap test. This may be done every 3 years starting at age 27. Starting at age 19, this may be done every 5 years if you have a Pap test in combination with an HPV test. Discuss your test results, treatment options, and if necessary, the need for more tests with your health care provider. Vaccines Your health care provider may recommend certain vaccines, such as:  Influenza vaccine. This is recommended every year.  Tetanus, diphtheria, and acellular pertussis (Tdap, Td) vaccine. You may need a Td booster every 10 years.  Varicella vaccine. You may need this if you have not been vaccinated.  HPV vaccine. If you are 26 or younger,  you may need three doses over 6 months.  Measles, mumps, and rubella (MMR) vaccine. You may need at least one dose of MMR. You may also need a second dose.  Pneumococcal 13-valent conjugate (PCV13) vaccine. You may need this if you have certain conditions and were not previously vaccinated.  Pneumococcal polysaccharide (PPSV23) vaccine. You may need one or two doses if you smoke  cigarettes or if you have certain conditions.  Meningococcal vaccine. One dose is recommended if you are age 87-21 years and a first-year college student living in a residence hall, or if you have one of several medical conditions. You may also need additional booster doses.  Hepatitis A vaccine. You may need this if you have certain conditions or if you travel or work in places where you may be exposed to hepatitis A.  Hepatitis B vaccine. You may need this if you have certain conditions or if you travel or work in places where you may be exposed to hepatitis B.  Haemophilus influenzae type b (Hib) vaccine. You may need this if you have certain risk factors. Talk to your health care provider about which screenings and vaccines you need and how often you need them. This information is not intended to replace advice given to you by your health care provider. Make sure you discuss any questions you have with your health care provider. Document Released: 07/27/2001 Document Revised: 01/11/2017 Document Reviewed: 04/01/2015 Elsevier Interactive Patient Education  2019 [employer].

## 2018-06-06 NOTE — Progress Notes (Signed)
12/24/201911:11 AM  Sherry Ray [birth year], 37 y.o. female [id number]  Chief Complaint  Patient presents with  . Annual Exam    no pap    HPI:   Patient is a 37 y.o. female with past medical history significant for depression, clotting disorder who presents today for CPE  Cervical Cancer Screening: dec 2018, has mirena IUD HIV Screening: 2018 STI Screening: 2018 Seasonal Influenza Vaccination: oct 2019 Td/Tdap Vaccination: may 2019 Frequency of Dental evaluation: Q6 months Frequency of Eye evaluation: yearly, wears eyeglasses Postpartum 3 months, breastfeeding  Depression well controlled on prozac, requesting refill  Fall Risk  10/31/2017 08/19/2017 04/28/2017 07/14/2016 12/17/2015  Falls in the past year? No No No No No     Depression screen [facility] 2/9 06/06/2018 10/31/2017 04/28/2017  Decreased Interest 0 0 1  Down, Depressed, Hopeless 0 0 1  PHQ - 2 Score 0 0 2  Altered sleeping - - 1  Tired, decreased energy - - 1  Change in appetite - - 0  Feeling bad or failure about yourself  - - 1  Trouble concentrating - - 1  Moving slowly or fidgety/restless - - 0  Suicidal thoughts - - 0  PHQ-9 Score - - 6    No Known Allergies  Prior to Admission medications   Medication Sig Start Date End Date Taking? Authorizing Provider  cetirizine (ZYRTEC) 10 MG tablet Take 1 tablet by mouth daily.   Yes [provider]  FLUoxetine (PROZAC) 20 MG capsule Take 1 capsule (20 mg total) by mouth daily. 06/16/17  Yes Jaynee Eagles, PA-C  montelukast (SINGULAIR) 10 MG tablet Take 1 tablet (10 mg total) by mouth at bedtime. 01/02/18  Yes Jaynee Eagles, PA-C    Past Medical History:  Diagnosis Date  . Antithrombin deficiency ([place])    had a PE (2008) and DVT (1996), took coumadin for 1 year  . Anxiety and depression   . Depression   . DVT (deep venous thrombosis) ([place])   . Headache   . Pulmonary embolism ([place])   . Vaginal Pap smear, abnormal     Past Surgical History:    Procedure Laterality Date  . ESOPHAGOGASTRODUODENOSCOPY ENDOSCOPY    . FIRST RIB REMOVAL    . NASAL SINUS SURGERY      Social History   Tobacco Use  . Smoking status: Never Smoker  . Smokeless tobacco: Never Used  Substance Use Topics  . Alcohol use: No    Alcohol/week: 0.0 standard drinks    Frequency: Never    No family history on file.  Review of Systems  Constitutional: Negative for chills and fever.  HENT: Positive for congestion and ear pain.   Respiratory: Negative for cough and shortness of breath.   Cardiovascular: Negative for chest pain, palpitations and leg swelling.  Gastrointestinal: Negative for abdominal pain, nausea and vomiting.  All other systems reviewed and are negative.    OBJECTIVE:  Blood pressure 124/76, pulse 90, temperature 98.4 F (36.9 C), temperature source Oral, resp. rate 16, height '5\' 4"'  (1.626 m), weight 156 lb 3.2 oz (70.9 kg), SpO2 97 %, unknown if currently breastfeeding. Body mass index is 26.81 kg/m.   Physical Exam Vitals signs and nursing note reviewed.  Constitutional:      Appearance: She is well-developed.  HENT:     Head: Normocephalic and atraumatic.     Right Ear: Hearing, tympanic membrane, ear canal and external ear normal.     Left Ear: Hearing, tympanic membrane, ear  canal and external ear normal.  Eyes:     Conjunctiva/sclera: Conjunctivae normal.     Pupils: Pupils are equal, round, and reactive to light.  Neck:     Musculoskeletal: Neck supple.     Thyroid: No thyromegaly.  Cardiovascular:     Rate and Rhythm: Normal rate and regular rhythm.     Heart sounds: Normal heart sounds. No murmur. No friction rub. No gallop.   Pulmonary:     Effort: Pulmonary effort is normal.     Breath sounds: Normal breath sounds. No wheezing or rales.  Abdominal:     General: Bowel sounds are normal. There is no distension.     Palpations: Abdomen is soft. There is no mass.     Tenderness: There is no abdominal tenderness.   Musculoskeletal: Normal range of motion.  Lymphadenopathy:     Cervical: No cervical adenopathy.  Skin:    General: Skin is warm and dry.  Neurological:     Mental Status: She is alert and oriented to person, place, and time.     Cranial Nerves: No cranial nerve deficit.     Gait: Gait normal.     Deep Tendon Reflexes: Reflexes are normal and symmetric.       Visual Acuity Screening   Right eye Left eye Both eyes  Without correction:     With correction: 20/25-1 20/70 20/30    ASSESSMENT and PLAN  1. Annual physical exam Routine HCM labs ordered. HCM reviewed/discussed. Anticipatory guidance regarding healthy weight, lifestyle and choices given.   2. Screening for deficiency anemia - CBC with Differential/Platelet  3. Screening for endocrine, nutritional, metabolic and immunity disorder - CMP14+EGFR  4. Screening for thyroid disorder - TSH  5. Screening for lipid disorders - Lipid panel  Other orders Depression well controlled. Cont current regime - FLUoxetine (PROZAC) 20 MG capsule; Take 1 capsule (20 mg total) by mouth daily.    Return in about 1 year (around 06/07/2019) for CPE.    Rutherford Guys, MD Primary Care at [place], [place] [address] Ph.  [phone] Fax [phone]

## 2018-06-07 LAB — CMP14+EGFR
ALT: 40 IU/L — ABNORMAL HIGH (ref 0–32)
AST: 24 IU/L (ref 0–40)
Albumin/Globulin Ratio: 1.6 (ref 1.2–2.2)
Albumin: 4.4 g/dL (ref 3.5–5.5)
Alkaline Phosphatase: 123 IU/L — ABNORMAL HIGH (ref 39–117)
BUN/Creatinine Ratio: 29 — ABNORMAL HIGH (ref 9–23)
BUN: 20 mg/dL (ref 6–20)
Bilirubin Total: 0.2 mg/dL (ref 0.0–1.2)
CO2: 22 mmol/L (ref 20–29)
Calcium: 9.7 mg/dL (ref 8.7–10.2)
Chloride: 101 mmol/L (ref 96–106)
Creatinine, Ser: 0.68 mg/dL (ref 0.57–1.00)
GFR calc Af Amer: 129 mL/min/{1.73_m2} (ref >59)
GFR calc non Af Amer: 112 mL/min/{1.73_m2} (ref >59)
Globulin, Total: 2.7 g/dL (ref 1.5–4.5)
Glucose: 100 mg/dL — ABNORMAL HIGH (ref 65–99)
Potassium: 4.4 mmol/L (ref 3.5–5.2)
Sodium: 137 mmol/L (ref 134–144)
Total Protein: 7.1 g/dL (ref 6.0–8.5)

## 2018-06-07 LAB — CBC WITH DIFFERENTIAL/PLATELET
Basophils Absolute: 0 10*3/uL (ref 0.0–0.2)
Basos: 0 %
EOS (ABSOLUTE): 0.3 10*3/uL (ref 0.0–0.4)
Eos: 3 %
Hematocrit: 39.9 % (ref 34.0–46.6)
Hemoglobin: 12.8 g/dL (ref 11.1–15.9)
Immature Grans (Abs): 0 10*3/uL (ref 0.0–0.1)
Immature Granulocytes: 0 %
Lymphocytes Absolute: 3.2 10*3/uL — ABNORMAL HIGH (ref 0.7–3.1)
Lymphs: 39 %
MCH: 25.8 pg — ABNORMAL LOW (ref 26.6–33.0)
MCHC: 32.1 g/dL (ref 31.5–35.7)
MCV: 80 fL (ref 79–97)
Monocytes Absolute: 0.5 10*3/uL (ref 0.1–0.9)
Monocytes: 6 %
Neutrophils Absolute: 4.3 10*3/uL (ref 1.4–7.0)
Neutrophils: 52 %
Platelets: 330 10*3/uL (ref 150–450)
RBC: 4.96 x10E6/uL (ref 3.77–5.28)
RDW: 15.8 % — ABNORMAL HIGH (ref 12.3–15.4)
WBC: 8.3 10*3/uL (ref 3.4–10.8)

## 2018-06-07 LAB — TSH: TSH: 0.584 u[IU]/mL (ref 0.450–4.500)

## 2018-06-07 LAB — LIPID PANEL
Chol/HDL Ratio: 5.1 ratio — ABNORMAL HIGH (ref 0.0–4.4)
Cholesterol, Total: 226 mg/dL — ABNORMAL HIGH (ref 100–199)
HDL: 44 mg/dL (ref >39)
LDL Calculated: 125 mg/dL — ABNORMAL HIGH (ref 0–99)
Triglycerides: 287 mg/dL — ABNORMAL HIGH (ref 0–149)
VLDL Cholesterol Cal: 57 mg/dL — ABNORMAL HIGH (ref 5–40)

## 2018-08-21 DIAGNOSIS — H18622 Keratoconus, unstable, left eye: Secondary | ICD-10-CM | POA: Diagnosis not present

## 2019-02-14 ENCOUNTER — Other Ambulatory Visit (HOSPITAL_COMMUNITY): Payer: Self-pay | Admitting: Radiology

## 2019-02-15 ENCOUNTER — Other Ambulatory Visit: Payer: Self-pay

## 2019-02-15 DIAGNOSIS — Z20822 Contact with and (suspected) exposure to covid-19: Secondary | ICD-10-CM

## 2019-02-16 LAB — NOVEL CORONAVIRUS, NAA: SARS-CoV-2, NAA: NOT DETECTED

## 2019-03-19 ENCOUNTER — Other Ambulatory Visit: Payer: Self-pay

## 2019-03-19 DIAGNOSIS — Z20822 Contact with and (suspected) exposure to covid-19: Secondary | ICD-10-CM

## 2019-03-21 LAB — NOVEL CORONAVIRUS, NAA: SARS-CoV-2, NAA: NOT DETECTED

## 2019-05-22 ENCOUNTER — Encounter (HOSPITAL_COMMUNITY): Payer: Self-pay | Admitting: *Deleted

## 2019-05-23 ENCOUNTER — Ambulatory Visit (HOSPITAL_COMMUNITY): Payer: 59 | Admitting: *Deleted

## 2019-05-23 ENCOUNTER — Other Ambulatory Visit: Payer: Self-pay

## 2019-05-23 ENCOUNTER — Encounter (HOSPITAL_COMMUNITY): Payer: Self-pay

## 2019-05-23 ENCOUNTER — Ambulatory Visit (HOSPITAL_COMMUNITY): Payer: 59 | Attending: Obstetrics and Gynecology | Admitting: Obstetrics and Gynecology

## 2019-05-23 VITALS — BP 128/76 | HR 96 | Temp 98.1°F | Wt 152.0 lb

## 2019-05-23 DIAGNOSIS — O99111 Other diseases of the blood and blood-forming organs and certain disorders involving the immune mechanism complicating pregnancy, first trimester: Secondary | ICD-10-CM | POA: Insufficient documentation

## 2019-05-23 DIAGNOSIS — F329 Major depressive disorder, single episode, unspecified: Secondary | ICD-10-CM

## 2019-05-23 DIAGNOSIS — O34219 Maternal care for unspecified type scar from previous cesarean delivery: Secondary | ICD-10-CM | POA: Diagnosis not present

## 2019-05-23 DIAGNOSIS — O0990 Supervision of high risk pregnancy, unspecified, unspecified trimester: Secondary | ICD-10-CM

## 2019-05-23 DIAGNOSIS — D6859 Other primary thrombophilia: Secondary | ICD-10-CM | POA: Diagnosis not present

## 2019-05-23 DIAGNOSIS — Z98891 History of uterine scar from previous surgery: Secondary | ICD-10-CM | POA: Insufficient documentation

## 2019-05-23 DIAGNOSIS — O99341 Other mental disorders complicating pregnancy, first trimester: Secondary | ICD-10-CM

## 2019-05-23 DIAGNOSIS — Z3A01 Less than 8 weeks gestation of pregnancy: Secondary | ICD-10-CM | POA: Insufficient documentation

## 2019-05-23 DIAGNOSIS — Z79899 Other long term (current) drug therapy: Secondary | ICD-10-CM | POA: Insufficient documentation

## 2019-05-23 DIAGNOSIS — Z86718 Personal history of other venous thrombosis and embolism: Secondary | ICD-10-CM

## 2019-05-23 DIAGNOSIS — Z8759 Personal history of other complications of pregnancy, childbirth and the puerperium: Secondary | ICD-10-CM

## 2019-05-23 NOTE — Progress Notes (Signed)
Maternal-Fetal Medicine   Name: Sherry Ray MRN: [id number] Requesting Provider: Dr. Marylynn Pearson, MD  I had the pleasure of seeing Sherry Ray today at the [facility].  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 Past medical history is significant for 2 episodes of venous thromboembolism.  In 1996, the patient had "blood clot in her left shoulder" and cervical rib was also diagnosed that was subsequently removed.   In 2008, she had pulmonary embolism that was treated with anticoagulants including warfarin. She was taking Nuvaring contraception.  After anticoagulation treatment was completed the patient underwent thrombophilia work-up that detected Antithrombin III deficiency.  On subsequent blood work in 2015 and 2018, Antithrombin III deficiency was seen and they were always less than 50%.  Patient did not have any further episodes of deep vein thrombosis (DVT) or pulmonary embolism.  She does not have diabetes or hypertension or any other chronic medical conditions.  Patient gives history of depression and she was taking Prozac in her last pregnancy.  Obstetric history is significant for a term cesarean delivery in July 2019 at [facility].  She did not have any recurrence of venous thromboembolism in the pregnancy.  She was taking therapeutic dosage of Lovenox that was titrated on anti-factor Xa levels.  Before delivery, she was taking Lovenox 160 mg twice daily.   Her pregnancy was also complicated by cholestasis and she underwent induction of labor at [redacted] weeks gestation.  Cesarean section was performed after labor failed to progress.  She delivered a healthy female infant Sherry Ray) weighing 7-2 at birth.  Her son is in good health now.  Patient reports her blood pressure was slightly elevated at the end of her pregnancy. In her last pregnancy, she had right upper quadrant  ultrasound that showed positive Murphy sign.  No sonographic evidence of gallstones was seen.  Past surgical history: Cervical rib removal, cesarean section, LEEP. Medications: Lovenox 100 mg subcutaneous twice daily, prenatal vitamins, aspirin 81 mg twice daily, Zyrtec as needed, vitamin B6. Allergies: No known drug allergies. Social history: Denies tobacco drug or alcohol use.  She has been married more than 2 years and her husband is in good health. He is Caucasian. Family history: No history of venous thromboembolism in the family.  Her parents are in good health. GYN history: History of abnormal Pap smear in 2012 followed by LEEP.  Most recent Pap smear performed in September 2019 was reported as normal.  She gives no history of breast disease.  She had IUD insertion after venous childbirth that was removed recently.  She is unsure of her LMP.  Counseled the patient on the following: Antithrombin III deficiency:  -Antithrombin III is plasma glycoprotein synthesized in the liver.  It inhibits thrombin and by means also the factor Xa and heparin.  It serves as a Control and instrumentation engineer in preventing thromboembolism.  Hereditary antithrombin III deficiency is prevalent in about 1 and 2000 to 1 in 5000 population.   -AT III Levels could not be estimated while on anticoagulation or if there is thrombosis.  -I reassured her that inherited thrombophilia including Antithrombin III deficiency is not associated with increased risks of pregnancy losses, fetal growth restriction, preeclampsia, stillbirth, and placental abruption.  -Patient had 2 episodes of thrombosis and her hematologist had not advised her long-term anticoagulation.  She did not take heparin after her previous pregnancy.  She  also did not have any recurrence of thromboembolism since 2008 when she had pulmonary embolism.  -My recommendation would be to continue Lovenox 100 mg twice daily throughout her pregnancy without adjusting  the dosage according to anti-Xa factor levels.  In women with high risk thrombophilia and 2 or more episodes of VTE and were not on long-term anticoagulation, intermediate dosage or adjusted dosage of heparin can be given ([place], Vol 132, No. 1, July 2018).  -However, I would like to obtain a hematologist consultation to make sure that she is not a candidate for long-term anticoagulation.  Hematologist opinion could be different and may recommend adjusted dosage according to anti-Xa levels.  -Discussed side effects of heparin including rashes, thrombocytopenia and a small likelihood of osteoporosis and vertebral fracture (1% to 2%). If vertebral fractures occur, patient may experience severe back pain.  -Overall, I feel her risk of thromboembolism is between 10% to 40% and likely to be lower because she did not have any recurrence of DVT or PE. -Ideally, lovenox should be continued just before planned delivery when unfractionated heparin can be substituted.  Dosage of unfractionated heparin is 10,000 units or more twice daily.  APTT levels should be 1.5 to 2.5 times the control.  -If elective cesarean section is planned at 39 weeks Lovenox may be stopped 24 to 48 hours before delivery and unfractionated heparin (UFH) can be substituted.  UFH should be withheld at least 12 hours before regional anesthesia and consultation with anesthesiologist should be obtained before delivery.  -Lovenox can be resumed 24 hours after delivery if regional anesthesia was given.  UFH can be started 8 to 12 hours after cesarean delivery and 4 to 6 hours after vaginal delivery. -I reassured her that she can deliver at [facility] [place].  -I also discussed other measures that is likely to reduce the likelihood of recurrence of DVT/PE. Prolonged travel in sitting position to be avoided. Patient is not obese and is not a smoker.  Advanced maternal age: Patient was counseled on increased risk for  fetal aneuploidies with advanced maternal age.  Heparin can interfere with all forms of screening for aneuploidies including cell-free fetal DNA screening (heparin effect on apoptosis of trophoblasts). I informed her that only invasive testing (preferably amniocentesis as opposed to chorionic villus sampling because of high dose of lovenox) will give a definitive result on the fetal karyotype. Patient prefers to have a detailed fetal anatomy scan at 18 to 20 weeks and decide on invasive testing.  Previous cesarean delivery: I discussed VBAC, its benefits and risks including scar dehiscence (1%). I also informed her that repeat cesarean deliveries increase the risk of placenta previa or accreta. Patient will decide on mode of delivery after discussing with you. She seems to be keen on repeat cesarean section now.  History of depression: I reassured the patient that antidepressants can be safely given in pregnancy. Absolute risk of primary pulmonary hypertension is very small.  Recommendations: -Continue lovenox 100 mg twice daily throughout her pregnancy. -Hematologist consultation. -Lovenox to be continued till 24 to 48 hours before planned delivery (39 weeks). -If unfractionated heparin treatment is decided at 36 to 37 weeks, heparin 10,000 units or more twice daily to be given. Goal is to keep aPTT at 1.5 to 2.5 times the control.  -Anesthesiologist to be consulted before planned induction/cesarean delivery. -Lovenox to be resumed 24 hours after regional anesthesia. -UFH to resume 4 to 6 hours after vaginal delivery and 8-12 hours  after cesarean delivery. -Detailed fetal anatomy scan at 18 to 20 weeks with MFM (with follow-up consultation). -Fetal growth assessment at 32 weeks.  Thank you for your consult. Please do not hesitate to contact me if you have any questions or concerns.   Consultation including face-to-face counseling: 60 min.

## 2019-06-04 ENCOUNTER — Telehealth: Payer: Self-pay | Admitting: Hematology and Oncology

## 2019-06-04 NOTE — Telephone Encounter (Signed)
Returned patient's phone call regarding scheduling an appointment, left a voicemail. 

## 2019-06-14 ENCOUNTER — Telehealth: Payer: Self-pay | Admitting: Hematology and Oncology

## 2019-06-14 NOTE — Telephone Encounter (Signed)
Scheduled appt per 12/31 sch message - unable to reach pt - left message on mobile with appt date and time

## 2019-06-17 NOTE — Progress Notes (Signed)
Patient Care Team: Patient, No Pcp Per as PCP - General (General Practice)  DIAGNOSIS:    ICD-10-CM   1. Antithrombin deficiency ([place])  D68.59     CHIEF COMPLIANT: Follow-up of antithrombin deficiency  INTERVAL HISTORY: Sherry Ray is a 39 y.o. with above-mentioned history of antithrombin deficiency who was treated with antithrombin III and Lovenox after delivering her son by c-section in 12/2017. She presents to the clinic today for follow-up.  She is currently [redacted] weeks pregnant and was started on Lovenox injections at 100 mcg.  She wants to have her baby delivered at [facility] and therefore she wants to set up hematology here.  She has seen maternal-fetal medicine and they have given the recommendations to keep her at Lovenox dosage of 100 mcg daily.  These are being prescribed by OB.  Previous pregnancy delivery time she was given 3 doses of Thrombate.   ALLERGIES:  has No Known Allergies.  MEDICATIONS:  Current Outpatient Medications  Medication Sig Dispense Refill  . aspirin EC 81 MG tablet Take 81 mg by mouth daily.    . cetirizine (ZYRTEC) 10 MG tablet Take 1 tablet by mouth daily.    . Doxylamine Succinate, Sleep, (UNISOM PO) Take by mouth.    . enoxaparin (LOVENOX) 100 MG/ML injection Inject 100 mg into the skin every 12 (twelve) hours.    . Prenatal Vit-Fe Fumarate-FA (PRENATAL VITAMINS PO) Take by mouth.    . Pyridoxine HCl (VITAMIN B6 PO) Take by mouth.     No current facility-administered medications for this visit.    PHYSICAL EXAMINATION: ECOG PERFORMANCE STATUS: 1 - Symptomatic but completely ambulatory  Vitals:   06/18/19 1453  BP: (!) 141/77  Pulse: 100  Resp: 18  Temp: 97.8 F (36.6 C)  SpO2: 98%   Filed Weights   06/18/19 1453  Weight: 156 lb 1.6 oz (70.8 kg)      LABORATORY DATA:  I have reviewed the data as listed CMP Latest Ref Rng & Units 06/06/2018 02/04/2018 04/28/2017  Glucose 65 - 99 mg/dL 100(H) 100(H) 65  BUN 6 - 20 mg/dL  20 17 12   Creatinine 0.57 - 1.00 mg/dL 0.68 0.80 0.74  Sodium 134 - 144 mmol/L 137 141 141  Potassium 3.5 - 5.2 mmol/L 4.4 3.9 4.2  Chloride 96 - 106 mmol/L 101 105 101  CO2 20 - 29 mmol/L 22 26 23   Calcium 8.7 - 10.2 mg/dL 9.7 9.5 9.0  Total Protein 6.0 - 8.5 g/dL 7.1 7.2 7.2  Total Bilirubin 0.0 - 1.2 mg/dL 0.2 0.4 <0.2  Alkaline Phos 39 - 117 IU/L 123(H) 135(H) 82  AST 0 - 40 IU/L 24 42(H) 22  ALT 0 - 32 IU/L 40(H) 45(H) 26    Lab Results  Component Value Date   WBC 8.3 06/06/2018   HGB 12.8 06/06/2018   HCT 39.9 06/06/2018   MCV 80 06/06/2018   PLT 330 06/06/2018   NEUTROABS 4.3 06/06/2018    ASSESSMENT & PLAN:  Antithrombin deficiency ([place]) 02/13/2007 bilateral pulmonary emboli bilateral lower lobes and right middle lobe, right lower lobe pulmonary infarct 3 cm exophytic lesion right scapula with aggressive features question chondrosarcoma  Hypercoagulable workup in 2008 reviewed mild Antithrombin deficiency of 45% Since then patient had Antithrombin levels checked and they had been between 40-50%.  Patient delivered July 2020 baby boy at [facility] by C-section. She received Antithrombin infusions 3 doses postpartum.  Current pregnancy: 11 weeks expected date of delivery January 05, 2020  Current treatment: Lovenox injections 100 mcg subcu daily being prescribed by physicians program and OB/GYN  When it comes time to delivery she may require either plasma or Thrombate Goal of Antithrombin III at the time of delivery: 80 to 120% Dosing:Target % - trough % x body weight (kg) divided by 1.4% UNITS  Monitoring during Thrombate infusion:  1. 20 minutes (peak) post-injection 2. at least every 12 hours post-injection 3. Pre-injection (trough) Doses given over 10 to 20 minutes  I will see her December 13, 2019 to discuss the plan for peripartum care.   No orders of the defined types were placed in this encounter.  The patient has a good understanding of the overall plan.  she agrees with it. she will call with any problems that may develop before the next visit here.  Nicholas Lose, MD 06/18/2019  Julious Oka Dorshimer, am acting as scribe for Dr. Nicholas Lose.  I have reviewed the above document for accuracy and completeness, and I agree with the above.

## 2019-06-18 ENCOUNTER — Inpatient Hospital Stay: Payer: 59 | Attending: Hematology and Oncology | Admitting: Hematology and Oncology

## 2019-06-18 ENCOUNTER — Other Ambulatory Visit: Payer: Self-pay

## 2019-06-18 DIAGNOSIS — Z86711 Personal history of pulmonary embolism: Secondary | ICD-10-CM | POA: Diagnosis not present

## 2019-06-18 DIAGNOSIS — D6859 Other primary thrombophilia: Secondary | ICD-10-CM | POA: Diagnosis not present

## 2019-06-18 DIAGNOSIS — Z3A11 11 weeks gestation of pregnancy: Secondary | ICD-10-CM | POA: Insufficient documentation

## 2019-06-18 DIAGNOSIS — Z7901 Long term (current) use of anticoagulants: Secondary | ICD-10-CM | POA: Diagnosis not present

## 2019-06-18 DIAGNOSIS — O99112 Other diseases of the blood and blood-forming organs and certain disorders involving the immune mechanism complicating pregnancy, second trimester: Secondary | ICD-10-CM | POA: Diagnosis present

## 2019-06-18 DIAGNOSIS — Z7982 Long term (current) use of aspirin: Secondary | ICD-10-CM | POA: Diagnosis not present

## 2019-06-18 NOTE — Assessment & Plan Note (Signed)
02/13/2007 bilateral pulmonary emboli bilateral lower lobes and right middle lobe, right lower lobe pulmonary infarct 3 cm exophytic lesion right scapula with aggressive features question chondrosarcoma  Hypercoagulable workup in 2008 reviewed mild Antithrombin deficiency of 45% Since then patient had Antithrombin levels checked and they had been between 40-50%. Patient delivered July 2020 baby boy at [facility] by C-section. She received Antithrombin infusions 3 doses postpartum.  RTC

## 2019-06-19 ENCOUNTER — Telehealth: Payer: Self-pay | Admitting: Hematology and Oncology

## 2019-06-19 NOTE — Telephone Encounter (Signed)
I talk with patient regarding schedule  

## 2019-06-28 ENCOUNTER — Other Ambulatory Visit (HOSPITAL_COMMUNITY): Payer: Self-pay | Admitting: Obstetrics and Gynecology

## 2019-06-28 DIAGNOSIS — Z3689 Encounter for other specified antenatal screening: Secondary | ICD-10-CM

## 2019-06-28 DIAGNOSIS — Z3A19 19 weeks gestation of pregnancy: Secondary | ICD-10-CM

## 2019-07-05 ENCOUNTER — Ambulatory Visit (INDEPENDENT_AMBULATORY_CARE_PROVIDER_SITE_OTHER): Payer: 59 | Admitting: Adult Health Nurse Practitioner

## 2019-07-05 ENCOUNTER — Encounter: Payer: Self-pay | Admitting: Adult Health Nurse Practitioner

## 2019-07-05 ENCOUNTER — Other Ambulatory Visit: Payer: Self-pay

## 2019-07-05 VITALS — BP 118/70 | HR 103 | Temp 98.3°F | Ht 64.0 in | Wt 158.2 lb

## 2019-07-05 DIAGNOSIS — J069 Acute upper respiratory infection, unspecified: Secondary | ICD-10-CM | POA: Diagnosis not present

## 2019-07-05 MED ORDER — AMOXICILLIN 875 MG PO TABS: 875.0000 mg | ORAL_TABLET | Freq: Two times a day (BID) | ORAL | 0 refills | Status: DC

## 2019-07-05 MED ORDER — AZELASTINE HCL 0.1 % NA SOLN: 2.0000 | Freq: Two times a day (BID) | NASAL | 1 refills | Status: DC

## 2019-07-05 NOTE — Patient Instructions (Signed)
° ° ° °  If you have lab work done today you will be contacted with your lab results within the next 2 weeks.  If you have not heard from us then please contact us. The fastest way to get your results is to register for My Chart. ° ° °IF you received an x-ray today, you will receive an invoice from [facility]. Please contact [facility] at [phone] with questions or concerns regarding your invoice.  ° °IF you received labwork today, you will receive an invoice from [facility]. Please contact [facility] at [phone] with questions or concerns regarding your invoice.  ° °Our billing staff will not be able to assist you with questions regarding bills from these companies. ° °You will be contacted with the lab results as soon as they are available. The fastest way to get your results is to activate your My Chart account. Instructions are located on the last page of this paperwork. If you have not heard from us regarding the results in 2 weeks, please contact this office. °  ° ° ° °

## 2019-07-05 NOTE — Progress Notes (Signed)
     SUBJECTIVE:  Sherry Ray is a 39 y.o. female who complains of coryza, congestion, sneezing and post nasal drip for 3 days. She denies a history of wheezing and cough and denies a history of asthma. Patient denies smoke cigarettes.  She is [redacted] weeks pregnant and also still breast feeding her toddler.  She has taken Flonase and Zyrtec for relief yesterday without improvement. Denies loss of taste or smell.    OBJECTIVE: She appears well, vital signs are as noted. Ears normal.  Throat and pharynx normal.  Neck supple. No adenopathy in the neck. Nose is congested. Sinuses non tender. The chest is clear, without wheezes or rales.  ASSESSMENT:  viral upper respiratory illness  PLAN: Symptomatic therapy suggested: push fluids, rest, gargle warm salt water and return office visit prn if symptoms persist or worsen. Lack of antibiotic effectiveness discussed with her.  Will place an antibiotic on file for her at the pharmacy but have cautioned her against taking unless symptoms significantly persist or worsen. Call or return to clinic prn if these symptoms worsen or fail to improve as anticipated.She is inline with this plan.   Glyn Ade, NP

## 2019-07-19 ENCOUNTER — Other Ambulatory Visit: Payer: Self-pay

## 2019-07-19 ENCOUNTER — Inpatient Hospital Stay: Payer: 59 | Attending: Hematology and Oncology

## 2019-07-19 ENCOUNTER — Telehealth: Payer: Self-pay | Admitting: Hematology and Oncology

## 2019-07-19 ENCOUNTER — Other Ambulatory Visit: Payer: Self-pay | Admitting: *Deleted

## 2019-07-19 DIAGNOSIS — O99112 Other diseases of the blood and blood-forming organs and certain disorders involving the immune mechanism complicating pregnancy, second trimester: Secondary | ICD-10-CM | POA: Insufficient documentation

## 2019-07-19 DIAGNOSIS — D6859 Other primary thrombophilia: Secondary | ICD-10-CM | POA: Insufficient documentation

## 2019-07-19 LAB — HEPARIN ANTI-XA: Heparin LMW: 0.48 IU/mL

## 2019-07-19 NOTE — Telephone Encounter (Signed)
I left a voicemail for the patient that the antiten a level came back at 0.48 which means that she is taking less than the therapeutic dose.  The range should be 1-2.  I recommended that she increase the dosage of Lovenox from 100 mcg to 120 mcg.  I instructed her to call us back when she gets the message.

## 2019-07-20 ENCOUNTER — Other Ambulatory Visit: Payer: Self-pay | Admitting: *Deleted

## 2019-07-20 MED ORDER — ENOXAPARIN SODIUM 120 MG/0.8ML ~~LOC~~ SOLN: 120.0000 mg | Freq: Two times a day (BID) | SUBCUTANEOUS | 60 refills | Status: DC

## 2019-07-20 MED ORDER — ENOXAPARIN SODIUM 120 MG/0.8ML ~~LOC~~ SOLN: 120.0000 mg | Freq: Two times a day (BID) | SUBCUTANEOUS | 0 refills | Status: DC

## 2019-07-24 ENCOUNTER — Telehealth (INDEPENDENT_AMBULATORY_CARE_PROVIDER_SITE_OTHER): Payer: 59 | Admitting: Family Medicine

## 2019-07-24 ENCOUNTER — Other Ambulatory Visit: Payer: Self-pay

## 2019-07-24 ENCOUNTER — Encounter: Payer: Self-pay | Admitting: Family Medicine

## 2019-07-24 VITALS — Temp 97.5°F

## 2019-07-24 DIAGNOSIS — D6869 Other thrombophilia: Secondary | ICD-10-CM | POA: Diagnosis not present

## 2019-07-24 DIAGNOSIS — R0981 Nasal congestion: Secondary | ICD-10-CM | POA: Diagnosis not present

## 2019-07-24 DIAGNOSIS — J029 Acute pharyngitis, unspecified: Secondary | ICD-10-CM

## 2019-07-24 DIAGNOSIS — R0982 Postnasal drip: Secondary | ICD-10-CM | POA: Diagnosis not present

## 2019-07-24 NOTE — Progress Notes (Signed)
Telemedicine Encounter- SOAP NOTE Established Patient  This telephone encounter was conducted with the patient's (or proxy's) verbal consent via audio telecommunications: yes/no: Yes Patient was instructed to have this encounter in a suitably private space; and to only have persons present to whom they give permission to participate. In addition, patient identity was confirmed by use of name plus two identifiers (DOB and address).  I discussed the limitations, risks, security and privacy concerns of performing an evaluation and management service by telephone and the availability of in person appointments. I also discussed with the patient that there may be a patient responsible charge related to this service. The patient expressed understanding and agreed to proceed.  I spent a total of TIME; 0 MIN TO 60 MIN: 15 minutes talking with the patient or their proxy.  Chief Complaint  Patient presents with  . Sore Throat    Pt stated that it flarred up last friday. Pt stated that she has been taken amoxicillian but it has not cleared up and the pain is getting worse and harder for her to swallow.    Subjective   Sherry Ray is a 39 y.o. established patient. Telephone visit today for  HPI  She reports intermittent sore throat and postnasal drip She is [redacted] weeks pregnant and is on anticoagulation with lovenox for dvt prophylaxis She states that she has bloody nosebleeds as a result She states that she had a cold in December 2020 She saw S. Felton Clinton who told her to try nasal sprays She tried nasal sprays which cleared up the swelling and pressure in the sinus and the nosebleeds got worse This made the postnasal drip worse  She wakes up with a dry sore throat On Friday the sore throat started getting worse She took amoxicillin since Friday but it is not ehlping She went to UC on Sunday and her strep test was negative It hurts to eat or swallow and is not sleeping well  She was advised  to finish the amoxicillin She talked to her OB and was told to finish it out She contacted ENT and they have an appt for her in  She had a negative COVID test  She is gargling as well  She is using the humidifier She was prescribed a numbing spray from urgent care.   Patient Active Problem List   Diagnosis Date Noted  . Upper respiratory tract infection 07/05/2019  . Antithrombin deficiency ([place]) 07/14/2016  . Depression with anxiety 12/17/2015    Past Medical History:  Diagnosis Date  . Antithrombin deficiency ([place])    had a PE (2008) and DVT (1996), took coumadin for 1 year  . Anxiety and depression   . Depression   . DVT (deep venous thrombosis) ([place])   . Headache   . History of cholestasis during pregnancy   . Pulmonary embolism ([place])   . Vaginal Pap smear, abnormal     Current Outpatient Medications  Medication Sig Dispense Refill  . amoxicillin (AMOXIL) 875 MG tablet Take 1 tablet (875 mg total) by mouth 2 (two) times daily. 20 tablet 0  . aspirin EC 81 MG tablet Take 81 mg by mouth daily.    . cetirizine (ZYRTEC) 10 MG tablet Take 1 tablet by mouth daily.    Marland Kitchen enoxaparin (LOVENOX) 120 MG/0.8ML injection Inject 0.8 mLs (120 mg total) into the skin every 12 (twelve) hours. 48 mL 0  . Prenatal Vit-Fe Fumarate-FA (PRENATAL VITAMINS PO) Take by mouth.    Marland Kitchen  azelastine (ASTELIN) 0.1 % nasal spray Place 2 sprays into both nostrils 2 (two) times daily. Use in each nostril as directed (Patient not taking: Reported on 07/24/2019) 30 mL 1  . Doxylamine Succinate, Sleep, (UNISOM PO) Take by mouth.    . Pyridoxine HCl (VITAMIN B6 PO) Take by mouth.     No current facility-administered medications for this visit.    No Known Allergies  Social History   Socioeconomic History  . Marital status: Married    Spouse name: Not on file  . Number of children: Not on file  . Years of education: Not on file  . Highest education level: Not on file  Occupational History  . Not on file    Tobacco Use  . Smoking status: Never Smoker  . Smokeless tobacco: Never Used  Substance and Sexual Activity  . Alcohol use: No    Alcohol/week: 0.0 standard drinks  . Drug use: No  . Sexual activity: Yes    Birth control/protection: None  Other Topics Concern  . Not on file  Social History Narrative  . Not on file   Social Determinants of Health   Financial Resource Strain:   . Difficulty of Paying Living Expenses: Not on file  Food Insecurity:   . Worried About Charity fundraiser in the Last Year: Not on file  . Ran Out of Food in the Last Year: Not on file  Transportation Needs:   . Lack of Transportation (Medical): Not on file  . Lack of Transportation (Non-Medical): Not on file  Physical Activity:   . Days of Exercise per Week: Not on file  . Minutes of Exercise per Session: Not on file  Stress:   . Feeling of Stress : Not on file  Social Connections:   . Frequency of Communication with Friends and Family: Not on file  . Frequency of Social Gatherings with Friends and Family: Not on file  . Attends Religious Services: Not on file  . Active Member of Clubs or Organizations: Not on file  . Attends Archivist Meetings: Not on file  . Marital Status: Not on file  Intimate Partner Violence:   . Fear of Current or Ex-Partner: Not on file  . Emotionally Abused: Not on file  . Physically Abused: Not on file  . Sexually Abused: Not on file    ROS Review of Systems  See hpi Respiratory: Negative for cough, shortness of breath and wheezing.   Gastrointestinal: Negative for diarrhea, nausea and vomiting.  Genitourinary: Negative for difficulty urinating, dysuria, flank pain and hematuria.  Musculoskeletal: Negative for back pain, joint swelling and neck pain.  Neurological: Negative for dizziness, speech difficulty, light-headedness and numbness.  See HPI. All other review of systems negative.   Objective    Vitals as reported by the patient: 97.5  F Patient crying Normal pulmonary effort Today's Vitals   07/24/19 0928  Pulse: 12    Sherry Ray was seen today for sore throat.  Diagnoses and all orders for this visit:  Acquired thrombophilia ([place])  Sore throat  Sinus congestion  Postnasal drip   Advised pt to continue to gargle, take tylenol prn pain, continue amoxicillin She already has ENT follow up   I discussed the assessment and treatment plan with the patient. The patient was provided an opportunity to ask questions and all were answered. The patient agreed with the plan and demonstrated an understanding of the instructions.   The patient was advised to call back or  seek an in-person evaluation if the symptoms worsen or if the condition fails to improve as anticipated.  I provided 15 minutes of non-face-to-face time during this encounter.  Forrest Moron, MD  Primary Care at [facility]

## 2019-08-02 ENCOUNTER — Other Ambulatory Visit: Payer: Self-pay | Admitting: Hematology and Oncology

## 2019-08-07 ENCOUNTER — Other Ambulatory Visit: Payer: Self-pay

## 2019-08-07 ENCOUNTER — Telehealth (INDEPENDENT_AMBULATORY_CARE_PROVIDER_SITE_OTHER): Payer: 59 | Admitting: Registered Nurse

## 2019-08-07 DIAGNOSIS — B370 Candidal stomatitis: Secondary | ICD-10-CM

## 2019-08-07 MED ORDER — NYSTATIN 100000 UNIT/ML MT SUSP: 5.0000 mL | Freq: Four times a day (QID) | OROMUCOSAL | 0 refills | Status: DC

## 2019-08-07 NOTE — Progress Notes (Signed)
Patient states she took a course of antibiotics for a sore throat and about a week later she started to develop thrush on her tongue. Patient stated that she has tried to do cold greek yogurts , and mouth wash with peroxide in it. She stated she is ready for it go away because she wants to eat regular things being that she is [redacted] weeks pregnant. Throat and nose issues for months that keeps coming , and going.

## 2019-08-07 NOTE — Progress Notes (Signed)
Telemedicine Encounter- SOAP NOTE Established Patient  This telephone encounter was conducted with the patient's (or proxy's) verbal consent via audio telecommunications: yes  Patient was instructed to have this encounter in a suitably private space; and to only have persons present to whom they give permission to participate. In addition, patient identity was confirmed by use of name plus two identifiers (DOB and address).  I discussed the limitations, risks, security and privacy concerns of performing an evaluation and management service by telephone and the availability of in person appointments. I also discussed with the patient that there may be a patient responsible charge related to this service. The patient expressed understanding and agreed to proceed.  I spent a total of 11 minutes talking with the patient or their proxy.  No chief complaint on file.   Subjective   Sherry Ray is a 39 y.o. established patient. Telephone visit today for oral thrush.   HPI Recent course of abx for URI. Shortly after course, started developing white/yellow plaques on tongue, particularly towards throat. Taste has diminished. Some burning in the back of her throat, particularly when swallowing. No globus sensation, cough, gerd, URI symptoms, nvd.  Has not had thrush before.  Patient Active Problem List   Diagnosis Date Noted  . Upper respiratory tract infection 07/05/2019  . Antithrombin deficiency ([place]) 07/14/2016  . Depression with anxiety 12/17/2015    Past Medical History:  Diagnosis Date  . Antithrombin deficiency ([place])    had a PE (2008) and DVT (1996), took coumadin for 1 year  . Anxiety and depression   . Depression   . DVT (deep venous thrombosis) ([place])   . Headache   . History of cholestasis during pregnancy   . Pulmonary embolism ([place])   . Vaginal Pap smear, abnormal     Current Outpatient Medications  Medication Sig Dispense Refill  . amoxicillin (AMOXIL) 875 MG  tablet Take 1 tablet (875 mg total) by mouth 2 (two) times daily. 20 tablet 0  . aspirin EC 81 MG tablet Take 81 mg by mouth daily.    . cetirizine (ZYRTEC) 10 MG tablet Take 1 tablet by mouth daily.    . Doxylamine Succinate, Sleep, (UNISOM PO) Take by mouth.    . enoxaparin (LOVENOX) 120 MG/0.8ML injection INJECT 0.8 ML (120 MG TOTAL) UNDER THE SKIN EVERY 12 HOURS 48 mL 0  . Prenatal Vit-Fe Fumarate-FA (PRENATAL VITAMINS PO) Take by mouth.    . Pyridoxine HCl (VITAMIN B6 PO) Take by mouth.    Marland Kitchen azelastine (ASTELIN) 0.1 % nasal spray Place 2 sprays into both nostrils 2 (two) times daily. Use in each nostril as directed (Patient not taking: Reported on 07/24/2019) 30 mL 1  . nystatin (MYCOSTATIN) 100000 UNIT/ML suspension Take 5 mLs (500,000 Units total) by mouth 4 (four) times daily. 60 mL 0   No current facility-administered medications for this visit.    No Known Allergies  Social History   Socioeconomic History  . Marital status: Married    Spouse name: Not on file  . Number of children: Not on file  . Years of education: Not on file  . Highest education level: Not on file  Occupational History  . Not on file  Tobacco Use  . Smoking status: Never Smoker  . Smokeless tobacco: Never Used  Substance and Sexual Activity  . Alcohol use: No    Alcohol/week: 0.0 standard drinks  . Drug use: No  . Sexual activity: Yes  Birth control/protection: None  Other Topics Concern  . Not on file  Social History Narrative  . Not on file   Social Determinants of Health   Financial Resource Strain:   . Difficulty of Paying Living Expenses: Not on file  Food Insecurity:   . Worried About Charity fundraiser in the Last Year: Not on file  . Ran Out of Food in the Last Year: Not on file  Transportation Needs:   . Lack of Transportation (Medical): Not on file  . Lack of Transportation (Non-Medical): Not on file  Physical Activity:   . Days of Exercise per Week: Not on file  . Minutes of  Exercise per Session: Not on file  Stress:   . Feeling of Stress : Not on file  Social Connections:   . Frequency of Communication with Friends and Family: Not on file  . Frequency of Social Gatherings with Friends and Family: Not on file  . Attends Religious Services: Not on file  . Active Member of Clubs or Organizations: Not on file  . Attends Archivist Meetings: Not on file  . Marital Status: Not on file  Intimate Partner Violence:   . Fear of Current or Ex-Partner: Not on file  . Emotionally Abused: Not on file  . Physically Abused: Not on file  . Sexually Abused: Not on file    ROS Per hpi   Objective   Vitals as reported by the patient: There were no vitals filed for this visit.  Diagnoses and all orders for this visit:  Oral thrush -     nystatin (MYCOSTATIN) 100000 UNIT/ML suspension; Take 5 mLs (500,000 Units total) by mouth 4 (four) times daily.   PLAN  Nystatin rinse 4 times daily. Discussed to rinse for a significant duration of time to see effect from treatment.   Reviewed reasons to return to clinic  Patient encouraged to call clinic with any questions, comments, or concerns.   I discussed the assessment and treatment plan with the patient. The patient was provided an opportunity to ask questions and all were answered. The patient agreed with the plan and demonstrated an understanding of the instructions.   The patient was advised to call back or seek an in-person evaluation if the symptoms worsen or if the condition fails to improve as anticipated.  I provided 11 minutes of non-face-to-face time during this encounter.  Maximiano Coss, NP  Primary Care at [facility]

## 2019-08-13 ENCOUNTER — Other Ambulatory Visit: Payer: Self-pay

## 2019-08-13 ENCOUNTER — Encounter (HOSPITAL_COMMUNITY): Payer: Self-pay

## 2019-08-13 ENCOUNTER — Ambulatory Visit (HOSPITAL_COMMUNITY)
Admission: RE | Admit: 2019-08-13 | Discharge: 2019-08-13 | Disposition: A | Discharge status: Home / Self Care | Payer: 59 | Source: Ambulatory Visit | Attending: Obstetrics and Gynecology | Admitting: Obstetrics and Gynecology

## 2019-08-13 ENCOUNTER — Ambulatory Visit (HOSPITAL_COMMUNITY): Payer: 59 | Admitting: *Deleted

## 2019-08-13 ENCOUNTER — Other Ambulatory Visit (HOSPITAL_COMMUNITY): Payer: Self-pay | Admitting: *Deleted

## 2019-08-13 ENCOUNTER — Ambulatory Visit (HOSPITAL_COMMUNITY): Payer: 59

## 2019-08-13 VITALS — BP 123/72 | HR 116 | Temp 97.7°F

## 2019-08-13 DIAGNOSIS — O99112 Other diseases of the blood and blood-forming organs and certain disorders involving the immune mechanism complicating pregnancy, second trimester: Secondary | ICD-10-CM | POA: Insufficient documentation

## 2019-08-13 DIAGNOSIS — Z86718 Personal history of other venous thrombosis and embolism: Secondary | ICD-10-CM | POA: Diagnosis not present

## 2019-08-13 DIAGNOSIS — O09529 Supervision of elderly multigravida, unspecified trimester: Secondary | ICD-10-CM | POA: Insufficient documentation

## 2019-08-13 DIAGNOSIS — D6859 Other primary thrombophilia: Secondary | ICD-10-CM | POA: Diagnosis not present

## 2019-08-13 DIAGNOSIS — O34219 Maternal care for unspecified type scar from previous cesarean delivery: Secondary | ICD-10-CM | POA: Diagnosis not present

## 2019-08-13 DIAGNOSIS — Z3A19 19 weeks gestation of pregnancy: Secondary | ICD-10-CM | POA: Insufficient documentation

## 2019-08-13 DIAGNOSIS — Z86711 Personal history of pulmonary embolism: Secondary | ICD-10-CM | POA: Insufficient documentation

## 2019-08-13 DIAGNOSIS — O88212 Thromboembolism in pregnancy, second trimester: Secondary | ICD-10-CM | POA: Diagnosis not present

## 2019-08-13 DIAGNOSIS — O09522 Supervision of elderly multigravida, second trimester: Secondary | ICD-10-CM | POA: Diagnosis not present

## 2019-08-13 DIAGNOSIS — Z3689 Encounter for other specified antenatal screening: Secondary | ICD-10-CM

## 2019-08-14 NOTE — Consult Note (Signed)
MFM Note  This patient was seen for a detailed fetal anatomy scan due to advanced maternal age.  The patient has an Antithrombin III deficiency with two prior thromboembolic events.  She reports that she had a clot in her shoulder and had a DVT with a pulmonary embolus in the past.  Due to her Antithrombin III deficiency and multiple thromboembolic events, she is currently treated with a therapeutic dose of Lovenox 120 mg twice a day.  She reports that she is being followed by a hematologist for the management of anticoagulation in pregnancy.  Her last pregnancy was also complicated by cholestasis of pregnancy.  She required a cesarean delivery due to arrest of dilatation.  She denies any other significant past medical history and denies any problems in her current pregnancy.    The patient had a quad screen which indicated a low risk for trisomy 60 and trisomy 85.  The patient did not have a cell free DNA test as she was concerned that the test would give her inaccurate results as she is treated with anticoagulation.  She was informed that the fetal growth and amniotic fluid level were appropriate for her gestational age.   The views of the fetal anatomy were unable to be fully visualized today due to the fetal position. The patient was informed that anomalies may be missed due to technical limitations. If the fetus is in a suboptimal position or maternal habitus is increased, visualization of the fetus in the maternal uterus may be impaired.  The patient was advised that the Antithrombin III deficiency is the most thrombogenic of the thrombophilias.  Due to her Antithrombin III deficiency with multiple prior thromboembolic events, I would recommend that she continue therapeutic Lovenox for the duration of her pregnancy and for 6 weeks postpartum.  She should continue to have her anti-factor Xa levels monitored to ensure that the anti-factor Xa levels are in the therapeutic range (0.6-1.0 IU/mL).  In  regards to the management of her anticoagulation and delivery, she should continue therapeutic Lovenox up until the day before her scheduled delivery. As she is treated with a therapeutic dose of anticoagulation, she may be eligible to receive regional anesthesia if it has been 24 hours or more since she received the anticoagulation medication.  She should probably have an anesthesia consult prior to delivery.  Due to her significant thrombophilia, I would recommend weekly fetal testing to be started at around 32 weeks.  She should probably also continue to be followed with serial growth ultrasounds.  A follow-up exam was scheduled in 4 weeks to complete the views of the fetal anatomy.  She will consider an amniocentesis for definitive diagnosis of fetal aneuploidy if any abnormalities are noted during her future exam.  A total of 15 minutes was spent counseling and coordinating the care for this patient.  Greater than 50% of the time was spent in direct face-to-face contact.

## 2019-08-16 ENCOUNTER — Other Ambulatory Visit: Payer: Self-pay

## 2019-08-16 ENCOUNTER — Inpatient Hospital Stay: Payer: 59 | Attending: Hematology and Oncology

## 2019-08-16 DIAGNOSIS — D6859 Other primary thrombophilia: Secondary | ICD-10-CM | POA: Insufficient documentation

## 2019-08-16 LAB — HEPARIN ANTI-XA: Heparin LMW: 0.64 IU/mL

## 2019-08-17 ENCOUNTER — Other Ambulatory Visit: Payer: Self-pay | Admitting: *Deleted

## 2019-08-17 DIAGNOSIS — D6859 Other primary thrombophilia: Secondary | ICD-10-CM

## 2019-08-17 MED ORDER — ENOXAPARIN SODIUM 150 MG/ML ~~LOC~~ SOLN: 150.0000 mg | Freq: Two times a day (BID) | SUBCUTANEOUS | 0 refills | Status: DC

## 2019-08-17 NOTE — Progress Notes (Signed)
Per MD based on pt lab results from yesterday, Lovenox subcu needs to be increased to 150 mg BID.  Pt notified and verbalized understanding.

## 2019-08-22 ENCOUNTER — Encounter (HOSPITAL_COMMUNITY): Payer: Self-pay | Admitting: Obstetrics and Gynecology

## 2019-08-22 ENCOUNTER — Other Ambulatory Visit (HOSPITAL_COMMUNITY): Payer: Self-pay | Admitting: Obstetrics and Gynecology

## 2019-08-23 ENCOUNTER — Inpatient Hospital Stay: Payer: 59

## 2019-08-23 ENCOUNTER — Encounter: Payer: Self-pay | Admitting: *Deleted

## 2019-08-23 ENCOUNTER — Other Ambulatory Visit: Payer: Self-pay

## 2019-08-23 DIAGNOSIS — D6859 Other primary thrombophilia: Secondary | ICD-10-CM | POA: Diagnosis not present

## 2019-08-23 LAB — HEPARIN ANTI-XA: Heparin LMW: 0.77 IU/mL

## 2019-08-23 NOTE — Progress Notes (Signed)
Per MD based on pt heparin anti-xa results today, pt does of Lovenox to remain at 150 mg BID.  RN attempt x1 to contact pt to notify her.  No answer, LVM to return call to the office.

## 2019-09-03 ENCOUNTER — Telehealth: Payer: Self-pay

## 2019-09-03 ENCOUNTER — Encounter: Payer: Self-pay | Admitting: Emergency Medicine

## 2019-09-03 ENCOUNTER — Telehealth (INDEPENDENT_AMBULATORY_CARE_PROVIDER_SITE_OTHER): Payer: 59 | Admitting: Emergency Medicine

## 2019-09-03 ENCOUNTER — Other Ambulatory Visit: Payer: Self-pay

## 2019-09-03 DIAGNOSIS — B370 Candidal stomatitis: Secondary | ICD-10-CM | POA: Diagnosis not present

## 2019-09-03 NOTE — Patient Instructions (Signed)
° ° ° °  If you have lab work done today you will be contacted with your lab results within the next 2 weeks.  If you have not heard from us then please contact us. The fastest way to get your results is to register for My Chart. ° ° °IF you received an x-ray today, you will receive an invoice from [facility]. Please contact [facility] at [phone] with questions or concerns regarding your invoice.  ° °IF you received labwork today, you will receive an invoice from [facility]. Please contact [facility] at [phone] with questions or concerns regarding your invoice.  ° °Our billing staff will not be able to assist you with questions regarding bills from these companies. ° °You will be contacted with the lab results as soon as they are available. The fastest way to get your results is to activate your My Chart account. Instructions are located on the last page of this paperwork. If you have not heard from us regarding the results in 2 weeks, please contact this office. °  ° ° ° °

## 2019-09-03 NOTE — Progress Notes (Signed)
Sore throat possible oral thrush worse during the night. (going on for 2 weeks)   Feeling icky because she is sick.  Was seen in urgent care last tue 08/28/19

## 2019-09-03 NOTE — Progress Notes (Signed)
Telemedicine Encounter- SOAP NOTE Established Patient MyChart video conference. This video telephone encounter was conducted with the patient's (or proxy's) verbal consent via video audio telecommunications: yes/no: Yes Patient was instructed to have this encounter in a suitably private space; and to only have persons present to whom they give permission to participate. In addition, patient identity was confirmed by use of name plus two identifiers (DOB and address).  I discussed the limitations, risks, security and privacy concerns of performing an evaluation and management service by telephone and the availability of in person appointments. I also discussed with the patient that there may be a patient responsible charge related to this service. The patient expressed understanding and agreed to proceed.  I spent a total of TIME; 0 MIN TO 60 MIN: 15 minutes talking with the patient or their proxy.  Chief Complaint  Patient presents with  . Sore Throat    possible thrush going on for 2 weeks    Subjective   Sherry Ray is a 39 y.o. female established patient.  First ever visit with me.  Telephone visit today for possible oral thrush unresponsive to nystatin rinsing solution.  Took antibiotics last month for almost 2 weeks.  She is [redacted] weeks pregnant.  Saw ENT doctor last month and is scheduled to see them again on 09/10/2019.  Also went to urgent care clinic on 08/28/2019 for same.  States she spoke to her OB office nurse today and told it was okay to take Diflucan outside the first trimester.  Requesting to be started on Diflucan.  No other complaints or medical concerns today. Able to eat and drink okay.  No nausea or vomiting.  Denies fever or chills.  Able to swallow normal.  HPI   Patient Active Problem List   Diagnosis Date Noted  . Upper respiratory tract infection 07/05/2019  . Antithrombin deficiency ([place]) 07/14/2016  . Depression with anxiety 12/17/2015    Past Medical  History:  Diagnosis Date  . Antithrombin deficiency ([place])    had a PE (2008) and DVT (1996), took coumadin for 1 year  . Anxiety and depression   . Depression   . DVT (deep venous thrombosis) ([place])   . Headache   . History of cholestasis during pregnancy   . Pulmonary embolism ([place])   . Vaginal Pap smear, abnormal     Current Outpatient Medications  Medication Sig Dispense Refill  . aspirin EC 81 MG tablet Take 81 mg by mouth daily.    . cetirizine (ZYRTEC) 10 MG tablet Take 1 tablet by mouth daily.    . clotrimazole (MYCELEX) 10 MG troche Take 10 mg by mouth 3 (three) times daily.    Marland Kitchen enoxaparin (LOVENOX) 150 MG/ML injection Inject 1 mL (150 mg total) into the skin every 12 (twelve) hours. 60 mL 0  . Prenatal Vit-Fe Fumarate-FA (PRENATAL VITAMINS PO) Take by mouth.    Marland Kitchen azelastine (ASTELIN) 0.1 % nasal spray Place 2 sprays into both nostrils 2 (two) times daily. Use in each nostril as directed (Patient not taking: Reported on 07/24/2019) 30 mL 1  . Doxylamine Succinate, Sleep, (UNISOM PO) Take by mouth.    . nystatin (MYCOSTATIN) 100000 UNIT/ML suspension Take 5 mLs (500,000 Units total) by mouth 4 (four) times daily. (Patient not taking: Reported on 09/03/2019) 60 mL 0  . Pyridoxine HCl (VITAMIN B6 PO) Take by mouth.     No current facility-administered medications for this visit.    No Known Allergies  Social History   Socioeconomic History  . Marital status: Married    Spouse name: Not on file  . Number of children: Not on file  . Years of education: Not on file  . Highest education level: Not on file  Occupational History  . Not on file  Tobacco Use  . Smoking status: Never Smoker  . Smokeless tobacco: Never Used  Substance and Sexual Activity  . Alcohol use: No    Alcohol/week: 0.0 standard drinks  . Drug use: No  . Sexual activity: Yes    Birth control/protection: None  Other Topics Concern  . Not on file  Social History Narrative  . Not on file   Social  Determinants of Health   Financial Resource Strain:   . Difficulty of Paying Living Expenses:   Food Insecurity:   . Worried About Charity fundraiser in the Last Year:   . Arboriculturist in the Last Year:   Transportation Needs:   . Film/video editor (Medical):   Marland Kitchen Lack of Transportation (Non-Medical):   Physical Activity:   . Days of Exercise per Week:   . Minutes of Exercise per Session:   Stress:   . Feeling of Stress :   Social Connections:   . Frequency of Communication with Friends and Family:   . Frequency of Social Gatherings with Friends and Family:   . Attends Religious Services:   . Active Member of Clubs or Organizations:   . Attends Archivist Meetings:   Marland Kitchen Marital Status:   Intimate Partner Violence:   . Fear of Current or Ex-Partner:   . Emotionally Abused:   Marland Kitchen Physically Abused:   . Sexually Abused:     Review of Systems  Constitutional: Negative.  Negative for chills and fever.  HENT: Negative.        Tongue pain  Respiratory: Negative.  Negative for cough and shortness of breath.   Cardiovascular: Negative for chest pain and palpitations.  Gastrointestinal: Negative.  Negative for abdominal pain, diarrhea, nausea and vomiting.  Musculoskeletal: Negative.  Negative for myalgias and neck pain.  Skin: Negative.  Negative for rash.  Neurological: Negative.  Negative for dizziness and headaches.  All other systems reviewed and are negative.   Objective  Alert and oriented x3 in no apparent respiratory distress Mouth: Whitish-yellowish coating on her tongue. Vitals as reported by the patient: There were no vitals filed for this visit.  Sherry Ray was seen today for sore throat.  Diagnoses and all orders for this visit:  Oral thrush  Patient requesting Diflucan.  However Diflucan is contraindicated in pregnancy as confirmed by red warning label.  I do not feel comfortable prescribing this medication at this time without direct advise  from her OB doctor.  She also has an upcoming appointment with her ENT doctor and I prefer she sees them before making any other decisions.   I discussed the assessment and treatment plan with the patient. The patient was provided an opportunity to ask questions and all were answered. The patient agreed with the plan and demonstrated an understanding of the instructions.   The patient was advised to call back or seek an in-person evaluation if the symptoms worsen or if the condition fails to improve as anticipated.  I provided 15 minutes of non-face-to-face time during this encounter.  Horald Pollen, MD  Primary Care at [facility]

## 2019-09-11 ENCOUNTER — Ambulatory Visit (HOSPITAL_COMMUNITY): Payer: 59 | Admitting: *Deleted

## 2019-09-11 ENCOUNTER — Other Ambulatory Visit: Payer: Self-pay

## 2019-09-11 ENCOUNTER — Other Ambulatory Visit (HOSPITAL_COMMUNITY): Payer: Self-pay | Admitting: *Deleted

## 2019-09-11 ENCOUNTER — Ambulatory Visit (HOSPITAL_COMMUNITY)
Admission: RE | Admit: 2019-09-11 | Discharge: 2019-09-11 | Disposition: A | Discharge status: Home / Self Care | Payer: 59 | Source: Ambulatory Visit | Attending: Obstetrics and Gynecology | Admitting: Obstetrics and Gynecology

## 2019-09-11 ENCOUNTER — Encounter (HOSPITAL_COMMUNITY): Payer: Self-pay

## 2019-09-11 VITALS — BP 126/68 | HR 107 | Temp 97.7°F

## 2019-09-11 DIAGNOSIS — O2690 Pregnancy related conditions, unspecified, unspecified trimester: Secondary | ICD-10-CM | POA: Diagnosis not present

## 2019-09-11 DIAGNOSIS — Z362 Encounter for other antenatal screening follow-up: Secondary | ICD-10-CM | POA: Diagnosis not present

## 2019-09-11 DIAGNOSIS — Z3A23 23 weeks gestation of pregnancy: Secondary | ICD-10-CM | POA: Diagnosis not present

## 2019-09-11 DIAGNOSIS — O0990 Supervision of high risk pregnancy, unspecified, unspecified trimester: Secondary | ICD-10-CM

## 2019-09-11 DIAGNOSIS — O34219 Maternal care for unspecified type scar from previous cesarean delivery: Secondary | ICD-10-CM

## 2019-09-11 DIAGNOSIS — O09892 Supervision of other high risk pregnancies, second trimester: Secondary | ICD-10-CM

## 2019-09-11 DIAGNOSIS — O09529 Supervision of elderly multigravida, unspecified trimester: Secondary | ICD-10-CM | POA: Insufficient documentation

## 2019-09-11 DIAGNOSIS — D689 Coagulation defect, unspecified: Secondary | ICD-10-CM

## 2019-09-11 DIAGNOSIS — O09522 Supervision of elderly multigravida, second trimester: Secondary | ICD-10-CM | POA: Diagnosis not present

## 2019-09-11 DIAGNOSIS — O3440 Maternal care for other abnormalities of cervix, unspecified trimester: Secondary | ICD-10-CM

## 2019-09-11 DIAGNOSIS — O09299 Supervision of pregnancy with other poor reproductive or obstetric history, unspecified trimester: Secondary | ICD-10-CM

## 2019-09-17 ENCOUNTER — Other Ambulatory Visit: Payer: Self-pay

## 2019-09-17 ENCOUNTER — Inpatient Hospital Stay: Payer: 59 | Attending: Hematology and Oncology

## 2019-09-17 ENCOUNTER — Telehealth: Payer: Self-pay | Admitting: Hematology and Oncology

## 2019-09-17 DIAGNOSIS — D6859 Other primary thrombophilia: Secondary | ICD-10-CM | POA: Diagnosis not present

## 2019-09-17 LAB — HEPARIN ANTI-XA: Heparin LMW: 0.75 IU/mL

## 2019-09-17 NOTE — Telephone Encounter (Signed)
I informed the patient that the antiten a level was 0.75 and it is within the therapeutic range for the twice daily Lovenox dosing.  We will repeat this monthly.  She will continue the same dose.

## 2019-09-18 ENCOUNTER — Other Ambulatory Visit: Payer: Self-pay | Admitting: *Deleted

## 2019-09-18 MED ORDER — ENOXAPARIN SODIUM 150 MG/ML ~~LOC~~ SOLN: 150.0000 mg | Freq: Two times a day (BID) | SUBCUTANEOUS | 0 refills | Status: DC

## 2019-10-13 ENCOUNTER — Other Ambulatory Visit: Payer: Self-pay | Admitting: Hematology and Oncology

## 2019-10-17 ENCOUNTER — Other Ambulatory Visit: Payer: Self-pay

## 2019-10-17 ENCOUNTER — Inpatient Hospital Stay: Payer: 59 | Attending: Hematology and Oncology

## 2019-10-17 DIAGNOSIS — D6859 Other primary thrombophilia: Secondary | ICD-10-CM | POA: Diagnosis present

## 2019-10-17 LAB — HEPARIN ANTI-XA: Heparin LMW: 0.7 IU/mL

## 2019-10-18 ENCOUNTER — Other Ambulatory Visit: Payer: Self-pay

## 2019-10-18 ENCOUNTER — Telehealth: Payer: Self-pay

## 2019-10-18 MED ORDER — ENOXAPARIN SODIUM 150 MG/ML ~~LOC~~ SOLN: SUBCUTANEOUS | 0 refills | Status: DC

## 2019-10-18 NOTE — Telephone Encounter (Signed)
MD reviewed labs, continue with current Lovenox dose.  Recheck in 1 month.  Pt notified, verbalized agreement and understanding.   Refill sent, no further needs.

## 2019-10-22 NOTE — Telephone Encounter (Signed)
No action needed, closing encounter from inbox

## 2019-11-06 ENCOUNTER — Other Ambulatory Visit: Payer: Self-pay | Admitting: *Deleted

## 2019-11-06 ENCOUNTER — Other Ambulatory Visit: Payer: Self-pay

## 2019-11-06 ENCOUNTER — Ambulatory Visit (HOSPITAL_COMMUNITY): Payer: 59 | Attending: Obstetrics and Gynecology

## 2019-11-06 ENCOUNTER — Ambulatory Visit: Payer: 59 | Admitting: *Deleted

## 2019-11-06 VITALS — BP 122/73 | HR 112

## 2019-11-06 DIAGNOSIS — O2693 Pregnancy related conditions, unspecified, third trimester: Secondary | ICD-10-CM

## 2019-11-06 DIAGNOSIS — O34219 Maternal care for unspecified type scar from previous cesarean delivery: Secondary | ICD-10-CM

## 2019-11-06 DIAGNOSIS — O09523 Supervision of elderly multigravida, third trimester: Secondary | ICD-10-CM | POA: Diagnosis not present

## 2019-11-06 DIAGNOSIS — O09893 Supervision of other high risk pregnancies, third trimester: Secondary | ICD-10-CM | POA: Diagnosis not present

## 2019-11-06 DIAGNOSIS — O99119 Other diseases of the blood and blood-forming organs and certain disorders involving the immune mechanism complicating pregnancy, unspecified trimester: Secondary | ICD-10-CM

## 2019-11-06 DIAGNOSIS — O09293 Supervision of pregnancy with other poor reproductive or obstetric history, third trimester: Secondary | ICD-10-CM

## 2019-11-06 DIAGNOSIS — O0990 Supervision of high risk pregnancy, unspecified, unspecified trimester: Secondary | ICD-10-CM | POA: Diagnosis present

## 2019-11-06 DIAGNOSIS — Z362 Encounter for other antenatal screening follow-up: Secondary | ICD-10-CM | POA: Diagnosis not present

## 2019-11-06 DIAGNOSIS — D689 Coagulation defect, unspecified: Secondary | ICD-10-CM

## 2019-11-06 DIAGNOSIS — D6859 Other primary thrombophilia: Secondary | ICD-10-CM

## 2019-11-06 DIAGNOSIS — Z3A31 31 weeks gestation of pregnancy: Secondary | ICD-10-CM

## 2019-11-06 DIAGNOSIS — O3443 Maternal care for other abnormalities of cervix, third trimester: Secondary | ICD-10-CM

## 2019-11-14 ENCOUNTER — Telehealth: Payer: Self-pay | Admitting: Hematology and Oncology

## 2019-11-14 NOTE — Telephone Encounter (Signed)
Scheduled per 6/2 sch message. Pt requested appts a week earlier due to pt's delivery date.

## 2019-11-16 ENCOUNTER — Other Ambulatory Visit: Payer: Self-pay

## 2019-11-16 ENCOUNTER — Inpatient Hospital Stay: Payer: 59 | Attending: Hematology and Oncology

## 2019-11-16 DIAGNOSIS — Z86711 Personal history of pulmonary embolism: Secondary | ICD-10-CM | POA: Insufficient documentation

## 2019-11-16 DIAGNOSIS — D6859 Other primary thrombophilia: Secondary | ICD-10-CM | POA: Insufficient documentation

## 2019-11-16 LAB — HEPARIN ANTI-XA: Heparin LMW: 0.81 IU/mL

## 2019-11-19 ENCOUNTER — Other Ambulatory Visit: Payer: Self-pay | Admitting: *Deleted

## 2019-11-19 MED ORDER — ENOXAPARIN SODIUM 150 MG/ML ~~LOC~~ SOLN: SUBCUTANEOUS | 0 refills | Status: DC

## 2019-11-30 ENCOUNTER — Other Ambulatory Visit: Payer: Self-pay | Admitting: Hematology and Oncology

## 2019-12-04 ENCOUNTER — Ambulatory Visit: Payer: 59 | Attending: Obstetrics

## 2019-12-04 ENCOUNTER — Encounter: Payer: Self-pay | Admitting: *Deleted

## 2019-12-04 ENCOUNTER — Other Ambulatory Visit: Payer: Self-pay | Admitting: Obstetrics

## 2019-12-04 ENCOUNTER — Ambulatory Visit: Payer: 59 | Admitting: *Deleted

## 2019-12-04 ENCOUNTER — Other Ambulatory Visit: Payer: Self-pay

## 2019-12-04 VITALS — BP 128/73 | HR 107

## 2019-12-04 DIAGNOSIS — Z3A35 35 weeks gestation of pregnancy: Secondary | ICD-10-CM

## 2019-12-04 DIAGNOSIS — O09893 Supervision of other high risk pregnancies, third trimester: Secondary | ICD-10-CM | POA: Diagnosis not present

## 2019-12-04 DIAGNOSIS — D689 Coagulation defect, unspecified: Secondary | ICD-10-CM

## 2019-12-04 DIAGNOSIS — O403XX0 Polyhydramnios, third trimester, not applicable or unspecified: Secondary | ICD-10-CM

## 2019-12-04 DIAGNOSIS — Z8719 Personal history of other diseases of the digestive system: Secondary | ICD-10-CM

## 2019-12-04 DIAGNOSIS — O09523 Supervision of elderly multigravida, third trimester: Secondary | ICD-10-CM

## 2019-12-04 DIAGNOSIS — O34219 Maternal care for unspecified type scar from previous cesarean delivery: Secondary | ICD-10-CM | POA: Diagnosis not present

## 2019-12-04 DIAGNOSIS — O99113 Other diseases of the blood and blood-forming organs and certain disorders involving the immune mechanism complicating pregnancy, third trimester: Secondary | ICD-10-CM | POA: Insufficient documentation

## 2019-12-04 DIAGNOSIS — Z362 Encounter for other antenatal screening follow-up: Secondary | ICD-10-CM

## 2019-12-04 DIAGNOSIS — Z86718 Personal history of other venous thrombosis and embolism: Secondary | ICD-10-CM

## 2019-12-09 NOTE — Progress Notes (Signed)
Patient Care Team: Patient, No Pcp Per as PCP - General (General Practice)  DIAGNOSIS:    ICD-10-CM   1. Antithrombin deficiency ([name])  D68.59     CHIEF COMPLIANT: Follow-up of antithrombin deficiency  INTERVAL HISTORY: Sherry Ray is a 39 y.o. with above-mentioned history of antithrombin deficiency who is currently pregnant and on treatment with Lovenox. She presents to the clinic today for follow-up to discuss the plan for peripartum care.   ALLERGIES:  has No Known Allergies.  MEDICATIONS:  Current Outpatient Medications  Medication Sig Dispense Refill   aspirin EC 81 MG tablet Take 81 mg by mouth daily.     azelastine (ASTELIN) 0.1 % nasal spray Place 2 sprays into both nostrils 2 (two) times daily. Use in each nostril as directed (Patient not taking: Reported on 07/24/2019) 30 mL 1   cetirizine (ZYRTEC) 10 MG tablet Take 1 tablet by mouth daily.     clotrimazole (MYCELEX) 10 MG troche Take 10 mg by mouth 3 (three) times daily.     Doxylamine Succinate, Sleep, (UNISOM PO) Take by mouth.     enoxaparin (LOVENOX) 150 MG/ML injection INJECT 1 ML (150 MG TOTAL) UNDER THE SKIN EVERY 12 HOURS 60 mL 0   Famotidine (PEPCID PO) Take by mouth.     nystatin (MYCOSTATIN) 100000 UNIT/ML suspension Take 5 mLs (500,000 Units total) by mouth 4 (four) times daily. (Patient not taking: Reported on 09/03/2019) 60 mL 0   Prenatal Vit-Fe Fumarate-FA (PRENATAL VITAMINS PO) Take by mouth.     Pyridoxine HCl (VITAMIN B6 PO) Take by mouth.     No current facility-administered medications for this visit.    PHYSICAL EXAMINATION: ECOG PERFORMANCE STATUS: 1 - Symptomatic but completely ambulatory  Vitals:   12/10/19 1429  BP: (!) 142/74  Pulse: 99  Resp: 20  Temp: 98.9 F (37.2 C)  SpO2: 99%   Filed Weights   12/10/19 1429  Weight: 179 lb 11.2 oz (81.5 kg)    LABORATORY DATA:  I have reviewed the data as listed CMP Latest Ref Rng & Units 06/06/2018 02/04/2018 04/28/2017    Glucose 65 - 99 mg/dL 100(H) 100(H) 65  BUN 6 - 20 mg/dL 20 17 12   Creatinine 0.57 - 1.00 mg/dL 0.68 0.80 0.74  Sodium 134 - 144 mmol/L 137 141 141  Potassium 3.5 - 5.2 mmol/L 4.4 3.9 4.2  Chloride 96 - 106 mmol/L 101 105 101  CO2 20 - 29 mmol/L 22 26 23   Calcium 8.7 - 10.2 mg/dL 9.7 9.5 9.0  Total Protein 6.0 - 8.5 g/dL 7.1 7.2 7.2  Total Bilirubin 0.0 - 1.2 mg/dL 0.2 0.4 <0.2  Alkaline Phos 39 - 117 IU/L 123(H) 135(H) 82  AST 0 - 40 IU/L 24 42(H) 22  ALT 0 - 32 IU/L 40(H) 45(H) 26    Lab Results  Component Value Date   WBC 8.3 06/06/2018   HGB 12.8 06/06/2018   HCT 39.9 06/06/2018   MCV 80 06/06/2018   PLT 330 06/06/2018   NEUTROABS 4.3 06/06/2018    ASSESSMENT & PLAN:  Antithrombin deficiency ([place]) 02/13/2007 bilateral pulmonary emboli bilateral lower lobes and right middle lobe, right lower lobe pulmonary infarct 3 cm exophytic lesion right scapula with aggressive features question chondrosarcoma  Hypercoagulable workup in 2008 reviewed mild Antithrombin deficiency of 45% Since then patient had Antithrombin levels checked and they had been between 40-50%.  Patient delivered July 2020 baby boy at [facility] by C-section. She received Antithrombin infusions 3  doses postpartum.  Current pregnancy: Expected date for cesarean section is 12/31/2019 at 12 noon Patient is concerned about delivery before that date. We will send a prescription for heparin 7500 units subcu twice daily to her specialty pharmacy [facility]   Current treatment: Lovenox injections 100 mcg subcu daily being prescribed by physicians program and OB/GYN  When it comes time to delivery she may require Thrombate Goal of Antithrombin III at the time of delivery: 80 to 120% Dosing:Target % - trough % x body weight (kg) divided by 1.4% UNITS  Monitoring during Thrombate infusion:  1. Pre-injection (trough) 2. 20 minutes (peak) post-injection 3. 12 hours post-injection 4. 24 hr post  Dose given  over 10 to 20 minutes  No orders of the defined types were placed in this encounter.  The patient has a good understanding of the overall plan. she agrees with it. she will call with any problems that may develop before the next visit here.  Total time spent: 20 mins including face to face time and time spent for planning, charting and coordination of care  Nicholas Lose, MD 12/10/2019  I, Cloyde Reams Dorshimer, am acting as scribe for Dr. Nicholas Lose.  I have reviewed the above documentation for accuracy and completeness, and I agree with the above.

## 2019-12-10 ENCOUNTER — Other Ambulatory Visit: Payer: Self-pay

## 2019-12-10 ENCOUNTER — Inpatient Hospital Stay: Payer: 59

## 2019-12-10 ENCOUNTER — Inpatient Hospital Stay (HOSPITAL_BASED_OUTPATIENT_CLINIC_OR_DEPARTMENT_OTHER): Payer: 59 | Admitting: Hematology and Oncology

## 2019-12-10 DIAGNOSIS — D6859 Other primary thrombophilia: Secondary | ICD-10-CM | POA: Diagnosis not present

## 2019-12-10 LAB — HEPARIN ANTI-XA: Heparin LMW: 0.9 IU/mL

## 2019-12-10 MED ORDER — HEPARIN SODIUM (PORCINE) 10000 UNIT/ML IJ SOLN: 7500.0000 [IU] | Freq: Two times a day (BID) | INTRAMUSCULAR | 0 refills | Status: DC

## 2019-12-10 NOTE — Assessment & Plan Note (Signed)
02/13/2007 bilateral pulmonary emboli bilateral lower lobes and right middle lobe, right lower lobe pulmonary infarct 3 cm exophytic lesion right scapula with aggressive features question chondrosarcoma  Hypercoagulable workup in 2008 reviewed mild Antithrombin deficiency of 45% Since then patient had Antithrombin levels checked and they had been between 40-50%.  Patient delivered July 2020 baby boy at [facility] by C-section. She received Antithrombin infusions 3 doses postpartum.  Current pregnancy:   Current treatment: Lovenox injections 100 mcg subcu daily being prescribed by physicians program and OB/GYN  When it comes time to delivery she may require either plasma or Thrombate Goal of Antithrombin III at the time of delivery: 80 to 120% Dosing:Target % - trough % x body weight (kg) divided by 1.4% UNITS  Monitoring during Thrombate infusion:  1. 20 minutes (peak) post-injection 2. at least every 12 hours post-injection 3. Pre-injection (trough) Doses given over 10 to 20 minutes

## 2019-12-11 ENCOUNTER — Other Ambulatory Visit: Payer: Self-pay | Admitting: *Deleted

## 2019-12-11 ENCOUNTER — Other Ambulatory Visit: Payer: Self-pay | Admitting: Hematology and Oncology

## 2019-12-11 DIAGNOSIS — D6859 Other primary thrombophilia: Secondary | ICD-10-CM

## 2019-12-11 MED ORDER — HEPARIN SODIUM (PORCINE) 10000 UNIT/ML IJ SOLN: 7500.0000 [IU] | Freq: Two times a day (BID) | INTRAMUSCULAR | 0 refills | Status: DC

## 2019-12-11 MED ORDER — "SYRINGE/NEEDLE (DISP) 25G X 5/8"" 3 ML MISC": 0 refills | Status: DC

## 2019-12-11 NOTE — Addendum Note (Signed)
Addended by: Delane Ginger on: 12/11/2019 03:39 PM   Modules accepted: Orders

## 2019-12-12 ENCOUNTER — Other Ambulatory Visit: Payer: Self-pay

## 2019-12-12 ENCOUNTER — Telehealth: Payer: Self-pay | Admitting: Hematology and Oncology

## 2019-12-12 ENCOUNTER — Inpatient Hospital Stay: Payer: 59

## 2019-12-12 DIAGNOSIS — D6859 Other primary thrombophilia: Secondary | ICD-10-CM

## 2019-12-12 LAB — ANTITHROMBIN III: AntiThromb III Func: 51 % — ABNORMAL LOW (ref 75–120)

## 2019-12-12 LAB — HEPARIN ANTI-XA: Heparin LMW: 0.62 IU/mL

## 2019-12-12 NOTE — Telephone Encounter (Signed)
No 6/28 los, no changes made to pt schedule

## 2019-12-17 ENCOUNTER — Telehealth (HOSPITAL_COMMUNITY): Payer: Self-pay | Admitting: *Deleted

## 2019-12-17 NOTE — Patient Instructions (Addendum)
Sherry Ray  12/17/2019   Your procedure is scheduled on:  12/31/2019  Arrive at 0800 at Entrance C on [address] at [facility]  and [address]. You are invited to use the FREE valet parking or use the Visitor's parking deck.  Pick up the phone at the desk and dial [phone].  Call this number if you have problems the morning of surgery: [phone]  Remember:   Do not eat food:(After Midnight) Desps de medianoche.  Do not drink clear liquids: (After Midnight) Desps de medianoche.  Take these medicines the morning of surgery with A SIP OF WATER:  Do not take any medications the morning of surgery.     Do not wear jewelry, make-up or nail polish.  Do not wear lotions, powders, or perfumes. Do not wear deodorant.  Do not shave 48 hours prior to surgery.  Do not bring valuables to the hospital.  [facility] is not   responsible for any belongings or valuables brought to the hospital.  Contacts, dentures or bridgework may not be worn into surgery.  Leave suitcase in the car. After surgery it may be brought to your room.  For patients admitted to the hospital, checkout time is 11:00 AM the day of              discharge.      Please read over the following fact sheets that you were given:     Preparing for Surgery

## 2019-12-17 NOTE — Telephone Encounter (Signed)
Preadmission screen  

## 2019-12-18 ENCOUNTER — Other Ambulatory Visit: Payer: 59

## 2019-12-18 ENCOUNTER — Other Ambulatory Visit: Payer: Self-pay | Admitting: *Deleted

## 2019-12-18 ENCOUNTER — Ambulatory Visit: Payer: 59 | Admitting: Hematology and Oncology

## 2019-12-18 MED ORDER — "SYRINGE/NEEDLE (DISP) 25G X 5/8"" 3 ML MISC": 0 refills | Status: DC

## 2019-12-18 MED FILL — BD TB SYRINGE 27GX1/2: 27G X 1/2" | 30 days supply | Qty: 60 | Fill #0

## 2019-12-19 ENCOUNTER — Encounter (HOSPITAL_COMMUNITY): Payer: Self-pay

## 2019-12-21 ENCOUNTER — Encounter: Payer: Self-pay | Admitting: *Deleted

## 2019-12-21 NOTE — Progress Notes (Signed)
Per MD okay for pt to begin 7,500 unit of heparin subcu q. 12 hours today. Pt stopped by the office and RN educated pt on heparin administration.  RN also showed pt how to draw up heparin from the vile in the syringes provided by her pharmacy to administer her fist dose while she was in the office.  Pt successfully drew up the correct dosage of heparin and administered it successfully subcutaneously.  Pt verbalized understanding of correct technique and states she will call the office with any concerns.

## 2019-12-25 NOTE — H&P (Signed)
Sherry Ray is a 39 y.o. female presenting for repeat C/S. 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 Has recently been anticoagulated with heparin. Also depression on sertraline, migraine HA with aura, Hx of PE, AMA with normal first trimester screen and Hx of cesarean section. OB History    Gravida  2   Para  1   Term  1   Preterm      AB      Living  1     SAB      TAB      Ectopic      Multiple      Live Births             Past Medical History:  Diagnosis Date  . Antithrombin deficiency ([place])    had a PE (2008) and DVT (1996), took coumadin for 1 year  . Anxiety and depression   . Depression   . DVT (deep venous thrombosis) ([place])   . Headache   . History of cholestasis during pregnancy   . Pulmonary embolism ([place])   . Vaginal Pap smear, abnormal    Past Surgical History:  Procedure Laterality Date  . CESAREAN SECTION    . ESOPHAGOGASTRODUODENOSCOPY ENDOSCOPY    . FIRST RIB REMOVAL    . LEEP    . NASAL SINUS SURGERY     Family History: family history is not on file. Social History:  reports that she has never smoked. She has never used smokeless tobacco. She reports that she does not drink alcohol and does not use drugs.     Maternal Diabetes: No Genetic Screening: Normal Maternal Ultrasounds/Referrals: Normal Fetal Ultrasounds or other Referrals:  None Maternal Substance Abuse:  No Significant Maternal Medications:  Meds include: Other: heparin, sertraline Significant Maternal Lab Results:  Group B Strep negative Other Comments:  None  Review of Systems  Constitutional: Negative for fever.  Gastrointestinal: Negative for abdominal pain.  Neurological: Negative for headaches.   Maternal Medical History:  Fetal activity: Perceived fetal activity is normal.        Last menstrual period 03/30/2019, unknown if currently breastfeeding. Maternal Exam:  Abdomen: Fetal presentation: vertex     Physical Exam Cardiovascular:     Rate  and Rhythm: Normal rate.  Pulmonary:     Effort: Pulmonary effort is normal.  Abdominal:     Tenderness: There is no abdominal tenderness.     Prenatal labs: ABO, Rh:   Antibody:   Rubella:   RPR:    HBsAg:    HIV:    GBS:   negative 12/05/19  Assessment/Plan: 39 yo G2P1 for repeat cesarean section. Risks reviewed including infection, organ damage, bleeding/transfusion-HIV/Hep, DVT/PE, pneumonia, return to OR. AT III deficiency managed by DR Lindi Adie, heme. Heparin stopped yesterday. Dr Lindi Adie is managing AT III dosing pre and post op.   Shon Millet II 12/25/2019, 12:20 PM

## 2019-12-28 ENCOUNTER — Telehealth (HOSPITAL_COMMUNITY): Payer: Self-pay | Admitting: *Deleted

## 2019-12-28 NOTE — Telephone Encounter (Signed)
Per email from Dr Lindi Adie pt may arrive at 0900 for surgery.  Message relayed to patient.

## 2019-12-28 NOTE — Telephone Encounter (Signed)
Arrival changed to 0800 on day of surgery per phone call from Dr Lindi Adie RN.  States Thrombate needs to infuse 3 hours before surgery.  Patient notified verbalized understanding and instruction sheet changed to reflect arrival time.

## 2019-12-29 ENCOUNTER — Other Ambulatory Visit (HOSPITAL_COMMUNITY)
Admission: RE | Admit: 2019-12-29 | Discharge: 2019-12-29 | Disposition: A | Discharge status: Home / Self Care | Payer: 59 | Source: Ambulatory Visit | Attending: Obstetrics and Gynecology | Admitting: Obstetrics and Gynecology

## 2019-12-29 ENCOUNTER — Other Ambulatory Visit: Payer: Self-pay

## 2019-12-29 DIAGNOSIS — Z01812 Encounter for preprocedural laboratory examination: Secondary | ICD-10-CM | POA: Insufficient documentation

## 2019-12-29 DIAGNOSIS — Z20822 Contact with and (suspected) exposure to covid-19: Secondary | ICD-10-CM | POA: Insufficient documentation

## 2019-12-29 LAB — CBC
HCT: 39.7 % (ref 36.0–46.0)
Hemoglobin: 12.7 g/dL (ref 12.0–15.0)
MCH: 26.1 pg (ref 26.0–34.0)
MCHC: 32 g/dL (ref 30.0–36.0)
MCV: 81.7 fL (ref 80.0–100.0)
Platelets: 241 10*3/uL (ref 150–400)
RBC: 4.86 MIL/uL (ref 3.87–5.11)
RDW: 19 % — ABNORMAL HIGH (ref 11.5–15.5)
WBC: 13.4 10*3/uL — ABNORMAL HIGH (ref 4.0–10.5)
nRBC: 0 % (ref 0.0–0.2)

## 2019-12-29 LAB — TYPE AND SCREEN
ABO/RH(D): O POS
Antibody Screen: NEGATIVE

## 2019-12-29 LAB — RPR: RPR Ser Ql: NONREACTIVE

## 2019-12-29 LAB — SARS CORONAVIRUS 2 (TAT 6-24 HRS): SARS Coronavirus 2: NEGATIVE

## 2019-12-29 NOTE — MAU Note (Signed)
Pt here for labs and covid swab. Denies symptoms or sick contacts. Swab collected.

## 2019-12-31 ENCOUNTER — Encounter (HOSPITAL_COMMUNITY): Payer: Self-pay | Admitting: Obstetrics and Gynecology

## 2019-12-31 ENCOUNTER — Other Ambulatory Visit: Payer: Self-pay | Admitting: Hematology and Oncology

## 2019-12-31 ENCOUNTER — Inpatient Hospital Stay (HOSPITAL_COMMUNITY)
Admission: RE | Admit: 2019-12-31 | Discharge: 2020-01-02 | DRG: 787 | Disposition: A | Discharge status: Home / Self Care | Payer: 59 | Attending: Obstetrics and Gynecology | Admitting: Obstetrics and Gynecology

## 2019-12-31 ENCOUNTER — Inpatient Hospital Stay (HOSPITAL_COMMUNITY): Payer: 59 | Admitting: Anesthesiology

## 2019-12-31 ENCOUNTER — Encounter (HOSPITAL_COMMUNITY)
Admission: RE | Disposition: A | Discharge status: Home / Self Care | Payer: Self-pay | Source: Home / Self Care | Attending: Obstetrics and Gynecology

## 2019-12-31 ENCOUNTER — Other Ambulatory Visit: Payer: Self-pay

## 2019-12-31 DIAGNOSIS — Z3490 Encounter for supervision of normal pregnancy, unspecified, unspecified trimester: Secondary | ICD-10-CM

## 2019-12-31 DIAGNOSIS — Z86718 Personal history of other venous thrombosis and embolism: Secondary | ICD-10-CM

## 2019-12-31 DIAGNOSIS — Z3A39 39 weeks gestation of pregnancy: Secondary | ICD-10-CM | POA: Diagnosis not present

## 2019-12-31 DIAGNOSIS — Z86711 Personal history of pulmonary embolism: Secondary | ICD-10-CM

## 2019-12-31 DIAGNOSIS — Z20822 Contact with and (suspected) exposure to covid-19: Secondary | ICD-10-CM | POA: Diagnosis present

## 2019-12-31 DIAGNOSIS — D6859 Other primary thrombophilia: Secondary | ICD-10-CM | POA: Diagnosis present

## 2019-12-31 DIAGNOSIS — Z98891 History of uterine scar from previous surgery: Secondary | ICD-10-CM

## 2019-12-31 DIAGNOSIS — O9912 Other diseases of the blood and blood-forming organs and certain disorders involving the immune mechanism complicating childbirth: Secondary | ICD-10-CM | POA: Diagnosis present

## 2019-12-31 DIAGNOSIS — Z7901 Long term (current) use of anticoagulants: Secondary | ICD-10-CM | POA: Diagnosis not present

## 2019-12-31 DIAGNOSIS — F329 Major depressive disorder, single episode, unspecified: Secondary | ICD-10-CM | POA: Diagnosis present

## 2019-12-31 DIAGNOSIS — O99344 Other mental disorders complicating childbirth: Secondary | ICD-10-CM | POA: Diagnosis present

## 2019-12-31 DIAGNOSIS — F419 Anxiety disorder, unspecified: Secondary | ICD-10-CM | POA: Diagnosis present

## 2019-12-31 DIAGNOSIS — O34211 Maternal care for low transverse scar from previous cesarean delivery: Secondary | ICD-10-CM | POA: Diagnosis present

## 2019-12-31 LAB — ANTITHROMBIN III: AntiThromb III Func: 100 % (ref 75–120)

## 2019-12-31 LAB — ABO/RH: ABO/RH(D): O POS

## 2019-12-31 SURGERY — Surgical Case
Anesthesia: Spinal | Wound class: Clean Contaminated

## 2019-12-31 MED ORDER — PRENATAL MULTIVITAMIN CH
1.0000 | ORAL_TABLET | Freq: Every day | ORAL | Status: DC
  Administered 2020-01-01 – 2020-01-02 (×2): 1 via ORAL
  Filled 2019-12-31 (×2): qty 1

## 2019-12-31 MED ORDER — OXYTOCIN-SODIUM CHLORIDE 30-0.9 UT/500ML-% IV SOLN
INTRAVENOUS | Status: DC | PRN
  Administered 2019-12-31: 40 [IU] via INTRAVENOUS

## 2019-12-31 MED ORDER — DIPHENHYDRAMINE HCL 50 MG/ML IJ SOLN
12.5000 mg | INTRAMUSCULAR | Status: DC | PRN
  Administered 2019-12-31: 12.5 mg via INTRAVENOUS

## 2019-12-31 MED ORDER — NALBUPHINE HCL 10 MG/ML IJ SOLN: 5.0000 mg | INTRAMUSCULAR | Status: DC | PRN

## 2019-12-31 MED ORDER — TETANUS-DIPHTH-ACELL PERTUSSIS 5-2.5-18.5 LF-MCG/0.5 IM SUSP: 0.5000 mL | Freq: Once | INTRAMUSCULAR | Status: DC

## 2019-12-31 MED ORDER — OXYTOCIN-SODIUM CHLORIDE 30-0.9 UT/500ML-% IV SOLN
INTRAVENOUS | Status: AC
  Filled 2019-12-31: qty 500

## 2019-12-31 MED ORDER — MENTHOL 3 MG MT LOZG: 1.0000 | LOZENGE | OROMUCOSAL | Status: DC | PRN

## 2019-12-31 MED ORDER — OXYCODONE HCL 5 MG/5ML PO SOLN: 5.0000 mg | Freq: Once | ORAL | Status: DC | PRN

## 2019-12-31 MED ORDER — DIPHENHYDRAMINE HCL 50 MG/ML IJ SOLN
INTRAMUSCULAR | Status: AC
  Filled 2019-12-31: qty 1

## 2019-12-31 MED ORDER — COCONUT OIL OIL: 1.0000 "application " | TOPICAL_OIL | Status: DC | PRN

## 2019-12-31 MED ORDER — CEFAZOLIN SODIUM-DEXTROSE 2-4 GM/100ML-% IV SOLN
2.0000 g | INTRAVENOUS | Status: AC
  Administered 2019-12-31: 2 g via INTRAVENOUS

## 2019-12-31 MED ORDER — STERILE WATER FOR IRRIGATION IR SOLN
Status: DC | PRN
  Administered 2019-12-31: 1000 mL

## 2019-12-31 MED ORDER — LACTATED RINGERS IV SOLN: INTRAVENOUS | Status: DC

## 2019-12-31 MED ORDER — KETOROLAC TROMETHAMINE 30 MG/ML IJ SOLN
30.0000 mg | Freq: Once | INTRAMUSCULAR | Status: AC | PRN
  Administered 2019-12-31: 30 mg via INTRAVENOUS

## 2019-12-31 MED ORDER — OXYCODONE HCL 5 MG PO TABS: 5.0000 mg | ORAL_TABLET | Freq: Once | ORAL | Status: DC | PRN

## 2019-12-31 MED ORDER — SIMETHICONE 80 MG PO CHEW: 80.0000 mg | CHEWABLE_TABLET | ORAL | Status: DC | PRN

## 2019-12-31 MED ORDER — FENTANYL CITRATE (PF) 100 MCG/2ML IJ SOLN
INTRAMUSCULAR | Status: AC
  Filled 2019-12-31: qty 2

## 2019-12-31 MED ORDER — DIBUCAINE (PERIANAL) 1 % EX OINT: 1.0000 "application " | TOPICAL_OINTMENT | CUTANEOUS | Status: DC | PRN

## 2019-12-31 MED ORDER — ACETAMINOPHEN 325 MG PO TABS
650.0000 mg | ORAL_TABLET | ORAL | Status: DC | PRN
  Administered 2020-01-01 – 2020-01-02 (×7): 650 mg via ORAL
  Filled 2019-12-31 (×7): qty 2

## 2019-12-31 MED ORDER — NALOXONE HCL 4 MG/10ML IJ SOLN
1.0000 ug/kg/h | INTRAVENOUS | Status: DC | PRN
  Filled 2019-12-31: qty 5

## 2019-12-31 MED ORDER — ONDANSETRON HCL 4 MG/2ML IJ SOLN: 4.0000 mg | Freq: Three times a day (TID) | INTRAMUSCULAR | Status: DC | PRN

## 2019-12-31 MED ORDER — SIMETHICONE 80 MG PO CHEW
80.0000 mg | CHEWABLE_TABLET | Freq: Three times a day (TID) | ORAL | Status: DC
  Administered 2019-12-31 – 2020-01-02 (×5): 80 mg via ORAL
  Filled 2019-12-31 (×5): qty 1

## 2019-12-31 MED ORDER — ONDANSETRON HCL 4 MG/2ML IJ SOLN
INTRAMUSCULAR | Status: AC
  Filled 2019-12-31: qty 2

## 2019-12-31 MED ORDER — SIMETHICONE 80 MG PO CHEW
80.0000 mg | CHEWABLE_TABLET | ORAL | Status: DC
  Administered 2020-01-01 (×2): 80 mg via ORAL
  Filled 2019-12-31 (×2): qty 1

## 2019-12-31 MED ORDER — DIPHENHYDRAMINE HCL 25 MG PO CAPS: 25.0000 mg | ORAL_CAPSULE | ORAL | Status: DC | PRN

## 2019-12-31 MED ORDER — SODIUM CHLORIDE 0.9% FLUSH: 3.0000 mL | INTRAVENOUS | Status: DC | PRN

## 2019-12-31 MED ORDER — CEFAZOLIN SODIUM-DEXTROSE 2-4 GM/100ML-% IV SOLN
INTRAVENOUS | Status: AC
  Filled 2019-12-31: qty 100

## 2019-12-31 MED ORDER — MORPHINE SULFATE (PF) 0.5 MG/ML IJ SOLN
INTRAMUSCULAR | Status: DC | PRN
  Administered 2019-12-31: .15 mg via INTRATHECAL

## 2019-12-31 MED ORDER — FENTANYL CITRATE (PF) 100 MCG/2ML IJ SOLN
25.0000 ug | INTRAMUSCULAR | Status: DC | PRN
  Administered 2019-12-31: 25 ug via INTRAVENOUS

## 2019-12-31 MED ORDER — ANTITHROMBIN III (HUMAN) 500 UNITS IV SOLR
4117.0000 [IU] | Freq: Once | INTRAVENOUS | Status: AC
  Administered 2019-12-31: 4117 [IU] via INTRAVENOUS
  Filled 2019-12-31: qty 82.34

## 2019-12-31 MED ORDER — ENOXAPARIN SODIUM 120 MG/0.8ML ~~LOC~~ SOLN
120.0000 mg | Freq: Two times a day (BID) | SUBCUTANEOUS | Status: DC
  Administered 2020-01-01 – 2020-01-02 (×3): 120 mg via SUBCUTANEOUS
  Filled 2019-12-31 (×5): qty 0.8

## 2019-12-31 MED ORDER — SODIUM CHLORIDE 0.9 % IR SOLN
Status: DC | PRN
  Administered 2019-12-31: 1000 mL

## 2019-12-31 MED ORDER — HYDROMORPHONE HCL 1 MG/ML IJ SOLN: 0.2000 mg | INTRAMUSCULAR | Status: DC | PRN

## 2019-12-31 MED ORDER — DEXAMETHASONE SODIUM PHOSPHATE 10 MG/ML IJ SOLN
INTRAMUSCULAR | Status: AC
  Filled 2019-12-31: qty 1

## 2019-12-31 MED ORDER — NALBUPHINE HCL 10 MG/ML IJ SOLN
5.0000 mg | INTRAMUSCULAR | Status: DC | PRN
  Administered 2020-01-01 (×2): 5 mg via INTRAVENOUS
  Filled 2019-12-31 (×2): qty 1

## 2019-12-31 MED ORDER — SCOPOLAMINE 1 MG/3DAYS TD PT72
MEDICATED_PATCH | TRANSDERMAL | Status: AC
  Filled 2019-12-31: qty 1

## 2019-12-31 MED ORDER — MEPERIDINE HCL 25 MG/ML IJ SOLN: 6.2500 mg | INTRAMUSCULAR | Status: DC | PRN

## 2019-12-31 MED ORDER — NALBUPHINE HCL 10 MG/ML IJ SOLN: 5.0000 mg | Freq: Once | INTRAMUSCULAR | Status: DC | PRN

## 2019-12-31 MED ORDER — BUPIVACAINE IN DEXTROSE 0.75-8.25 % IT SOLN
INTRATHECAL | Status: DC | PRN
  Administered 2019-12-31: 1.6 mL via INTRATHECAL

## 2019-12-31 MED ORDER — WITCH HAZEL-GLYCERIN EX PADS: 1.0000 "application " | MEDICATED_PAD | CUTANEOUS | Status: DC | PRN

## 2019-12-31 MED ORDER — ONDANSETRON HCL 4 MG/2ML IJ SOLN
INTRAMUSCULAR | Status: DC | PRN
  Administered 2019-12-31: 4 mg via INTRAVENOUS

## 2019-12-31 MED ORDER — NALOXONE HCL 0.4 MG/ML IJ SOLN: 0.4000 mg | INTRAMUSCULAR | Status: DC | PRN

## 2019-12-31 MED ORDER — SODIUM CHLORIDE 0.9 % IV SOLN
INTRAVENOUS | Status: DC | PRN
Start: 2019-12-31 — End: 2019-12-31

## 2019-12-31 MED ORDER — PHENYLEPHRINE HCL-NACL 20-0.9 MG/250ML-% IV SOLN
INTRAVENOUS | Status: AC
  Filled 2019-12-31: qty 250

## 2019-12-31 MED ORDER — FENTANYL CITRATE (PF) 100 MCG/2ML IJ SOLN
INTRAMUSCULAR | Status: DC | PRN
  Administered 2019-12-31: 15 ug via INTRATHECAL

## 2019-12-31 MED ORDER — POVIDONE-IODINE 10 % EX SWAB
2.0000 "application " | Freq: Once | CUTANEOUS | Status: AC
  Administered 2019-12-31: 2 via TOPICAL

## 2019-12-31 MED ORDER — DEXAMETHASONE SODIUM PHOSPHATE 10 MG/ML IJ SOLN
INTRAMUSCULAR | Status: DC | PRN
Start: 2019-12-31 — End: 2019-12-31
  Administered 2019-12-31: 8 mg via INTRAVENOUS

## 2019-12-31 MED ORDER — ZOLPIDEM TARTRATE 5 MG PO TABS: 5.0000 mg | ORAL_TABLET | Freq: Every evening | ORAL | Status: DC | PRN

## 2019-12-31 MED ORDER — KETOROLAC TROMETHAMINE 30 MG/ML IJ SOLN
INTRAMUSCULAR | Status: AC
  Filled 2019-12-31: qty 1

## 2019-12-31 MED ORDER — ONDANSETRON HCL 4 MG/2ML IJ SOLN: 4.0000 mg | Freq: Once | INTRAMUSCULAR | Status: DC | PRN

## 2019-12-31 MED ORDER — OXYTOCIN-SODIUM CHLORIDE 30-0.9 UT/500ML-% IV SOLN: 2.5000 [IU]/h | INTRAVENOUS | Status: AC

## 2019-12-31 MED ORDER — DIPHENHYDRAMINE HCL 25 MG PO CAPS: 25.0000 mg | ORAL_CAPSULE | Freq: Four times a day (QID) | ORAL | Status: DC | PRN

## 2019-12-31 MED ORDER — SOD CITRATE-CITRIC ACID 500-334 MG/5ML PO SOLN
ORAL | Status: AC
  Filled 2019-12-31: qty 30

## 2019-12-31 MED ORDER — OXYCODONE HCL 5 MG PO TABS
5.0000 mg | ORAL_TABLET | ORAL | Status: DC | PRN
  Administered 2020-01-01 (×2): 5 mg via ORAL
  Administered 2020-01-01: 10 mg via ORAL
  Administered 2020-01-01 (×2): 5 mg via ORAL
  Administered 2020-01-02 (×2): 10 mg via ORAL
  Administered 2020-01-02: 5 mg via ORAL
  Filled 2019-12-31: qty 2
  Filled 2019-12-31: qty 1
  Filled 2019-12-31: qty 2
  Filled 2019-12-31 (×6): qty 1

## 2019-12-31 MED ORDER — SOD CITRATE-CITRIC ACID 500-334 MG/5ML PO SOLN
30.0000 mL | ORAL | Status: AC
  Administered 2019-12-31: 30 mL via ORAL

## 2019-12-31 MED ORDER — SENNOSIDES-DOCUSATE SODIUM 8.6-50 MG PO TABS
2.0000 | ORAL_TABLET | ORAL | Status: DC
  Administered 2020-01-01 (×2): 2 via ORAL
  Filled 2019-12-31 (×2): qty 2

## 2019-12-31 MED ORDER — PHENYLEPHRINE HCL-NACL 20-0.9 MG/250ML-% IV SOLN
INTRAVENOUS | Status: DC | PRN
  Administered 2019-12-31: 60 ug/min via INTRAVENOUS

## 2019-12-31 MED ORDER — SCOPOLAMINE 1 MG/3DAYS TD PT72
1.0000 | MEDICATED_PATCH | Freq: Once | TRANSDERMAL | Status: DC
  Administered 2019-12-31: 1.5 mg via TRANSDERMAL

## 2019-12-31 MED ORDER — MORPHINE SULFATE (PF) 0.5 MG/ML IJ SOLN
INTRAMUSCULAR | Status: AC
  Filled 2019-12-31: qty 10

## 2019-12-31 SURGICAL SUPPLY — 39 items
ADH SKN CLS APL DERMABOND .7 (GAUZE/BANDAGES/DRESSINGS)
APL SKNCLS STERI-STRIP NONHPOA (GAUZE/BANDAGES/DRESSINGS) ×1
BENZOIN TINCTURE PRP APPL 2/3 (GAUZE/BANDAGES/DRESSINGS) ×2 IMPLANT
CHLORAPREP W/TINT 26ML (MISCELLANEOUS) ×3 IMPLANT
CLAMP CORD UMBIL (MISCELLANEOUS) IMPLANT
CLOSURE WOUND 1/2 X4 (GAUZE/BANDAGES/DRESSINGS) ×1
CLOTH BEACON ORANGE TIMEOUT ST (SAFETY) ×3 IMPLANT
DERMABOND ADVANCED (GAUZE/BANDAGES/DRESSINGS)
DERMABOND ADVANCED .7 DNX12 (GAUZE/BANDAGES/DRESSINGS) IMPLANT
DRSG OPSITE POSTOP 4X10 (GAUZE/BANDAGES/DRESSINGS) ×3 IMPLANT
ELECT REM PT RETURN 9FT ADLT (ELECTROSURGICAL) ×3
ELECTRODE REM PT RTRN 9FT ADLT (ELECTROSURGICAL) ×1 IMPLANT
EXTRACTOR VACUUM M CUP 4 TUBE (SUCTIONS) IMPLANT
EXTRACTOR VACUUM M CUP 4' TUBE (SUCTIONS)
GLOVE BIO SURGEON STRL SZ7.5 (GLOVE) ×3 IMPLANT
GLOVE BIOGEL PI IND STRL 7.0 (GLOVE) ×1 IMPLANT
GLOVE BIOGEL PI INDICATOR 7.0 (GLOVE) ×2
GOWN STRL REUS W/TWL LRG LVL3 (GOWN DISPOSABLE) ×6 IMPLANT
KIT ABG SYR 3ML LUER SLIP (SYRINGE) ×3 IMPLANT
NDL HYPO 25X5/8 SAFETYGLIDE (NEEDLE) ×1 IMPLANT
NEEDLE HYPO 25X5/8 SAFETYGLIDE (NEEDLE) ×3 IMPLANT
NS IRRIG 1000ML POUR BTL (IV SOLUTION) ×3 IMPLANT
PACK C SECTION WH (CUSTOM PROCEDURE TRAY) ×3 IMPLANT
PAD OB MATERNITY 4.3X12.25 (PERSONAL CARE ITEMS) ×3 IMPLANT
PENCIL SMOKE EVAC W/HOLSTER (ELECTROSURGICAL) ×3 IMPLANT
SPONGE LAP 18X18 X RAY DECT (DISPOSABLE) ×2 IMPLANT
STRIP CLOSURE SKIN 1/2X4 (GAUZE/BANDAGES/DRESSINGS) ×1 IMPLANT
SUT MNCRL 0 VIOLET CTX 36 (SUTURE) ×4 IMPLANT
SUT MONOCRYL 0 CTX 36 (SUTURE) ×12
SUT PDS AB 0 CTX 60 (SUTURE) ×3 IMPLANT
SUT PLAIN 0 NONE (SUTURE) IMPLANT
SUT PLAIN 2 0 (SUTURE)
SUT PLAIN 2 0 XLH (SUTURE) IMPLANT
SUT PLAIN ABS 2-0 CT1 27XMFL (SUTURE) IMPLANT
SUT VIC AB 4-0 KS 27 (SUTURE) ×3 IMPLANT
TOWEL OR 17X24 6PK STRL BLUE (TOWEL DISPOSABLE) ×3 IMPLANT
TRAY FOLEY W/BAG SLVR 14FR LF (SET/KITS/TRAYS/PACK) ×3 IMPLANT
VACUUM CUP M-STYLE MYSTIC II (SUCTIONS) ×2 IMPLANT
WATER STERILE IRR 1000ML POUR (IV SOLUTION) ×3 IMPLANT

## 2019-12-31 NOTE — Transfer of Care (Signed)
Immediate Anesthesia Transfer of Care Note  Patient: Sherry Ray  Procedure(s) Performed: CESAREAN SECTION (N/A )  Patient Location: PACU  Anesthesia Type:Spinal  Level of Consciousness: awake  Airway & Oxygen Therapy: Patient Spontanous Breathing  Post-op Assessment: Report given to RN  Post vital signs: Reviewed and stable  Last Vitals:  Vitals Value Taken Time  BP 116/63 12/31/19 1318  Temp    Pulse 107 12/31/19 1320  Resp 19 12/31/19 1320  SpO2 100 % 12/31/19 1320  Vitals shown include unvalidated device data.  Last Pain:  Vitals:   12/31/19 0918  TempSrc: Oral         Complications: No complications documented.

## 2019-12-31 NOTE — Progress Notes (Signed)
No changes to H&P per patient history Reviewed procedure-repeat cesarean section All questions answered Patient states she understands and agrees AT III per Dr Lindi Adie

## 2019-12-31 NOTE — Anesthesia Preprocedure Evaluation (Addendum)
Anesthesia Evaluation  Patient identified by MRN, date of birth, ID band Patient awake    Reviewed: Allergy & Precautions, H&P , NPO status , Patient's Chart, lab work & pertinent test results  History of Anesthesia Complications Negative for: history of anesthetic complications  Airway Mallampati: II  TM Distance: >3 FB Neck ROM: full    Dental no notable dental hx.    Pulmonary PE   Pulmonary exam normal        Cardiovascular + DVT  Normal cardiovascular exam Rhythm:regular Rate:Normal     Neuro/Psych negative neurological ROS  negative psych ROS   GI/Hepatic negative GI ROS, Neg liver ROS,   Endo/Other  negative endocrine ROS  Renal/GU negative Renal ROS  negative genitourinary   Musculoskeletal   Abdominal   Peds  Hematology ATIII deficiency w/ h/o DVT/PE; on Lovenox/SQH- last dose heparin on 12/30/19; followed by hematology   Anesthesia Other Findings  1 prior C/S  Reproductive/Obstetrics (+) Pregnancy                            Anesthesia Physical Anesthesia Plan  ASA: III  Anesthesia Plan: Spinal   Post-op Pain Management:    Induction:   PONV Risk Score and Plan: 3 and Ondansetron and Treatment may vary due to age or medical condition  Airway Management Planned: Natural Airway  Additional Equipment: None  Intra-op Plan:   Post-operative Plan:   Informed Consent: I have reviewed the patients History and Physical, chart, labs and discussed the procedure including the risks, benefits and alternatives for the proposed anesthesia with the patient or authorized representative who has indicated his/her understanding and acceptance.       Plan Discussed with:   Anesthesia Plan Comments: (Case discussed with hematologist Dr. Lindi Adie. He has ordered ATIII to be given prior to surgery which we will administer on arrival to OR. SQ heparin has been held since last night and  ATIII should not increase risk of epidural hematoma. )       Anesthesia Quick Evaluation

## 2019-12-31 NOTE — Brief Op Note (Signed)
12/31/2019  12:57 PM  PATIENT:  [name]  39 y.o. female  PRE-OPERATIVE DIAGNOSIS:  PREVIOUS CESAREAN AND TERM  POST-OPERATIVE DIAGNOSIS:  PREVIOUS CESAREAN AND TERM  PROCEDURE:  Procedure(s) with comments: CESAREAN SECTION (N/A) - Heather, RNFA  SURGEON:  Surgeon(s) and Role:    * Everlene Farrier, MD - Primary  PHYSICIAN ASSISTANT:   ASSISTANTS: Occupational hygienist RNFA   ANESTHESIA:   spinal  EBL:  Per anesthesia note   BLOOD ADMINISTERED:none  DRAINS: Urinary Catheter (Foley)   LOCAL MEDICATIONS USED:  NONE  SPECIMEN:  Source of Specimen:  placenta  DISPOSITION OF SPECIMEN:  PATHOLOGY  COUNTS:  YES  TOURNIQUET:  * No tourniquets in log *  DICTATION: .Other Dictation: Dictation Number [id number]  PLAN OF CARE: Admit to inpatient   PATIENT DISPOSITION:  PACU - hemodynamically stable.   Delay start of Pharmacological VTE agent (>24hrs) due to surgical blood loss or risk of bleeding: not applicable

## 2019-12-31 NOTE — Anesthesia Postprocedure Evaluation (Signed)
Anesthesia Post Note  Patient: Sherry Ray  Procedure(s) Performed: CESAREAN SECTION (N/A )     Patient location during evaluation: PACU Anesthesia Type: Spinal Level of consciousness: oriented and awake and alert Pain management: pain level controlled Vital Signs Assessment: post-procedure vital signs reviewed and stable Respiratory status: spontaneous breathing, respiratory function stable and nonlabored ventilation Cardiovascular status: blood pressure returned to baseline and stable Postop Assessment: no headache, no backache, no apparent nausea or vomiting and spinal receding Anesthetic complications: no   No complications documented.  Last Vitals:  Vitals:   12/31/19 1415 12/31/19 1443  BP: 99/72 111/71  Pulse: 84 85  Resp: 15 18  Temp:  36.5 C  SpO2: 98% 97%    Last Pain:  Vitals:   12/31/19 1445  TempSrc:   PainSc: 0-No pain   Pain Goal:                   Lidia Collum

## 2019-12-31 NOTE — Lactation Note (Addendum)
This note was copied from a baby's chart. Lactation Consultation Note  Patient Name: Sherry Ray FXJOI'T Date: 12/31/2019 Reason for consult: Initial assessment;Term P2, 6 hour term, LGA infant greater than ( 9 lbs)  female infant. Mom with hx: C/S delivery and depression. Mom does have DEBP at home. Infant had void diaper while LC was in the room. Per mom, this is infant's third time latching at breast, infant BF 1st time for 30 minutes and 2nd time for 30 minutes both on left breast.  Per mom, her 1st son who is two had tongue tie but is still BF. Infant does appear have slight short frenulum he doesn't extend tongue pass gum line, LC asked mom ask Peds to examine infant's tongue. Mom want LC to assist with latching infant on her right breast due her two year old son who she is still breastfeeding refusing to take it. Infant latched in football hold, but was not sustaining latch, had good swallows when latched mom will continue to work with infant latching on her right breast, infant BF for 8 minutes. Mom hand expressed afterwards and infant was given 8 mls of colostrum by spoon, mom has good supply and was spraying milk with hand expression. Mom was doing STS with infant as [place] left the room. Mom will continue to NF infant according to hunger cues, on demand, 8 to 12+ times within 24 hours. Mom knows to call RN or LC if she has any questions, concerns or need assistance with latching infant at breast.  Mom knows to breastfeed infant first and then afterwards breastfeed toddler if she is still plan to BF both children. Mom made aware of O/P services, breastfeeding support groups, community resources, and our phone # for post-discharge questions.  Maternal Data Formula Feeding for Exclusion: Yes Reason for exclusion: Mother's choice to formula and breast feed on admission Has patient been taught Hand Expression?: Yes Does the patient have breastfeeding experience prior to this  delivery?: Yes  Feeding Feeding Type: Breast Fed  LATCH Score Latch: Repeated attempts needed to sustain latch, nipple held in mouth throughout feeding, stimulation needed to elicit sucking reflex.  Audible Swallowing: A few with stimulation  Type of Nipple: Everted at rest and after stimulation  Comfort (Breast/Nipple): Soft / non-tender  Hold (Positioning): Assistance needed to correctly position infant at breast and maintain latch.  LATCH Score: 7  Interventions Interventions: Breast feeding basics reviewed;Assisted with latch;Skin to skin;Breast massage;Hand express;Pre-pump if needed;Breast compression;Adjust position;Support pillows;Position options;Expressed milk  Lactation Tools Discussed/Used WIC Program: No   Consult Status Consult Status: Follow-up Date: 01/01/20 Follow-up type: In-patient    Vicente Serene 12/31/2019, 6:39 PM

## 2019-12-31 NOTE — Op Note (Signed)
NAME: Sherry Ray, Sherry Ray MEDICAL RECORD [id number] ACCOUNT [id number] DATE OF BIRTH:[birth year] FACILITY: [facility] LOCATION: [facility] PHYSICIAN:Mihaela Fajardo E. Taariq Leitz II, MD  OPERATIVE REPORT  DATE OF PROCEDURE:  12/31/2019  PREOPERATIVE DIAGNOSES: 1.  Intrauterine pregnancy 39-2/7 weeks. 2.  Previous cesarean section, desires repeat.  POSTOPERATIVE DIAGNOSES:   1.  Intrauterine pregnancy 39-2/7 weeks. 2.  Previous cesarean section, desires repeat.   PROCEDURE:  Repeat low transverse cesarean section.  SURGEON:  Everlene Farrier, MD   ANESTHESIA:  Spinal.  SPECIMENS:  Placenta to pathology.  FINDINGS:  Viable female infant.  Apgars, arterial cord pH, birth weight pending.  INDICATIONS AND CONSENT:  This patient is a 39 year old G2 P1 at 45 and 2 with a history of cesarean section.  She desires repeat.  Pregnancy is complicated by antithrombin III deficiency, managed by Dr. Lindi Adie.  She has been anticoagulated.   Anticoagulation was stopped yesterday and her antithrombin III is administered during surgery per Dr. Geralyn Flash orders.  Potential risks and complications are discussed with the patient preoperatively including but not limited to infection, organ damage,  bleeding requiring transfusion of blood products with HIV and hepatitis acquisition, DVT, PE, pneumonia.  The patient states she understands and agrees and consent was signed on the chart.  DESCRIPTION OF PROCEDURE:  The patient was taken to the operating room where she was identified.  Antithrombin III infusion was started per Dr. Geralyn Flash orders.  Spinal anesthetic was placed.  She was placed in the dorsal supine position with a 15-degree  left lateral wedge.  She was prepped vaginally with Betadine.  Foley catheter was placed and prepped abdominally with ChloraPrep.  Three minute drying time was observed.  Timeout was done.  She was draped in a sterile fashion.  After testing for  adequate spinal anesthesia, skin was entered  through the Pfannenstiel scar and dissection was carried out in layers to the peritoneum, which was taken down superiorly and inferiorly.  Vesicouterine peritoneum was taken down cephalad laterally and the  bladder flap was developed.  Uterus was incised in a low transverse manner and the uterine cavity was entered bluntly with a hemostat.  The incision was extended with the fingers.  Artificial rupture of membranes for clear fluid was carried out.  Baby  was then delivered from the vertex presentation with the aid of a vacuum extractor to elevate the head through the incision.  This was done without pop-offs and without difficulty.  Remainder of the baby was then delivered without difficulty.  Good cry  and tone is noted.  After 1 minute, the cord was clamped and cut and the baby was handed to the waiting pediatrics team.  Placenta is manually delivered and sent to pathology.  Uterine cavity was clean.  Uterus was closed in two running locking  imbricating layers of 0 Monocryl suture, which achieved good hemostasis.  A couple of adhesions of the omentum to the anterior abdominal wall were taken down under good visualization.  Lavage was carried out.  Anterior peritoneum was closed in a running  fashion with 0 Monocryl suture, which was also used to reapproximate the pyramidalis muscle in the midline.  Anterior rectus fascia was closed in a running fashion with a 0 looped PDS.  Subcutaneous layer was closed with interrupted plain and the skin  was closed in a subcuticular fashion with 4-0 Vicryl on a Keith needle.  Benzoin, Steri-Strips, honeycomb and pressure dressing is applied.  The patient was taken to recovery room in stable condition.  All  counts were correct.  CN/NUANCE  D:12/31/2019 T:12/31/2019 JOB:[id number]

## 2019-12-31 NOTE — Anesthesia Procedure Notes (Signed)
Spinal  Patient location during procedure: OR Staffing Performed: anesthesiologist  Anesthesiologist: Korbyn Chopin E, MD Preanesthetic Checklist Completed: patient identified, IV checked, risks and benefits discussed, surgical consent, monitors and equipment checked, pre-op evaluation and timeout performed Spinal Block Patient position: sitting Prep: DuraPrep and site prepped and draped Patient monitoring: continuous pulse ox, blood pressure and heart rate Approach: midline Location: L3-4 Injection technique: single-shot Needle Needle type: Pencan  Needle gauge: 24 G Needle length: 9 cm Additional Notes Functioning IV was confirmed and monitors were applied. Sterile prep and drape, including hand hygiene and sterile gloves were used. The patient was positioned and the spine was prepped. The skin was anesthetized with lidocaine.  Free flow of clear CSF was obtained prior to injecting local anesthetic into the CSF. The needle was carefully withdrawn. The patient tolerated the procedure well.      

## 2020-01-01 LAB — CBC
HCT: 30.5 % — ABNORMAL LOW (ref 36.0–46.0)
Hemoglobin: 9.8 g/dL — ABNORMAL LOW (ref 12.0–15.0)
MCH: 26.3 pg (ref 26.0–34.0)
MCHC: 32.1 g/dL (ref 30.0–36.0)
MCV: 82 fL (ref 80.0–100.0)
Platelets: 224 10*3/uL (ref 150–400)
RBC: 3.72 MIL/uL — ABNORMAL LOW (ref 3.87–5.11)
RDW: 18.3 % — ABNORMAL HIGH (ref 11.5–15.5)
WBC: 24.3 10*3/uL — ABNORMAL HIGH (ref 4.0–10.5)
nRBC: 0 % (ref 0.0–0.2)

## 2020-01-01 LAB — ANTITHROMBIN III: AntiThromb III Func: 90 % (ref 75–120)

## 2020-01-01 NOTE — Progress Notes (Signed)
Pt asking about blood work being done 4-5 hours after receiving first dose of Lovenox (120mg  due at 0800 today).  She states she has been on this med during pregnancy and needs to know if this dose will work for her postpartum.  She needs to order it thru a mail order RX.  States she would like her hematologist at [facility] informed about this.    Will ask attending when rounding., or will page/call.

## 2020-01-01 NOTE — Progress Notes (Signed)
Pt states she has about 4 days worth of Lovenox shots at home, which are 150mg  dose.  She also would like to be able to have the labs drawn as an outpt if at all possible so that she can get d/c home tomorrow (01/02/20)

## 2020-01-01 NOTE — Progress Notes (Signed)
MOB was referred for history of depression/anxiety. * Referral screened out by Clinical Social Worker because none of the following criteria appear to apply: ~ History of anxiety/depression during this pregnancy, or of post-partum depression following prior delivery. ~ Diagnosis of anxiety and/or depression within last 3 years. Per further chart review, it appears that MOB was diagnosed with anxiety/depression in 2017.  OR *MOB's symptoms currently being treated with medication and/or therapy. It is also noted that MOB is on Zoloft for anxiety/depression.   CSW aware that MOB scored 4 on Edinburgh with no concerns to CSW. Please reconsult if MOB wished to speak with CSW.   Virgie Dad Kao Conry, MSW, LCSW [facility] [place] [facility] [place] [phone]

## 2020-01-01 NOTE — Progress Notes (Signed)
Spoke to Dr. Lindi Adie and he states pt needs to take several doses of Lovenox before drawing labs, maybe even a whole week after beginning to take this med.    Will inform pt of this.

## 2020-01-01 NOTE — Progress Notes (Addendum)
Subjective: Postpartum Day 1: Cesarean Delivery Patient reports tolerating PO and no problems voiding.    Objective: Vital signs in last 24 hours: Temp:  [97.6 F (36.4 C)-98.3 F (36.8 C)] 97.8 F (36.6 C) (07/20 0740) Pulse Rate:  [68-107] 70 (07/20 0740) Resp:  [14-20] 16 (07/20 0740) BP: (98-125)/(50-77) 107/59 (07/20 0740) SpO2:  [97 %-100 %] 98 % (07/20 0740) Weight:  [81.6 kg] 81.6 kg (07/19 0918)  Physical Exam:  General: alert and cooperative Lochia: appropriate Uterine Fundus: firm Incision: healing well DVT Evaluation: No evidence of DVT seen on physical exam.  Recent Labs    12/29/19 0848 01/01/20 0539  HGB 12.7 9.8*  HCT 39.7 30.5*    Assessment/Plan: Status post Cesarean section. Doing well postoperatively.  Continue current care. No s/s VTE, s/p thrombate III intrapartum now transitioned to lovenox 120mg . This is being followed by Dr. Lindi Adie.  D/W patient female infant circumcision, risks/benefits reviewed. Dr. Lindi Adie and their pediatric urologist have voiced to them that a circ here has risks that are very small even with Sherry Ray's hx of clotting disorder. They elect to have it performed here. All questions answered.   Sherry Ray Sherry Ray 01/01/2020, 8:24 AM

## 2020-01-01 NOTE — Lactation Note (Signed)
This note was copied from a baby's chart. Lactation Consultation Note  Patient Name: Boy Kenadi Miltner APOLI'D Date: 01/01/2020  [facility] entered room and grandmother holding baby content.  Mom reports she feels breastfeeding is going well. Mom reports he is still learning.  Mom denies need for lactation services at this time.  Mom reports he is still not taking the right breast as well.  Discussed hand expression and manually everting the nipple to help him latch easier.  Mom reports pediatrician checked his tongue and that it was felt he was not tongue tied. Infant has had adequate voids and stools.  Urged mom to hand express and rub expressed mothers milk on nipples and air dry.  Urged to call lactation as needed.   Maternal Data    Feeding    LATCH Score                   Interventions    Lactation Tools Discussed/Used     Consult Status      Aava Deland Thompson Caul 01/01/2020, 5:21 PM

## 2020-01-02 ENCOUNTER — Other Ambulatory Visit: Payer: Self-pay | Admitting: *Deleted

## 2020-01-02 LAB — SURGICAL PATHOLOGY

## 2020-01-02 LAB — ANTITHROMBIN III: AntiThromb III Func: 77 % (ref 75–120)

## 2020-01-02 LAB — HEPARIN ANTI-XA: Heparin LMW: 0.98 IU/mL

## 2020-01-02 MED ORDER — OXYCODONE HCL 5 MG PO TABS: 5.0000 mg | ORAL_TABLET | ORAL | 0 refills | Status: DC | PRN

## 2020-01-02 MED ORDER — IBUPROFEN 600 MG PO TABS: 600.0000 mg | ORAL_TABLET | Freq: Four times a day (QID) | ORAL | 0 refills | Status: DC | PRN

## 2020-01-02 MED ORDER — IBUPROFEN 600 MG PO TABS
600.0000 mg | ORAL_TABLET | Freq: Four times a day (QID) | ORAL | Status: DC | PRN
  Filled 2020-01-02: qty 1

## 2020-01-02 NOTE — Progress Notes (Signed)
Lab and MD visit scheduled. Pt made aware and verbalized understanding

## 2020-01-02 NOTE — Progress Notes (Signed)
Patient Care Team: Patient, No Pcp Per as PCP - General (General Practice)  DIAGNOSIS:    ICD-10-CM   1. Antithrombin deficiency ([name])  D68.59 Heparin Anti-Xa    CHIEF COMPLIANT: Follow-up of antithrombin deficiency  INTERVAL HISTORY: Sherry Ray is a 39 y.o. with above-mentioned history of antithrombin deficiency on treatment with Lovenox.She gave birth to a baby boy via cesarean section on 9/38/10 without complications. She presents to the clinic todayfor follow-up to discuss the plan for postpartum care.   She is currently on Lovenox injections 120 mg twice daily.  I suspect that she will need less Lovenox down the line.  She has another 4 more days of Lovenox supply left.  She and baby are doing quite well.  ALLERGIES:  has No Known Allergies.  MEDICATIONS:  Current Outpatient Medications  Medication Sig Dispense Refill  . cetirizine (ZYRTEC) 10 MG tablet Take 10 mg by mouth daily.     Marland Kitchen docusate sodium (COLACE) 100 MG capsule Take 100 mg by mouth daily.    Marland Kitchen enoxaparin (LOVENOX) 150 MG/ML injection Inject 0.66 mLs (99 mg total) into the skin every 12 (twelve) hours. Please provide 70 doses of Lovenox 70 mL 0  . ibuprofen (ADVIL) 600 MG tablet Take 1 tablet (600 mg total) by mouth every 6 (six) hours as needed for mild pain, moderate pain or cramping. 30 tablet 0  . oxyCODONE (OXY IR/ROXICODONE) 5 MG immediate release tablet Take 1-2 tablets (5-10 mg total) by mouth every 4 (four) hours as needed for moderate pain. 30 tablet 0  . Prenatal Vit-Fe Fumarate-FA (PRENATAL VITAMINS PO) Take 1 tablet by mouth daily.      No current facility-administered medications for this visit.    PHYSICAL EXAMINATION: ECOG PERFORMANCE STATUS: 1 - Symptomatic but completely ambulatory  Vitals:   01/03/20 1334  BP: 124/66  Pulse: 98  Resp: 16  Temp: 98.9 F (37.2 C)  SpO2: 100%   Filed Weights   01/03/20 1334  Weight: 167 lb 11.2 oz (76.1 kg)    LABORATORY DATA:  I have  reviewed the data as listed CMP Latest Ref Rng & Units 06/06/2018 02/04/2018 04/28/2017  Glucose 65 - 99 mg/dL 100(H) 100(H) 65  BUN 6 - 20 mg/dL 20 17 12   Creatinine 0.57 - 1.00 mg/dL 0.68 0.80 0.74  Sodium 134 - 144 mmol/L 137 141 141  Potassium 3.5 - 5.2 mmol/L 4.4 3.9 4.2  Chloride 96 - 106 mmol/L 101 105 101  CO2 20 - 29 mmol/L 22 26 23   Calcium 8.7 - 10.2 mg/dL 9.7 9.5 9.0  Total Protein 6.0 - 8.5 g/dL 7.1 7.2 7.2  Total Bilirubin 0.0 - 1.2 mg/dL 0.2 0.4 <0.2  Alkaline Phos 39 - 117 IU/L 123(H) 135(H) 82  AST 0 - 40 IU/L 24 42(H) 22  ALT 0 - 32 IU/L 40(H) 45(H) 26    Lab Results  Component Value Date   WBC 24.3 (H) 01/01/2020   HGB 9.8 (L) 01/01/2020   HCT 30.5 (L) 01/01/2020   MCV 82.0 01/01/2020   PLT 224 01/01/2020   NEUTROABS 4.3 06/06/2018    ASSESSMENT & PLAN:  Antithrombin deficiency ([place]) 02/13/2007 bilateral pulmonary emboli bilateral lower lobes and right middle lobe, right lower lobe pulmonary infarct 3 cm exophytic lesion right scapula with aggressive features question chondrosarcoma  Hypercoagulable workup in 2008 reviewed mild Antithrombin deficiency of 45% Since then patient had Antithrombin levels checked and they had been between 40-50%.  Patient delivered July 2020  baby boy at [facility] by C-section. Patient delivered her second baby on 12/31/2019 at [facility] by C-section She received Antithrombin infusions 1 dose prepartum   Current treatment: Lovenox injections 120 mcg subcu twice daily  Antiten a level 01/02/2020: 0.9 As the pregnancy volume comes down we may require lower doses of the Lovenox injections. I sent a prescription for 100 mg subcu twice daily of Lovenox to [place]. She will start this after another 4 more days.  We will have her come back in 2 weeks for lab check for antiten a level. If that level is between 0.5 and 1.2 then she can finish the rest of the anticoagulation with Lovenox.  Our plan is to treat her  for a total of 6 weeks.  If she ever were to require surgery we might need to redose her with Antithrombin III. Return to clinic on an as-needed basis.    Orders Placed This Encounter  Procedures  . Heparin Anti-Xa    Standing Status:   Future    Standing Expiration Date:   01/02/2021    Order Specific Question:   Release to patient    Answer:   Immediate   The patient has a good understanding of the overall plan. she agrees with it. she will call with any problems that may develop before the next visit here.  Total time spent: 20 mins including face to face time and time spent for planning, charting and coordination of care  Sherry Lose, MD 01/03/2020  I, Sherry Ray, am acting as scribe for Dr. Nicholas Ray.  I have reviewed the above documentation for accuracy and completeness, and I agree with the above.

## 2020-01-02 NOTE — Assessment & Plan Note (Signed)
02/13/2007 bilateral pulmonary emboli bilateral lower lobes and right middle lobe, right lower lobe pulmonary infarct 3 cm exophytic lesion right scapula with aggressive features question chondrosarcoma  Hypercoagulable workup in 2008 reviewed mild Antithrombin deficiency of 45% Since then patient had Antithrombin levels checked and they had been between 40-50%.  Patient delivered July 2020 baby boy at [facility] by C-section. Patient delivered her second baby on 12/31/2019 at [facility] by C-section She received Antithrombin infusions 1 dose prepartum   Current treatment: Lovenox injections 120 mcg subcu twice daily  Antiten a level 01/02/2020: 0.9 As the pregnancy volume comes down we may require lower doses of the Lovenox injections.  Return to clinic next week for antiten a level check.

## 2020-01-02 NOTE — Discharge Summary (Signed)
Postpartum Discharge Summary       Patient Name: Sherry Ray DOB: [birth year] MRN: [id number]  Date of admission: 12/31/2019 Delivery date:12/31/2019  Delivering provider: Everlene Ray  Date of discharge: 01/02/2020  Admitting diagnosis: History of cesarean section [Z98.891] Term pregnancy [Z34.90] Intrauterine pregnancy: [redacted]w[redacted]d    Secondary diagnosis:  Active Problems:   History of cesarean section   Term pregnancy  Additional problems: anitcoagulated    Discharge diagnosis: Term Pregnancy Delivered                                              Post partum procedures:  Augmentation:   Complications: None  Hospital course: Sceduled C/S   39y.o. yo G2P2002 at 311w2das admitted to the hospital 12/31/2019 for scheduled cesarean section with the following indication:Elective Repeat.Delivery details are as follows:  Membrane Rupture Time/Date: 12:30 PM ,12/31/2019   Delivery Method:C-Section, Vacuum Assisted  Details of operation can be found in separate operative note.  Patient had an uncomplicated postpartum course.  She is ambulating, tolerating a regular diet, passing flatus, and urinating well. Patient is discharged home in stable condition on  01/02/20        Newborn Data: Birth date:12/31/2019  Birth time:12:31 PM  Gender:Female  Living status:Living  Apgars:8 ,8  Weight:4099 g     Magnesium Sulfate received: No BMZ received: No Rhophylac:N/A MMR:No T-DaP:Given prenatally Flu: N/A Transfusion:No  Physical exam  Vitals:   01/01/20 1500 01/01/20 1536 01/01/20 2230 01/02/20 0526  BP: 96/80 99/62 (!) 104/56 (!) 93/51  Pulse: 92 91 78 84  Resp: _0 Temp:  97.8 F (36.6 C) 98 F (36.7 C) 97.8 F (36.6 C)  TempSrc:  Oral Oral Oral  SpO2: 99% 98%  98%  Weight:      Height:       General: alert, cooperative and no distress Lochia: appropriate Uterine Fundus: firm Incision: Healing well with no significant drainage DVT Evaluation: No evidence of  DVT seen on physical exam. Labs: Lab Results  Component Value Date   WBC 24.3 (H) 01/01/2020   HGB 9.8 (L) 01/01/2020   HCT 30.5 (L) 01/01/2020   MCV 82.0 01/01/2020   PLT 224 01/01/2020   CMP Latest Ref Rng & Units 06/06/2018  Glucose 65 - 99 mg/dL 100(H)  BUN 6 - 20 mg/dL 20  Creatinine 0.57 - 1.00 mg/dL 0.68  Sodium 134 - 144 mmol/L 137  Potassium 3.5 - 5.2 mmol/L 4.4  Chloride 96 - 106 mmol/L 101  CO2 20 - 29 mmol/L 22  Calcium 8.7 - 10.2 mg/dL 9.7  Total Protein 6.0 - 8.5 g/dL 7.1  Total Bilirubin 0.0 - 1.2 mg/dL 0.2  Alkaline Phos 39 - 117 IU/L 123(H)  AST 0 - 40 IU/L 24  ALT 0 - 32 IU/L 40(H)   EdFlavia Shippercore: Edinburgh Postnatal Depression Scale Screening Tool 12/31/2019  I have been able to laugh and see the funny side of things. 0  I have looked forward with enjoyment to things. 0  I have blamed myself unnecessarily when things went wrong. 1  I have been anxious or worried for no good reason. 1  I have felt scared or panicky for no good reason. 1  Things have been getting on top of me. 0  I have been so unhappy that I have  had difficulty sleeping. 0  I have felt sad or miserable. 1  I have been so unhappy that I have been crying. 0  The thought of harming myself has occurred to me. 0  Edinburgh Postnatal Depression Scale Total 4      After visit meds:  Allergies as of 01/02/2020   No Known Allergies     Medication List    STOP taking these medications   UNISOM PO     TAKE these medications   cetirizine 10 MG tablet Commonly known as: ZYRTEC Take 10 mg by mouth daily.   docusate sodium 100 MG capsule Commonly known as: COLACE Take 100 mg by mouth daily.   enoxaparin 150 MG/ML injection Commonly known as: LOVENOX INJECT 1 ML (150 MG TOTAL) UNDER THE SKIN EVERY 12 HOURS   heparin 10000 UNIT/ML injection Inject 0.75 mLs (7,500 Units total) into the skin every 12 (twelve) hours.   hydrOXYzine 25 MG tablet Commonly known as:  ATARAX/VISTARIL Take 25 mg by mouth 3 (three) times daily as needed for itching.   ibuprofen 600 MG tablet Commonly known as: ADVIL Take 1 tablet (600 mg total) by mouth every 6 (six) hours as needed for mild pain, moderate pain or cramping.   oxyCODONE 5 MG immediate release tablet Commonly known as: Oxy IR/ROXICODONE Take 1-2 tablets (5-10 mg total) by mouth every 4 (four) hours as needed for moderate pain.   PRENATAL VITAMINS PO Take 1 tablet by mouth daily.   SYRINGE-NEEDLE (DISP) 3 ML 25G X 5/8" 3 ML Misc For home heparin subcutaneous injection        Discharge home in stable condition Infant Feeding: Breast Infant Disposition:home with mother Discharge instruction: per After Visit Summary and Postpartum booklet. Activity: Advance as tolerated. Pelvic rest for 6 weeks.  Diet: routine diet Anticipated Birth Control: Unsure Postpartum Appointment:6 weeks Additional Postpartum F/U:  with hematology regarding anticoagulation Future Appointments:No future appointments. Follow up Visit:      01/02/2020 Luz Lex, MD

## 2020-01-03 ENCOUNTER — Other Ambulatory Visit: Payer: Self-pay

## 2020-01-03 ENCOUNTER — Inpatient Hospital Stay: Payer: 59

## 2020-01-03 ENCOUNTER — Inpatient Hospital Stay: Payer: 59 | Attending: Hematology and Oncology | Admitting: Hematology and Oncology

## 2020-01-03 DIAGNOSIS — Z86711 Personal history of pulmonary embolism: Secondary | ICD-10-CM | POA: Insufficient documentation

## 2020-01-03 DIAGNOSIS — Z791 Long term (current) use of non-steroidal anti-inflammatories (NSAID): Secondary | ICD-10-CM | POA: Insufficient documentation

## 2020-01-03 DIAGNOSIS — D6859 Other primary thrombophilia: Secondary | ICD-10-CM | POA: Insufficient documentation

## 2020-01-03 DIAGNOSIS — Z79899 Other long term (current) drug therapy: Secondary | ICD-10-CM | POA: Diagnosis not present

## 2020-01-03 DIAGNOSIS — Z7901 Long term (current) use of anticoagulants: Secondary | ICD-10-CM | POA: Diagnosis not present

## 2020-01-03 LAB — HEPARIN ANTI-XA: Heparin LMW: 0.79 IU/mL

## 2020-01-03 MED ORDER — ENOXAPARIN SODIUM 150 MG/ML ~~LOC~~ SOLN: 100.0000 mg | Freq: Two times a day (BID) | SUBCUTANEOUS | 0 refills | Status: DC

## 2020-01-04 ENCOUNTER — Telehealth (HOSPITAL_COMMUNITY): Payer: Self-pay | Admitting: Lactation Services

## 2020-01-04 ENCOUNTER — Telehealth: Payer: Self-pay | Admitting: Hematology and Oncology

## 2020-01-04 NOTE — Telephone Encounter (Signed)
Scheduled per 7/22 los. Called and spoke with pt, confirmed 8/5 appt

## 2020-01-04 NOTE — Telephone Encounter (Signed)
Request OP lactation appt.  Message sent.

## 2020-01-07 ENCOUNTER — Telehealth (HOSPITAL_COMMUNITY): Payer: Self-pay | Admitting: Lactation Services

## 2020-01-07 NOTE — Telephone Encounter (Signed)
Yitta called requesting an OP lactation appointment.  Called back and left message with Felicita, that a message was sent to clinic.  Also gave her the direct phone number to call in the am. Sherry Ray reports having latching and milk supply issues with her 52 week old baby.

## 2020-01-17 ENCOUNTER — Other Ambulatory Visit: Payer: Self-pay

## 2020-01-17 ENCOUNTER — Inpatient Hospital Stay: Payer: 59 | Attending: Hematology and Oncology

## 2020-01-17 DIAGNOSIS — D6859 Other primary thrombophilia: Secondary | ICD-10-CM | POA: Insufficient documentation

## 2020-01-17 DIAGNOSIS — Z86711 Personal history of pulmonary embolism: Secondary | ICD-10-CM | POA: Diagnosis not present

## 2020-01-17 LAB — HEPARIN ANTI-XA: Heparin LMW: 0.54 IU/mL

## 2020-02-05 ENCOUNTER — Other Ambulatory Visit (HOSPITAL_COMMUNITY): Payer: Self-pay | Admitting: Obstetrics and Gynecology

## 2020-02-05 ENCOUNTER — Other Ambulatory Visit: Payer: Self-pay | Admitting: Obstetrics and Gynecology

## 2020-02-05 DIAGNOSIS — R1013 Epigastric pain: Secondary | ICD-10-CM

## 2020-02-13 ENCOUNTER — Ambulatory Visit (HOSPITAL_COMMUNITY)
Admission: RE | Admit: 2020-02-13 | Discharge: 2020-02-13 | Disposition: A | Discharge status: Home / Self Care | Payer: 59 | Source: Ambulatory Visit | Attending: Obstetrics and Gynecology | Admitting: Obstetrics and Gynecology

## 2020-02-13 ENCOUNTER — Other Ambulatory Visit: Payer: Self-pay

## 2020-02-13 DIAGNOSIS — R1013 Epigastric pain: Secondary | ICD-10-CM | POA: Diagnosis not present

## 2020-03-14 ENCOUNTER — Ambulatory Visit: Payer: Self-pay | Admitting: Surgery

## 2020-03-17 ENCOUNTER — Ambulatory Visit: Payer: Self-pay | Admitting: Surgery

## 2020-03-24 NOTE — Progress Notes (Signed)
[place], [place] [address] Phone: [phone] Fax: [phone]  [facility] [address] [place] [address] Phone: [phone] Fax: [phone]  [facility] [place] [address] Phone: [phone] Fax: [place], [place] - [place] [address] Phone: [phone] Fax: [phone]  [place], [place] [facility] [address] [place] [address] Phone: [phone] Fax: [phone]      Your procedure is scheduled on 03/31/20.  Report to [facility] "A" at 12:30 P.M., and check in at the Admitting office.  Call this number if you have problems the morning of surgery:  [phone]  Call [phone] if you have any questions prior to your surgery date Monday-Friday 8am-4pm    Remember:  Do not eat or drink after midnight the night before your surgery    Take these medicines the morning of surgery with A SIP OF WATER: cetirizine (ZYRTEC)  fluticasone (FLONASE) - as needed  As of today, STOP taking any Aspirin (unless otherwise instructed by your surgeon) Aleve, Naproxen, Ibuprofen, Motrin, Advil, Goody's, BC's, all herbal medications, fish oil, and all vitamins.                      Do not wear jewelry, make up, or nail polish            Do not wear lotions, powders, perfumes or deodorant.            Do not shave 48 hours prior to surgery.              Do not bring valuables to the hospital.            [facility] is not responsible for any belongings or valuables.  Do NOT Smoke (Tobacco/Vaping) or drink Alcohol 24 hours prior to your procedure If you use a CPAP at night, you may  bring all equipment for your overnight stay.   Contacts, glasses, dentures or bridgework may not be worn into surgery.      For patients admitted to the hospital, discharge time will be determined by your treatment team.   Patients discharged the day of surgery will not be allowed to drive home, and someone needs to stay with them for 24 hours.    Special instructions:   [place]- Preparing For Surgery  Before surgery, you can play an important role. Because skin is not sterile, your skin needs to be as free of germs as possible. You can reduce the number of germs on your skin by washing with CHG (chlorahexidine gluconate) Soap before surgery.  CHG is an antiseptic cleaner which kills germs and bonds with the skin to continue killing germs even after washing.    Oral Hygiene is also important to reduce your risk of infection.  Remember - BRUSH YOUR TEETH THE MORNING OF SURGERY WITH YOUR REGULAR TOOTHPASTE  Please do not use if you have an allergy to CHG or antibacterial soaps. If your skin becomes reddened/irritated stop using the CHG.  Do not shave (including legs and underarms) for at least 48 hours prior to first CHG shower. It is OK to  shave your face.  Please follow these instructions carefully.   1. Shower the NIGHT BEFORE SURGERY and the MORNING OF SURGERY with CHG Soap.   2. If you chose to wash your hair, wash your hair first as usual with your normal shampoo.  3. After you shampoo, rinse your hair and body thoroughly to remove the shampoo.  4. Use CHG as you would any other liquid soap. You can apply CHG directly to the skin and wash gently with a scrungie or a clean washcloth.   5. Apply the CHG Soap to your body ONLY FROM THE NECK DOWN.  Do not use on open wounds or open sores. Avoid contact with your eyes, ears, mouth and genitals (private parts). Wash Face and genitals (private parts)  with your normal soap.   6. Wash thoroughly, paying special attention to the area  where your surgery will be performed.  7. Thoroughly rinse your body with warm water from the neck down.  8. DO NOT shower/wash with your normal soap after using and rinsing off the CHG Soap.  9. Pat yourself dry with a CLEAN TOWEL.  10. Wear CLEAN PAJAMAS to bed the night before surgery  11. Place CLEAN SHEETS on your bed the night of your first shower and DO NOT SLEEP WITH PETS.   Day of Surgery: Wear Clean/Comfortable clothing the morning of surgery Do not apply any deodorants/lotions.   Remember to brush your teeth WITH YOUR REGULAR TOOTHPASTE.   Please read over the following fact sheets that you were given.

## 2020-03-25 ENCOUNTER — Other Ambulatory Visit: Payer: Self-pay

## 2020-03-25 ENCOUNTER — Encounter (HOSPITAL_COMMUNITY)
Admission: RE | Admit: 2020-03-25 | Discharge: 2020-03-25 | Disposition: A | Discharge status: Home / Self Care | Payer: 59 | Source: Ambulatory Visit | Attending: Surgery | Admitting: Surgery

## 2020-03-25 ENCOUNTER — Encounter (HOSPITAL_COMMUNITY): Payer: Self-pay

## 2020-03-25 DIAGNOSIS — Z01812 Encounter for preprocedural laboratory examination: Secondary | ICD-10-CM | POA: Diagnosis present

## 2020-03-25 LAB — CBC
HCT: 43 % (ref 36.0–46.0)
Hemoglobin: 13.2 g/dL (ref 12.0–15.0)
MCH: 25.5 pg — ABNORMAL LOW (ref 26.0–34.0)
MCHC: 30.7 g/dL (ref 30.0–36.0)
MCV: 83 fL (ref 80.0–100.0)
Platelets: 308 10*3/uL (ref 150–400)
RBC: 5.18 MIL/uL — ABNORMAL HIGH (ref 3.87–5.11)
RDW: 15.6 % — ABNORMAL HIGH (ref 11.5–15.5)
WBC: 9.3 10*3/uL (ref 4.0–10.5)
nRBC: 0 % (ref 0.0–0.2)

## 2020-03-25 NOTE — Progress Notes (Signed)
PCP - Was seeing someone at [place] at [place] - Denies  Chest x-ray - 02/22/07 EKG -  Stress Test - Denies ECHO - Denies Cardiac Cath - Denies  Sleep Study - Denies  DM - Denies   COVID TEST- 03/27/20  Anesthesia review: No  Patient denies shortness of breath, fever, cough and chest pain at PAT appointment   All instructions explained to the patient, with a verbal understanding of the material. Patient agrees to go over the instructions while at home for a better understanding. Patient also instructed to self quarantine after being tested for COVID-19. The opportunity to ask questions was provided.

## 2020-03-27 ENCOUNTER — Other Ambulatory Visit (HOSPITAL_COMMUNITY)
Admission: RE | Admit: 2020-03-27 | Discharge: 2020-03-27 | Disposition: A | Discharge status: Home / Self Care | Payer: 59 | Source: Ambulatory Visit | Attending: Surgery | Admitting: Surgery

## 2020-03-27 DIAGNOSIS — Z01812 Encounter for preprocedural laboratory examination: Secondary | ICD-10-CM | POA: Diagnosis not present

## 2020-03-27 DIAGNOSIS — Z20822 Contact with and (suspected) exposure to covid-19: Secondary | ICD-10-CM | POA: Insufficient documentation

## 2020-03-27 LAB — SARS CORONAVIRUS 2 (TAT 6-24 HRS): SARS Coronavirus 2: NEGATIVE

## 2020-03-31 ENCOUNTER — Encounter (HOSPITAL_COMMUNITY)
Admission: RE | Disposition: A | Discharge status: Home / Self Care | Payer: Self-pay | Source: Home / Self Care | Attending: Surgery

## 2020-03-31 ENCOUNTER — Ambulatory Visit (HOSPITAL_COMMUNITY)
Admission: RE | Admit: 2020-03-31 | Discharge: 2020-03-31 | Disposition: A | Discharge status: Home / Self Care | Payer: 59 | Attending: Surgery | Admitting: Surgery

## 2020-03-31 ENCOUNTER — Other Ambulatory Visit: Payer: Self-pay

## 2020-03-31 ENCOUNTER — Ambulatory Visit (HOSPITAL_COMMUNITY): Payer: 59 | Admitting: Certified Registered Nurse Anesthetist

## 2020-03-31 ENCOUNTER — Encounter (HOSPITAL_COMMUNITY): Payer: Self-pay | Admitting: Surgery

## 2020-03-31 DIAGNOSIS — Z975 Presence of (intrauterine) contraceptive device: Secondary | ICD-10-CM | POA: Diagnosis not present

## 2020-03-31 DIAGNOSIS — K8010 Calculus of gallbladder with chronic cholecystitis without obstruction: Secondary | ICD-10-CM | POA: Insufficient documentation

## 2020-03-31 DIAGNOSIS — Z79899 Other long term (current) drug therapy: Secondary | ICD-10-CM | POA: Insufficient documentation

## 2020-03-31 DIAGNOSIS — D6859 Other primary thrombophilia: Secondary | ICD-10-CM | POA: Diagnosis not present

## 2020-03-31 DIAGNOSIS — Z86718 Personal history of other venous thrombosis and embolism: Secondary | ICD-10-CM | POA: Diagnosis not present

## 2020-03-31 DIAGNOSIS — Z793 Long term (current) use of hormonal contraceptives: Secondary | ICD-10-CM | POA: Diagnosis not present

## 2020-03-31 DIAGNOSIS — Z86711 Personal history of pulmonary embolism: Secondary | ICD-10-CM | POA: Insufficient documentation

## 2020-03-31 DIAGNOSIS — K8020 Calculus of gallbladder without cholecystitis without obstruction: Secondary | ICD-10-CM | POA: Diagnosis present

## 2020-03-31 HISTORY — PX: CHOLECYSTECTOMY: SHX55

## 2020-03-31 LAB — POCT PREGNANCY, URINE: Preg Test, Ur: NEGATIVE

## 2020-03-31 SURGERY — LAPAROSCOPIC CHOLECYSTECTOMY
Anesthesia: General | Site: Abdomen

## 2020-03-31 MED ORDER — OXYCODONE HCL 5 MG/5ML PO SOLN: 5.0000 mg | Freq: Once | ORAL | Status: DC | PRN

## 2020-03-31 MED ORDER — FENTANYL CITRATE (PF) 250 MCG/5ML IJ SOLN
INTRAMUSCULAR | Status: AC
  Filled 2020-03-31: qty 5

## 2020-03-31 MED ORDER — FENTANYL CITRATE (PF) 100 MCG/2ML IJ SOLN
25.0000 ug | INTRAMUSCULAR | Status: DC | PRN
  Administered 2020-03-31 (×2): 25 ug via INTRAVENOUS

## 2020-03-31 MED ORDER — ROCURONIUM BROMIDE 10 MG/ML (PF) SYRINGE
PREFILLED_SYRINGE | INTRAVENOUS | Status: AC
  Filled 2020-03-31: qty 10

## 2020-03-31 MED ORDER — CHLORHEXIDINE GLUCONATE CLOTH 2 % EX PADS: 6.0000 | MEDICATED_PAD | Freq: Once | CUTANEOUS | Status: DC

## 2020-03-31 MED ORDER — 0.9 % SODIUM CHLORIDE (POUR BTL) OPTIME
TOPICAL | Status: DC | PRN
  Administered 2020-03-31: 1000 mL

## 2020-03-31 MED ORDER — PROPOFOL 10 MG/ML IV BOLUS
INTRAVENOUS | Status: AC
  Filled 2020-03-31: qty 20

## 2020-03-31 MED ORDER — BUPIVACAINE HCL (PF) 0.25 % IJ SOLN
INTRAMUSCULAR | Status: AC
  Filled 2020-03-31: qty 30

## 2020-03-31 MED ORDER — MIDAZOLAM HCL 2 MG/2ML IJ SOLN
INTRAMUSCULAR | Status: AC
  Filled 2020-03-31: qty 2

## 2020-03-31 MED ORDER — DEXAMETHASONE SODIUM PHOSPHATE 10 MG/ML IJ SOLN
INTRAMUSCULAR | Status: DC | PRN
  Administered 2020-03-31: 5 mg via INTRAVENOUS

## 2020-03-31 MED ORDER — ONDANSETRON HCL 4 MG/2ML IJ SOLN
INTRAMUSCULAR | Status: AC
  Filled 2020-03-31: qty 2

## 2020-03-31 MED ORDER — LACTATED RINGERS IV SOLN: INTRAVENOUS | Status: DC

## 2020-03-31 MED ORDER — SODIUM CHLORIDE 0.9 % IR SOLN
Status: DC | PRN
  Administered 2020-03-31: 1000 mL

## 2020-03-31 MED ORDER — FENTANYL CITRATE (PF) 250 MCG/5ML IJ SOLN
INTRAMUSCULAR | Status: DC | PRN
  Administered 2020-03-31 (×2): 50 ug via INTRAVENOUS
  Administered 2020-03-31: 100 ug via INTRAVENOUS

## 2020-03-31 MED ORDER — LIDOCAINE 2% (20 MG/ML) 5 ML SYRINGE
INTRAMUSCULAR | Status: DC | PRN
  Administered 2020-03-31: 60 mg via INTRAVENOUS

## 2020-03-31 MED ORDER — PROPOFOL 10 MG/ML IV BOLUS
INTRAVENOUS | Status: DC | PRN
  Administered 2020-03-31: 200 mg via INTRAVENOUS

## 2020-03-31 MED ORDER — ONDANSETRON HCL 4 MG/2ML IJ SOLN
INTRAMUSCULAR | Status: DC | PRN
  Administered 2020-03-31: 4 mg via INTRAVENOUS

## 2020-03-31 MED ORDER — CEFAZOLIN SODIUM-DEXTROSE 2-4 GM/100ML-% IV SOLN
INTRAVENOUS | Status: AC
  Filled 2020-03-31: qty 100

## 2020-03-31 MED ORDER — CHLORHEXIDINE GLUCONATE 0.12 % MT SOLN
OROMUCOSAL | Status: AC
  Administered 2020-03-31: 15 mL via OROMUCOSAL
  Filled 2020-03-31: qty 15

## 2020-03-31 MED ORDER — CHLORHEXIDINE GLUCONATE 0.12 % MT SOLN: 15.0000 mL | Freq: Once | OROMUCOSAL | Status: AC

## 2020-03-31 MED ORDER — ONDANSETRON HCL 4 MG/2ML IJ SOLN
4.0000 mg | Freq: Once | INTRAMUSCULAR | Status: AC | PRN
  Administered 2020-03-31: 4 mg via INTRAVENOUS

## 2020-03-31 MED ORDER — FENTANYL CITRATE (PF) 100 MCG/2ML IJ SOLN
INTRAMUSCULAR | Status: AC
  Filled 2020-03-31: qty 2

## 2020-03-31 MED ORDER — DEXMEDETOMIDINE (PRECEDEX) IN NS 20 MCG/5ML (4 MCG/ML) IV SYRINGE
PREFILLED_SYRINGE | INTRAVENOUS | Status: DC | PRN
  Administered 2020-03-31: 8 ug via INTRAVENOUS
  Administered 2020-03-31: 4 ug via INTRAVENOUS

## 2020-03-31 MED ORDER — SUGAMMADEX SODIUM 200 MG/2ML IV SOLN
INTRAVENOUS | Status: DC | PRN
  Administered 2020-03-31: 200 mg via INTRAVENOUS

## 2020-03-31 MED ORDER — OXYCODONE HCL 5 MG PO TABS: 5.0000 mg | ORAL_TABLET | Freq: Once | ORAL | Status: DC | PRN

## 2020-03-31 MED ORDER — LIDOCAINE 2% (20 MG/ML) 5 ML SYRINGE
INTRAMUSCULAR | Status: AC
  Filled 2020-03-31: qty 5

## 2020-03-31 MED ORDER — CEFAZOLIN SODIUM-DEXTROSE 2-4 GM/100ML-% IV SOLN
2.0000 g | INTRAVENOUS | Status: AC
  Administered 2020-03-31: 2 g via INTRAVENOUS

## 2020-03-31 MED ORDER — FENTANYL CITRATE (PF) 250 MCG/5ML IJ SOLN: INTRAMUSCULAR | Status: DC | PRN

## 2020-03-31 MED ORDER — ROCURONIUM BROMIDE 10 MG/ML (PF) SYRINGE
PREFILLED_SYRINGE | INTRAVENOUS | Status: DC | PRN
  Administered 2020-03-31: 40 mg via INTRAVENOUS
  Administered 2020-03-31: 10 mg via INTRAVENOUS

## 2020-03-31 MED ORDER — MIDAZOLAM HCL 5 MG/5ML IJ SOLN
INTRAMUSCULAR | Status: DC | PRN
  Administered 2020-03-31: 2 mg via INTRAVENOUS

## 2020-03-31 MED ORDER — AMISULPRIDE (ANTIEMETIC) 5 MG/2ML IV SOLN: 10.0000 mg | Freq: Once | INTRAVENOUS | Status: DC | PRN

## 2020-03-31 MED ORDER — BUPIVACAINE-EPINEPHRINE 0.25% -1:200000 IJ SOLN
INTRAMUSCULAR | Status: DC | PRN
  Administered 2020-03-31: 27 mL

## 2020-03-31 MED ORDER — ORAL CARE MOUTH RINSE: 15.0000 mL | Freq: Once | OROMUCOSAL | Status: AC

## 2020-03-31 MED ORDER — OXYCODONE HCL 5 MG PO TABS: 5.0000 mg | ORAL_TABLET | Freq: Four times a day (QID) | ORAL | 0 refills | Status: DC | PRN

## 2020-03-31 MED ORDER — DEXMEDETOMIDINE (PRECEDEX) IN NS 20 MCG/5ML (4 MCG/ML) IV SYRINGE
PREFILLED_SYRINGE | INTRAVENOUS | Status: AC
  Filled 2020-03-31: qty 5

## 2020-03-31 SURGICAL SUPPLY — 44 items
ADH SKN CLS APL DERMABOND .7 (GAUZE/BANDAGES/DRESSINGS) ×1
APL PRP STRL LF DISP 70% ISPRP (MISCELLANEOUS) ×1
APPLIER CLIP 5 13 M/L LIGAMAX5 (MISCELLANEOUS) ×3
APR CLP MED LRG 5 ANG JAW (MISCELLANEOUS) ×1
BAG SPEC RTRVL LRG 6X4 10 (ENDOMECHANICALS) ×1
CANISTER SUCT 3000ML PPV (MISCELLANEOUS) ×3 IMPLANT
CHLORAPREP W/TINT 26 (MISCELLANEOUS) ×3 IMPLANT
CLIP APPLIE 5 13 M/L LIGAMAX5 (MISCELLANEOUS) ×1 IMPLANT
COVER SURGICAL LIGHT HANDLE (MISCELLANEOUS) ×3 IMPLANT
COVER WAND RF STERILE (DRAPES) ×3 IMPLANT
DERMABOND ADVANCED (GAUZE/BANDAGES/DRESSINGS) ×2
DERMABOND ADVANCED .7 DNX12 (GAUZE/BANDAGES/DRESSINGS) ×1 IMPLANT
ELECT REM PT RETURN 9FT ADLT (ELECTROSURGICAL) ×3
ELECTRODE REM PT RTRN 9FT ADLT (ELECTROSURGICAL) ×1 IMPLANT
GLOVE BIOGEL PI IND STRL 6 (GLOVE) ×1 IMPLANT
GLOVE BIOGEL PI IND STRL 6.5 (GLOVE) IMPLANT
GLOVE BIOGEL PI INDICATOR 6 (GLOVE) ×2
GLOVE BIOGEL PI INDICATOR 6.5 (GLOVE) ×4
GLOVE BIOGEL PI MICRO 5.5 (GLOVE) ×2
GLOVE BIOGEL PI MICRO STRL 5.5 (GLOVE) ×1 IMPLANT
GLOVE ECLIPSE 6.5 STRL STRAW (GLOVE) ×2 IMPLANT
GOWN STRL REUS W/ TWL LRG LVL3 (GOWN DISPOSABLE) ×3 IMPLANT
GOWN STRL REUS W/TWL LRG LVL3 (GOWN DISPOSABLE) ×9
KIT BASIN OR (CUSTOM PROCEDURE TRAY) ×3 IMPLANT
KIT TURNOVER KIT B (KITS) ×3 IMPLANT
L-HOOK LAP DISP 36CM (ELECTROSURGICAL) ×3
LHOOK LAP DISP 36CM (ELECTROSURGICAL) IMPLANT
NS IRRIG 1000ML POUR BTL (IV SOLUTION) ×3 IMPLANT
PAD ARMBOARD 7.5X6 YLW CONV (MISCELLANEOUS) ×3 IMPLANT
PENCIL BUTTON HOLSTER BLD 10FT (ELECTRODE) ×2 IMPLANT
POUCH SPECIMEN RETRIEVAL 10MM (ENDOMECHANICALS) ×2 IMPLANT
SCISSORS LAP 5X35 DISP (ENDOMECHANICALS) ×3 IMPLANT
SET IRRIG TUBING LAPAROSCOPIC (IRRIGATION / IRRIGATOR) ×3 IMPLANT
SET TUBE SMOKE EVAC HIGH FLOW (TUBING) ×3 IMPLANT
SLEEVE ENDOPATH XCEL 5M (ENDOMECHANICALS) ×6 IMPLANT
SPECIMEN JAR SMALL (MISCELLANEOUS) ×3 IMPLANT
SUT MNCRL AB 4-0 PS2 18 (SUTURE) ×3 IMPLANT
SUT VICRYL 0 UR6 27IN ABS (SUTURE) ×3 IMPLANT
TOWEL GREEN STERILE (TOWEL DISPOSABLE) ×3 IMPLANT
TOWEL GREEN STERILE FF (TOWEL DISPOSABLE) ×3 IMPLANT
TRAY LAPAROSCOPIC MC (CUSTOM PROCEDURE TRAY) ×3 IMPLANT
TROCAR XCEL BLUNT TIP 100MML (ENDOMECHANICALS) ×3 IMPLANT
TROCAR XCEL NON-BLD 5MMX100MML (ENDOMECHANICALS) ×3 IMPLANT
WATER STERILE IRR 1000ML POUR (IV SOLUTION) ×3 IMPLANT

## 2020-03-31 NOTE — Anesthesia Procedure Notes (Signed)
Procedure Name: Intubation Date/Time: 03/31/2020 2:41 PM Performed by: Terrence Dupont, RN Pre-anesthesia Checklist: Patient identified, Emergency Drugs available, Suction available and Patient being monitored Patient Re-evaluated:Patient Re-evaluated prior to induction Oxygen Delivery Method: Circle system utilized Preoxygenation: Pre-oxygenation with 100% oxygen Induction Type: IV induction Ventilation: Mask ventilation without difficulty Laryngoscope Size: Mac and 3 Grade View: Grade I Tube type: Oral Tube size: 7.0 mm Number of attempts: 1 Airway Equipment and Method: Stylet Placement Confirmation: ETT inserted through vocal cords under direct vision,  positive ETCO2 and breath sounds checked- equal and bilateral Secured at: 21 cm Tube secured with: Tape Dental Injury: Teeth and Oropharynx as per pre-operative assessment

## 2020-03-31 NOTE — Anesthesia Preprocedure Evaluation (Signed)
Anesthesia Evaluation  Patient identified by MRN, date of birth, ID band Patient awake    Reviewed: Allergy & Precautions, NPO status , Patient's Chart, lab work & pertinent test results  History of Anesthesia Complications Negative for: history of anesthetic complications  Airway Mallampati: II  TM Distance: >3 FB Neck ROM: Full    Dental   Pulmonary PE   Pulmonary exam normal        Cardiovascular + DVT  Normal cardiovascular exam     Neuro/Psych Anxiety Depression negative neurological ROS     GI/Hepatic negative GI ROS, Neg liver ROS,   Endo/Other  negative endocrine ROS  Renal/GU negative Renal ROS  negative genitourinary   Musculoskeletal negative musculoskeletal ROS (+)   Abdominal   Peds  Hematology Antithrombin deficiency with h/o DVT/PE (2008)   Anesthesia Other Findings   Reproductive/Obstetrics                             Anesthesia Physical Anesthesia Plan  ASA: II  Anesthesia Plan: General   Post-op Pain Management:    Induction: Intravenous  PONV Risk Score and Plan: 4 or greater and Ondansetron, Dexamethasone, Treatment may vary due to age or medical condition and Midazolam  Airway Management Planned: Oral ETT  Additional Equipment: None  Intra-op Plan:   Post-operative Plan: Extubation in OR  Informed Consent: I have reviewed the patients History and Physical, chart, labs and discussed the procedure including the risks, benefits and alternatives for the proposed anesthesia with the patient or authorized representative who has indicated his/her understanding and acceptance.     Dental advisory given  Plan Discussed with:   Anesthesia Plan Comments:         Anesthesia Quick Evaluation

## 2020-03-31 NOTE — Anesthesia Postprocedure Evaluation (Signed)
Anesthesia Post Note  Patient: Sherry Ray  Procedure(s) Performed: LAPAROSCOPIC CHOLECYSTECTOMY (N/A Abdomen)     Patient location during evaluation: PACU Anesthesia Type: General Level of consciousness: awake and alert Pain management: pain level controlled Vital Signs Assessment: post-procedure vital signs reviewed and stable Respiratory status: spontaneous breathing, nonlabored ventilation and respiratory function stable Cardiovascular status: blood pressure returned to baseline and stable Postop Assessment: no apparent nausea or vomiting Anesthetic complications: no   No complications documented.  Last Vitals:  Vitals:   03/31/20 1645 03/31/20 1655  BP: 117/73 112/73  Pulse: 70 68  Resp: 20 13  Temp:  36.5 C  SpO2: 99% 98%    Last Pain:  Vitals:   03/31/20 1655  TempSrc:   PainSc: 2                  Lidia Collum

## 2020-03-31 NOTE — Op Note (Signed)
Date: 03/31/20  Patient: Sherry Ray MRN: [id number]  Preoperative Diagnosis: Symptomatic cholelithiasis Postoperative Diagnosis: Same  Procedure: Laparoscopic cholecystectomy  Surgeon: Michaelle Birks, MD Assistant: Eliot Ford, MD PGY-3  EBL: Minimal  Anesthesia: General  Specimens: Gallbladder  Indications: Ms. Barrasso is a 39 yo female who developed RUQ pain during her pregnancy. An [place] confirmed cholelithiasis. After an extensive discussion of the risks and benefits of surgery, she agreed to proceed with elective cholecystectomy.  Findings: Cholelithiasis, no evidence of cholecystitis.  Procedure details: Informed consent was obtained in the preoperative area prior to the procedure. The patient was brought to the operating room and placed on the table in the supine position. General anesthesia was induced and appropriate lines and drains were placed for intraoperative monitoring. Perioperative antibiotics were administered per SCIP guidelines. The abdomen was prepped and draped in the usual sterile fashion. A pre-procedure timeout was taken verifying patient identity, surgical site and procedure to be performed.  A small infraumbilical skin incision was made, the subcutaneous tissue was divided with cautery, and the umbilical stalk was grasped and elevated. The fascia was incised and the peritoneal cavity was directly visualized. A 73mm Hassan trocar was placed. The peritoneal cavity was inspected with no evidence of visceral or vascular injury. Three 49mm ports were placed in the right subcostal margin, all under direct visualization. The fundus of the gallbladder was grasped and retracted cephalad. The infundibulum was retracted laterally. The cystic triangle was dissected out using cautery and blunt dissection, and the critical view of safety was obtained. The cystic duct and cystic artery were clipped and ligated, leaving two clips behind on the cystic duct and artery stumps. The  gallbladder was taken off the liver using cautery. The specimen was placed in an endocatch bag and removed. The surgical site was irrigated with saline until the effluent was clear. Hemostasis was achieved in the gallbladder fossa using cautery. The cystic duct and artery stumps were visually inspected and there was no evidence of bile leak or bleeding. The ports were removed under direct visualization and the abdomen was desufflated. The umbilical port site fascia was closed with a 0 vicryl suture. The skin at all port sites was closed with 4-0 monocryl subcuticular suture. Dermabond was applied.  All counts were correct x2 at the end of the procedure. The patient was extubated and taken to PACU in stable condition.  Michaelle Birks, MD 03/31/20 3:53 PM

## 2020-03-31 NOTE — H&P (Signed)
Sherry Ray is an 39 y.o. female.   Chief Complaint: RUQ pain HPI: Sherry Ray is a 39 yo female who developed RUQ pain with radiation to the back during pregnancy. She delivered in July of this year. A RUQ [place] confirmed gallstones. No fevers or jaundice associated with her symptoms. She presents today for surgery. Since her clinic visit on 9/27, she recently had a skin lesion excised from her abdomen, but denies any other changes in her medical history.   Past Medical History:  Diagnosis Date  . Antithrombin deficiency ([place])    had a PE (2008) and DVT (1996), took coumadin for 1 year  . Anxiety and depression   . Depression   . DVT (deep venous thrombosis) ([place])   . Headache   . History of cholestasis during pregnancy   . Pulmonary embolism ([place])   . Vaginal Pap smear, abnormal     Past Surgical History:  Procedure Laterality Date  . CESAREAN SECTION    . CESAREAN SECTION N/A 12/31/2019   Procedure: CESAREAN SECTION;  Surgeon: Everlene Farrier, MD;  Location: [facility];  Service: Obstetrics;  Laterality: N/A;  Heather, RNFA  . ESOPHAGOGASTRODUODENOSCOPY ENDOSCOPY    . FIRST RIB REMOVAL    . LEEP    . NASAL SINUS SURGERY      History reviewed. No pertinent family history. Social History:  reports that she has never smoked. She has never used smokeless tobacco. She reports that she does not drink alcohol and does not use drugs.  Allergies: No Known Allergies  Medications Prior to Admission  Medication Sig Dispense Refill  . cetirizine (ZYRTEC) 10 MG tablet Take 10 mg by mouth daily.     . fluticasone (FLONASE) 50 MCG/ACT nasal spray Place 2 sprays into both nostrils daily as needed for allergies or rhinitis.    Marland Kitchen ibuprofen (ADVIL) 200 MG tablet Take 400 mg by mouth every 6 (six) hours as needed for moderate pain.    Marland Kitchen levonorgestrel (MIRENA) 20 MCG/24HR IUD 1 each by Intrauterine route once.    . docusate sodium (COLACE) 100 MG capsule Take 100 mg by mouth daily. (Patient not  taking: Reported on 03/14/2020)    . enoxaparin (LOVENOX) 150 MG/ML injection Inject 0.66 mLs (99 mg total) into the skin every 12 (twelve) hours. Please provide 70 doses of Lovenox (Patient not taking: Reported on 03/14/2020) 70 mL 0  . ibuprofen (ADVIL) 600 MG tablet Take 1 tablet (600 mg total) by mouth every 6 (six) hours as needed for mild pain, moderate pain or cramping. (Patient not taking: Reported on 03/14/2020) 30 tablet 0  . oxyCODONE (OXY IR/ROXICODONE) 5 MG immediate release tablet Take 1-2 tablets (5-10 mg total) by mouth every 4 (four) hours as needed for moderate pain. (Patient not taking: Reported on 03/14/2020) 30 tablet 0  . Prenatal Vit-Fe Fumarate-FA (PRENATAL VITAMINS PO) Take 1 tablet by mouth daily.  (Patient not taking: Reported on 03/14/2020)      Results for orders placed or performed during the hospital encounter of 03/31/20 (from the past 48 hour(s))  Pregnancy, urine POC     Status: None   Collection Time: 03/31/20  1:19 PM  Result Value Ref Range   Preg Test, Ur NEGATIVE NEGATIVE    Comment:        THE SENSITIVITY OF THIS METHODOLOGY IS >24 mIU/mL    No results found.  Review of Systems  Constitutional: Negative for chills and fever.  HENT: Negative for voice change.  Respiratory: Negative for cough and shortness of breath.   Gastrointestinal: Negative for abdominal pain, nausea and vomiting.  Genitourinary: Negative for difficulty urinating.  Musculoskeletal: Negative for gait problem.  Skin:       Recent excision of skin lesion  Allergic/Immunologic: Negative for immunocompromised state.  Neurological: Negative for facial asymmetry and speech difficulty.  Psychiatric/Behavioral: Negative for agitation and confusion.    Blood pressure 114/64, pulse 80, temperature 97.8 F (36.6 C), temperature source Temporal, resp. rate 18, height 5\' 4"  (1.626 m), weight 70.8 kg, SpO2 97 %, unknown if currently breastfeeding. Physical Exam Constitutional:       General: She is not in acute distress.    Appearance: Normal appearance.  HENT:     Head: Normocephalic and atraumatic.     Nose: Nose normal.  Eyes:     General: No scleral icterus.    Extraocular Movements: Extraocular movements intact.  Pulmonary:     Effort: Pulmonary effort is normal. No respiratory distress.  Abdominal:     General: Abdomen is flat. There is no distension.     Palpations: Abdomen is soft.     Comments: Clean bandaid in place on upper abdomen to the left of midline  Musculoskeletal:        General: No swelling. Normal range of motion.     Cervical back: Normal range of motion.  Skin:    General: Skin is warm and dry.     Coloration: Skin is not jaundiced.  Neurological:     General: No focal deficit present.     Mental Status: She is alert.  Psychiatric:        Mood and Affect: Mood normal.        Behavior: Behavior normal.        Thought Content: Thought content normal.      Assessment/Plan 39 yo female with symptomatic cholelithiasis. Proceed to OR for laparoscopic cholecystectomy. Plan for discharge home from PACU. Surgical consent completed, risks and benefits discussed, all questions answered.  Dwan Bolt, MD 03/31/2020, 2:05 PM

## 2020-03-31 NOTE — Transfer of Care (Signed)
Immediate Anesthesia Transfer of Care Note  Patient: Sherry Ray  Procedure(s) Performed: LAPAROSCOPIC CHOLECYSTECTOMY (N/A Abdomen)  Patient Location: PACU  Anesthesia Type:General  Level of Consciousness: drowsy  Airway & Oxygen Therapy: Patient Spontanous Breathing and Patient connected to nasal cannula oxygen  Post-op Assessment: Report given to RN and Post -op Vital signs reviewed and stable  Post vital signs: Reviewed and stable  Last Vitals:  Vitals Value Taken Time  BP 138/77 03/31/20 1556  Temp    Pulse 80 03/31/20 1557  Resp 18 03/31/20 1557  SpO2 100 % 03/31/20 1557  Vitals shown include unvalidated device data.  Last Pain:  Vitals:   03/31/20 1315  TempSrc:   PainSc: 0-No pain      Patients Stated Pain Goal: 3 (88/89/16 9450)  Complications: No complications documented.

## 2020-03-31 NOTE — Discharge Instructions (Signed)
SURGERY DISCHARGE INSTRUCTIONS  Activity . No heavy lifting greater than 10 pounds for 4 weeks after surgery. Madaline Brilliant to shower, but do not bathe or submerge incisions underwater. . Do not drive while taking narcotic pain medication.  Wound Care . Your incisions are covered with skin glue called Dermabond. This will peel off on its own over time. . You may shower and allow warm soapy water to run over your incisions. Gently pat dry. . Do not submerge your incision underwater. . Monitor your incision for any new redness, tenderness, or drainage.  When to Call us: Marland Kitchen Fever greater than 100.5 . New redness, drainage, or swelling at incision site . Severe pain, nausea, or vomiting . Jaundice (yellowing of the whites of the eyes or skin)  Follow-up You have an appointment scheduled with Dr. Zenia Resides on April 21, 2020 at 9:30am. This will be at the [facility] office at [address] [place], [place], [place]. Please arrive at least 15 minutes prior to your scheduled appointment time.  For questions or concerns, please call the office at [phone].

## 2020-04-01 ENCOUNTER — Encounter (HOSPITAL_COMMUNITY): Payer: Self-pay | Admitting: Surgery

## 2020-04-02 LAB — SURGICAL PATHOLOGY

## 2020-04-30 HISTORY — PX: EYE SURGERY: SHX253

## 2020-07-02 ENCOUNTER — Encounter: Payer: Self-pay | Admitting: Family

## 2020-07-02 ENCOUNTER — Ambulatory Visit: Payer: 59 | Admitting: Family

## 2020-07-02 ENCOUNTER — Other Ambulatory Visit: Payer: Self-pay | Admitting: Family

## 2020-07-02 ENCOUNTER — Ambulatory Visit (INDEPENDENT_AMBULATORY_CARE_PROVIDER_SITE_OTHER): Payer: 59 | Admitting: Family

## 2020-07-02 ENCOUNTER — Other Ambulatory Visit: Payer: Self-pay

## 2020-07-02 VITALS — BP 118/80 | HR 88 | Temp 97.9°F | Ht 64.0 in | Wt 151.0 lb

## 2020-07-02 DIAGNOSIS — R5383 Other fatigue: Secondary | ICD-10-CM

## 2020-07-02 DIAGNOSIS — R7989 Other specified abnormal findings of blood chemistry: Secondary | ICD-10-CM

## 2020-07-02 LAB — COMPREHENSIVE METABOLIC PANEL
ALT: 30 U/L (ref 0–35)
AST: 20 U/L (ref 0–37)
Albumin: 4.7 g/dL (ref 3.5–5.2)
Alkaline Phosphatase: 114 U/L (ref 39–117)
BUN: 19 mg/dL (ref 6–23)
CO2: 26 mEq/L (ref 19–32)
Calcium: 9.3 mg/dL (ref 8.4–10.5)
Chloride: 103 mEq/L (ref 96–112)
Creatinine, Ser: 0.66 mg/dL (ref 0.40–1.20)
GFR: 110.34 mL/min (ref >60.00)
Glucose, Bld: 85 mg/dL (ref 70–99)
Potassium: 3.9 mEq/L (ref 3.5–5.1)
Sodium: 138 mEq/L (ref 135–145)
Total Bilirubin: 0.3 mg/dL (ref 0.2–1.2)
Total Protein: 7.7 g/dL (ref 6.0–8.3)

## 2020-07-02 LAB — CBC WITH DIFFERENTIAL/PLATELET
Basophils Absolute: 0 10*3/uL (ref 0.0–0.1)
Basophils Relative: 0.4 % (ref 0.0–3.0)
Eosinophils Absolute: 0.2 10*3/uL (ref 0.0–0.7)
Eosinophils Relative: 2.7 % (ref 0.0–5.0)
HCT: 40 % (ref 36.0–46.0)
Hemoglobin: 13.2 g/dL (ref 12.0–15.0)
Lymphocytes Relative: 46.3 % — ABNORMAL HIGH (ref 12.0–46.0)
Lymphs Abs: 3.8 10*3/uL (ref 0.7–4.0)
MCHC: 33.1 g/dL (ref 30.0–36.0)
MCV: 81.7 fl (ref 78.0–100.0)
Monocytes Absolute: 0.4 10*3/uL (ref 0.1–1.0)
Monocytes Relative: 5.2 % (ref 3.0–12.0)
Neutro Abs: 3.7 10*3/uL (ref 1.4–7.7)
Neutrophils Relative %: 45.4 % (ref 43.0–77.0)
Platelets: 330 10*3/uL (ref 150.0–400.0)
RBC: 4.89 Mil/uL (ref 3.87–5.11)
RDW: 15.2 % (ref 11.5–15.5)
WBC: 8.1 10*3/uL (ref 4.0–10.5)

## 2020-07-02 LAB — VITAMIN B12: Vitamin B-12: 882 pg/mL (ref 211–911)

## 2020-07-02 LAB — TSH: TSH: 0.22 u[IU]/mL — ABNORMAL LOW (ref 0.35–4.50)

## 2020-07-02 MED ORDER — SERTRALINE HCL 50 MG PO TABS: ORAL_TABLET | ORAL | 1 refills | Status: DC

## 2020-07-02 NOTE — Progress Notes (Signed)
Sherry Ray is a 40 y.o. female with the following history as recorded in EpicCare:  Patient Active Problem List   Diagnosis Date Noted  . History of cesarean section 12/31/2019  . Term pregnancy 12/31/2019  . Upper respiratory tract infection 07/05/2019  . Antithrombin deficiency ([place]) 07/14/2016  . Depression with anxiety 12/17/2015    Current Outpatient Medications  Medication Sig Dispense Refill  . cetirizine (ZYRTEC) 10 MG tablet Take 10 mg by mouth daily.     Marland Kitchen ibuprofen (ADVIL) 600 MG tablet Take 1 tablet (600 mg total) by mouth every 6 (six) hours as needed for mild pain, moderate pain or cramping. 30 tablet 0  . levonorgestrel (MIRENA) 20 MCG/24HR IUD 1 each by Intrauterine route once.    . sertraline (ZOLOFT) 50 MG tablet Take 1/2 tablet x 7 days; then increase to 1 tablet daily as directed 30 tablet 1  . NATACYN 5 % ophthalmic suspension Place 1 drop into the left eye 4 (four) times daily.     No current facility-administered medications for this visit.    Allergies: Patient has no known allergies.  Past Medical History:  Diagnosis Date  . Antithrombin deficiency ([place])    had a PE (2008) and DVT (1996), took coumadin for 1 year  . Anxiety and depression   . Depression   . DVT (deep venous thrombosis) ([place])   . Headache   . History of cholestasis during pregnancy   . Pulmonary embolism ([place])   . Vaginal Pap smear, abnormal     Past Surgical History:  Procedure Laterality Date  . CESAREAN SECTION    . CESAREAN SECTION N/A 12/31/2019   Procedure: CESAREAN SECTION;  Surgeon: Everlene Farrier, MD;  Location: [facility];  Service: Obstetrics;  Laterality: N/A;  Heather, RNFA  . CHOLECYSTECTOMY N/A 03/31/2020   Procedure: LAPAROSCOPIC CHOLECYSTECTOMY;  Surgeon: Dwan Bolt, MD;  Location: [facility];  Service: General;  Laterality: N/A;  . ESOPHAGOGASTRODUODENOSCOPY ENDOSCOPY    . EYE SURGERY Left 04/30/2020   Corneal cross linking  . FIRST RIB REMOVAL    . LEEP     . NASAL SINUS SURGERY      Family History  Problem Relation Age of Onset  . Cancer Father     Social History   Tobacco Use  . Smoking status: Never Smoker  . Smokeless tobacco: Never Used  Substance Use Topics  . Alcohol use: No    Alcohol/week: 0.0 standard drinks    Subjective:   Patient presents today as a new patient; in baseline state of health today; 6 month post-partum/ breast-feeding; has 2 children ( 2.5 yo and 6 months- both boys); In past 6 months, has had to have gallbladder surgery/ left eye surgery; Notes she is overwhelmed and not sleeping well; would be interested in   discussing medication; has met with therapist and feels like she has gotten the benefit from therapy;      Objective:  Vitals:   07/02/20 1124  BP: 118/80  Pulse: 88  Temp: 97.9 F (36.6 C)  TempSrc: Oral  SpO2: 97%  Weight: 151 lb (68.5 kg)  Height: '5\' 4"'  (1.626 m)    General: Well developed, well nourished, in no acute distress  Head: Normocephalic and atraumatic  Eyes: Sclera and conjunctiva clear; pupils round and reactive to light; extraocular movements intact  Ears: External normal; canals clear; tympanic membranes normal  Oropharynx: Pink, supple. No suspicious lesions  Neck: Supple without thyromegaly, adenopathy  Lungs: Respirations  unlabored; clear to auscultation bilaterally without wheeze, rales, rhonchi  CVS exam: normal rate and regular rhythm.  Neurologic: Alert and oriented; speech intact; face symmetrical; moves all extremities well; CNII-XII intact without focal deficit   Assessment:  1. Other fatigue     Plan:  Suspect situational depression- working to balance home/ life needs; will give trial of Zoloft- use as directed/ okay for breast-feeding; update labs today; follow-up in 1 month, sooner prn.  This visit occurred during the SARS-CoV-2 public health emergency.  Safety protocols were in place, including screening questions prior to the visit, additional usage  of staff PPE, and extensive cleaning of exam room while observing appropriate contact time as indicated for disinfecting solutions.      No follow-ups on file.  Orders Placed This Encounter  Procedures  . CBC with Differential/Platelet    Standing Status:   Future    Standing Expiration Date:   07/02/2021  . Comp Met (CMET)    Standing Status:   Future    Standing Expiration Date:   07/02/2021  . TSH    Standing Status:   Future    Standing Expiration Date:   07/02/2021  . Vitamin B12    Standing Status:   Future    Standing Expiration Date:   07/02/2021    Requested Prescriptions   Signed Prescriptions Disp Refills  . sertraline (ZOLOFT) 50 MG tablet 30 tablet 1    Sig: Take 1/2 tablet x 7 days; then increase to 1 tablet daily as directed

## 2020-07-07 ENCOUNTER — Other Ambulatory Visit: Payer: 59

## 2020-07-07 DIAGNOSIS — R7989 Other specified abnormal findings of blood chemistry: Secondary | ICD-10-CM

## 2020-07-09 ENCOUNTER — Other Ambulatory Visit: Payer: Self-pay | Admitting: Family

## 2020-07-09 DIAGNOSIS — E0590 Thyrotoxicosis, unspecified without thyrotoxic crisis or storm: Secondary | ICD-10-CM

## 2020-07-09 LAB — THYROID PANEL WITH TSH
Free Thyroxine Index: 2.4 (ref 1.4–3.8)
T3 Uptake: 31 % (ref 22–35)
T4, Total: 7.8 ug/dL (ref 5.1–11.9)
TSH: 0.19 mIU/L — ABNORMAL LOW

## 2020-08-04 ENCOUNTER — Telehealth (INDEPENDENT_AMBULATORY_CARE_PROVIDER_SITE_OTHER): Payer: 59 | Admitting: Family

## 2020-08-04 ENCOUNTER — Ambulatory Visit (INDEPENDENT_AMBULATORY_CARE_PROVIDER_SITE_OTHER)
Admission: RE | Admit: 2020-08-04 | Discharge: 2020-08-04 | Disposition: A | Discharge status: Home / Self Care | Payer: 59 | Source: Ambulatory Visit | Attending: Family | Admitting: Family

## 2020-08-04 ENCOUNTER — Other Ambulatory Visit: Payer: Self-pay

## 2020-08-04 DIAGNOSIS — R5383 Other fatigue: Secondary | ICD-10-CM | POA: Diagnosis not present

## 2020-08-04 DIAGNOSIS — M79642 Pain in left hand: Secondary | ICD-10-CM | POA: Diagnosis not present

## 2020-08-04 DIAGNOSIS — E0590 Thyrotoxicosis, unspecified without thyrotoxic crisis or storm: Secondary | ICD-10-CM

## 2020-08-04 NOTE — Progress Notes (Signed)
Sherry Ray is a 40 y.o. female with the following history as recorded in EpicCare:  Patient Active Problem List   Diagnosis Date Noted  . History of cesarean section 12/31/2019  . Term pregnancy 12/31/2019  . Upper respiratory tract infection 07/05/2019  . Antithrombin deficiency ([place]) 07/14/2016  . Depression with anxiety 12/17/2015    Current Outpatient Medications  Medication Sig Dispense Refill  . cetirizine (ZYRTEC) 10 MG tablet Take 10 mg by mouth daily.     Marland Kitchen ibuprofen (ADVIL) 600 MG tablet Take 1 tablet (600 mg total) by mouth every 6 (six) hours as needed for mild pain, moderate pain or cramping. 30 tablet 0  . levonorgestrel (MIRENA) 20 MCG/24HR IUD 1 each by Intrauterine route once.    Marland Kitchen NATACYN 5 % ophthalmic suspension Place 1 drop into the left eye 4 (four) times daily.    . sertraline (ZOLOFT) 50 MG tablet Take 1/2 tablet x 7 days; then increase to 1 tablet daily as directed 30 tablet 1   No current facility-administered medications for this visit.    Allergies: Patient has no known allergies.  Past Medical History:  Diagnosis Date  . Antithrombin deficiency ([place])    had a PE (2008) and DVT (1996), took coumadin for 1 year  . Anxiety and depression   . Depression   . DVT (deep venous thrombosis) ([place])   . Headache   . History of cholestasis during pregnancy   . Pulmonary embolism ([place])   . Vaginal Pap smear, abnormal     Past Surgical History:  Procedure Laterality Date  . CESAREAN SECTION    . CESAREAN SECTION N/A 12/31/2019   Procedure: CESAREAN SECTION;  Surgeon: Everlene Farrier, MD;  Location: [facility];  Service: Obstetrics;  Laterality: N/A;  Heather, RNFA  . CHOLECYSTECTOMY N/A 03/31/2020   Procedure: LAPAROSCOPIC CHOLECYSTECTOMY;  Surgeon: Dwan Bolt, MD;  Location: [facility];  Service: General;  Laterality: N/A;  . ESOPHAGOGASTRODUODENOSCOPY ENDOSCOPY    . EYE SURGERY Left 04/30/2020   Corneal cross linking  . FIRST RIB REMOVAL    . LEEP     . NASAL SINUS SURGERY      Family History  Problem Relation Age of Onset  . Cancer Father     Social History   Tobacco Use  . Smoking status: Never Smoker  . Smokeless tobacco: Never Used  Substance Use Topics  . Alcohol use: No    Alcohol/week: 0.0 standard drinks    Subjective:    I connected with Sherry Ray on 08/04/20 at  3:20 PM EST by a video enabled telemedicine application and verified that I am speaking with the correct person using two identifiers.   I discussed the limitations of evaluation and management by telemedicine and the availability of in person appointments. The patient expressed understanding and agreed to proceed. Provider in office/ patient is at home; provider and patient are only 2 people on video call.   1 month follow-up on recent start of Zoloft- notes still very tired but thinks medication offering some benefit; Recently injured her left hand- continuing to have pain in left knuckle that is concerning; scheduled to see endocrine to discuss possible hyperthyroidism in early March;     Objective:  There were no vitals filed for this visit.  General: Well developed, well nourished, in no acute distress  Head: Normocephalic and atraumatic  Lungs: Respirations unlabored;  Neurologic: Alert and oriented; speech intact; face symmetrical; moves all extremities well; CNII-XII  intact without focal deficit   Assessment:  1. Other fatigue   2. Left hand pain   3. Hyperthyroidism     Plan:  1. Try increasing the Zoloft to 100 mg daily; will follow-up after upcoming endocrine evaluation; 2. Update left hand Xray; 3. Call endocrine to see if she can be seen any sooner than March 8;  No follow-ups on file.  Orders Placed This Encounter  Procedures  . DG Hand Complete Left    Standing Status:   Future    Number of Occurrences:   1    Standing Expiration Date:   08/04/2021    Order Specific Question:   Reason for Exam (SYMPTOM  OR DIAGNOSIS  REQUIRED)    Answer:   left hand pain    Order Specific Question:   Is patient pregnant?    Answer:   No    Order Specific Question:   Preferred imaging location?    Answer:   [facility]    Requested Prescriptions    No prescriptions requested or ordered in this encounter

## 2020-08-19 ENCOUNTER — Other Ambulatory Visit: Payer: Self-pay

## 2020-08-19 ENCOUNTER — Encounter: Payer: Self-pay | Admitting: Endocrinology

## 2020-08-19 ENCOUNTER — Ambulatory Visit (INDEPENDENT_AMBULATORY_CARE_PROVIDER_SITE_OTHER): Payer: 59 | Admitting: Endocrinology

## 2020-08-19 DIAGNOSIS — E0590 Thyrotoxicosis, unspecified without thyrotoxic crisis or storm: Secondary | ICD-10-CM

## 2020-08-19 LAB — TSH: TSH: 0.27 u[IU]/mL — ABNORMAL LOW (ref 0.35–4.50)

## 2020-08-19 LAB — T4, FREE: Free T4: 0.76 ng/dL (ref 0.60–1.60)

## 2020-08-19 NOTE — Progress Notes (Signed)
Subjective:    Patient ID: Sherry Ray, female    DOB: 15-Feb-1981, 40 y.o.   MRN: [id number]  HPI Pt is referred by Jodi Mourning, NP, for hyperthyroidism.  she was dx'ed with hyperthyroidism last month.  she has never been on therapy for this.  she has never had XRT to the anterior neck, or thyroid surgery.  He has never had thyroid imaging.  she does not consume kelp or any other non-prescribed thyroid medication.  she has never been on amiodarone. pt states she feels well in general, except for night sweats, anxiety, and tremor.  She is breast feeding, but she is not at risk for another pregnancy.   Past Medical History:  Diagnosis Date  . Antithrombin deficiency ([place])    had a PE (2008) and DVT (1996), took coumadin for 1 year  . Anxiety and depression   . Depression   . DVT (deep venous thrombosis) ([place])   . Headache   . History of cholestasis during pregnancy   . Pulmonary embolism ([place])   . Vaginal Pap smear, abnormal     Past Surgical History:  Procedure Laterality Date  . CESAREAN SECTION    . CESAREAN SECTION N/A 12/31/2019   Procedure: CESAREAN SECTION;  Surgeon: Everlene Farrier, MD;  Location: [facility];  Service: Obstetrics;  Laterality: N/A;  Heather, RNFA  . CHOLECYSTECTOMY N/A 03/31/2020   Procedure: LAPAROSCOPIC CHOLECYSTECTOMY;  Surgeon: Dwan Bolt, MD;  Location: [facility];  Service: General;  Laterality: N/A;  . ESOPHAGOGASTRODUODENOSCOPY ENDOSCOPY    . EYE SURGERY Left 04/30/2020   Corneal cross linking  . FIRST RIB REMOVAL    . LEEP    . NASAL SINUS SURGERY      Social History   Socioeconomic History  . Marital status: Married    Spouse name: Not on file  . Number of children: Not on file  . Years of education: Not on file  . Highest education level: Not on file  Occupational History  . Not on file  Tobacco Use  . Smoking status: Never Smoker  . Smokeless tobacco: Never Used  Vaping Use  . Vaping Use: Never used  Substance and Sexual  Activity  . Alcohol use: No    Alcohol/week: 0.0 standard drinks  . Drug use: No  . Sexual activity: Yes    Birth control/protection: None  Other Topics Concern  . Not on file  Social History Narrative  . Not on file   Social Determinants of Health   Financial Resource Strain: Not on file  Food Insecurity: Not on file  Transportation Needs: Not on file  Physical Activity: Not on file  Stress: Not on file  Social Connections: Not on file  Intimate Partner Violence: Not on file    Current Outpatient Medications on File Prior to Visit  Medication Sig Dispense Refill  . cetirizine (ZYRTEC) 10 MG tablet Take 10 mg by mouth daily.     Marland Kitchen ibuprofen (ADVIL) 600 MG tablet Take 1 tablet (600 mg total) by mouth every 6 (six) hours as needed for mild pain, moderate pain or cramping. 30 tablet 0  . levonorgestrel (MIRENA) 20 MCG/24HR IUD 1 each by Intrauterine route once.    Marland Kitchen NATACYN 5 % ophthalmic suspension Place 1 drop into the left eye 4 (four) times daily.    . sertraline (ZOLOFT) 50 MG tablet Take 1/2 tablet x 7 days; then increase to 1 tablet daily as directed 30 tablet 1  No current facility-administered medications on file prior to visit.    No Known Allergies  Family History  Problem Relation Age of Onset  . Cancer Father   . Thyroid disease Neg Hx     BP 118/70 (BP Location: Right Arm, Patient Position: Sitting, Cuff Size: Normal)   Pulse 93   Ht 5\' 4"  (1.626 m)   Wt 150 lb 6.4 oz (68.2 kg)   SpO2 97%   BMI 25.82 kg/m    Review of Systems denies weight loss, palpitations, sob, and heat intolerance.      Objective:   Physical Exam VS: see vs page GEN: no distress HEAD: head: no deformity eyes: no periorbital swelling, no proptosis external nose and ears are normal NECK: thyroid is 3-5 times normal size (R>L), but no palpable nodule. CHEST WALL: no deformity. LUNGS: clear to auscultation CV: reg rate and rhythm, no murmur.  MUSCULOSKELETAL: gait is normal  and steady EXTEMITIES: no deformity.  no leg edema NEURO:  readily moves all 4's.  sensation is intact to touch on all 4's. No tremor SKIN:  Normal texture and temperature.  No rash or suspicious lesion is visible.  Not diaphoretic NODES:  None palpable at the neck.  PSYCH: alert, well-oriented.  Does not appear anxious nor depressed.     Lab Results  Component Value Date   TSH 0.19 (L) 07/07/2020   T4TOTAL 7.8 07/07/2020   I have reviewed outside records, and summarized: Pt was noted to have low TSH, and referred here.  She had uneventful delivery in mid-2021.       Assessment & Plan:  Hyperthyroidism, new to me, uncertain etiology and prognosis. Lactation: this is a relative contraindication to thionamide rx.    Patient Instructions  Blood tests are requested for you today.  We'll let you know about the results.  I would be happy to request if you want--just let me know.  You should wait until you are done breast feeding to take the methimazole medication.   Please come back for a follow-up appointment in 2 months.

## 2020-08-19 NOTE — Patient Instructions (Signed)
Blood tests are requested for you today.  We'll let you know about the results.  I would be happy to request if you want--just let me know.  You should wait until you are done breast feeding to take the methimazole medication.   Please come back for a follow-up appointment in 2 months.

## 2020-08-28 ENCOUNTER — Encounter: Payer: Self-pay | Admitting: Family

## 2020-08-29 ENCOUNTER — Other Ambulatory Visit: Payer: Self-pay | Admitting: Family

## 2020-08-29 MED ORDER — SERTRALINE HCL 100 MG PO TABS: 100.0000 mg | ORAL_TABLET | Freq: Every day | ORAL | 1 refills | Status: DC

## 2020-10-20 ENCOUNTER — Other Ambulatory Visit: Payer: Self-pay

## 2020-10-20 ENCOUNTER — Ambulatory Visit (INDEPENDENT_AMBULATORY_CARE_PROVIDER_SITE_OTHER): Payer: 59 | Admitting: Endocrinology

## 2020-10-20 VITALS — BP 130/76 | HR 97 | Ht 64.0 in | Wt 153.0 lb

## 2020-10-20 DIAGNOSIS — E0590 Thyrotoxicosis, unspecified without thyrotoxic crisis or storm: Secondary | ICD-10-CM

## 2020-10-20 LAB — T4, FREE: Free T4: 0.74 ng/dL (ref 0.60–1.60)

## 2020-10-20 LAB — TSH: TSH: 0.16 u[IU]/mL — ABNORMAL LOW (ref 0.35–4.50)

## 2020-10-20 NOTE — Progress Notes (Signed)
Subjective:    Patient ID: Sherry Ray, female    DOB: July 13, 1980, 40 y.o.   MRN: [id number]  HPI Pt returns for f/u of hyperthyroidism (dx'ed early 2022; x was deferred, due mild severity, and lactation; she has never had thyroid imaging).  9 mos postpartum.  pt states she feels well in general.  She is still breast feeding.  She requests [place] if today's TFT are abnormal again.   Past Medical History:  Diagnosis Date  . Antithrombin deficiency ([place])    had a PE (2008) and DVT (1996), took coumadin for 1 year  . Anxiety and depression   . Depression   . DVT (deep venous thrombosis) ([place])   . Headache   . History of cholestasis during pregnancy   . Pulmonary embolism ([place])   . Vaginal Pap smear, abnormal     Past Surgical History:  Procedure Laterality Date  . CESAREAN SECTION    . CESAREAN SECTION N/A 12/31/2019   Procedure: CESAREAN SECTION;  Surgeon: Everlene Farrier, MD;  Location: [facility];  Service: Obstetrics;  Laterality: N/A;  Heather, RNFA  . CHOLECYSTECTOMY N/A 03/31/2020   Procedure: LAPAROSCOPIC CHOLECYSTECTOMY;  Surgeon: Dwan Bolt, MD;  Location: [facility];  Service: General;  Laterality: N/A;  . ESOPHAGOGASTRODUODENOSCOPY ENDOSCOPY    . EYE SURGERY Left 04/30/2020   Corneal cross linking  . FIRST RIB REMOVAL    . LEEP    . NASAL SINUS SURGERY      Social History   Socioeconomic History  . Marital status: Married    Spouse name: Not on file  . Number of children: Not on file  . Years of education: Not on file  . Highest education level: Not on file  Occupational History  . Not on file  Tobacco Use  . Smoking status: Never Smoker  . Smokeless tobacco: Never Used  Vaping Use  . Vaping Use: Never used  Substance and Sexual Activity  . Alcohol use: No    Alcohol/week: 0.0 standard drinks  . Drug use: No  . Sexual activity: Yes    Birth control/protection: None  Other Topics Concern  . Not on file  Social History Narrative  . Not on file    Social Determinants of Health   Financial Resource Strain: Not on file  Food Insecurity: Not on file  Transportation Needs: Not on file  Physical Activity: Not on file  Stress: Not on file  Social Connections: Not on file  Intimate Partner Violence: Not on file    Current Outpatient Medications on File Prior to Visit  Medication Sig Dispense Refill  . cetirizine (ZYRTEC) 10 MG tablet Take 10 mg by mouth daily.     Marland Kitchen ibuprofen (ADVIL) 600 MG tablet Take 1 tablet (600 mg total) by mouth every 6 (six) hours as needed for mild pain, moderate pain or cramping. 30 tablet 0  . levonorgestrel (MIRENA) 20 MCG/24HR IUD 1 each by Intrauterine route once.    Marland Kitchen NATACYN 5 % ophthalmic suspension Place 1 drop into the left eye 4 (four) times daily.    . sertraline (ZOLOFT) 100 MG tablet Take 1 tablet (100 mg total) by mouth daily. 90 tablet 1   No current facility-administered medications on file prior to visit.    No Known Allergies  Family History  Problem Relation Age of Onset  . Cancer Father   . Thyroid disease Neg Hx     BP 130/76 (BP Location: Right Arm, Patient Position:  Sitting, Cuff Size: Normal)   Pulse 97   Ht 5\' 4"  (1.626 m)   Wt 153 lb (69.4 kg)   SpO2 97%   BMI 26.26 kg/m    Review of Systems     Objective:   Physical Exam VITAL SIGNS:  See vs page GENERAL: no distress NECK: thyroid is 3 times normal size, but no palpable nodule.   Lab Results  Component Value Date   TSH 0.16 (L) 10/20/2020   T4TOTAL 7.8 07/07/2020      Assessment & Plan:  Hyperthyroidism: slightly worse Lactation: we'll hold off on medication for now Goiter: check [place], at pt's request. Please come back for a follow-up appointment in 4 months

## 2020-10-20 NOTE — Patient Instructions (Addendum)
Blood tests are requested for you today.  We'll let you know about the results.  If possible, you should wait until you are done breast feeding to take the methimazole medication.   Please come back for a follow-up appointment in 4 months.

## 2020-11-03 ENCOUNTER — Encounter: Payer: Self-pay | Admitting: Endocrinology

## 2020-11-20 ENCOUNTER — Ambulatory Visit
Admission: RE | Admit: 2020-11-20 | Discharge: 2020-11-20 | Disposition: A | Discharge status: Home / Self Care | Payer: 59 | Source: Ambulatory Visit | Attending: Endocrinology | Admitting: Endocrinology

## 2020-11-20 DIAGNOSIS — E0590 Thyrotoxicosis, unspecified without thyrotoxic crisis or storm: Secondary | ICD-10-CM

## 2020-11-21 ENCOUNTER — Encounter: Payer: Self-pay | Admitting: Endocrinology

## 2021-02-12 ENCOUNTER — Telehealth: Payer: Self-pay | Admitting: Genetic Counselor

## 2021-02-12 NOTE — Telephone Encounter (Signed)
Sherry Ray called to discuss her mother genetic testing results (VUS in CHEK2 at [facility]).  Discussed that we do not have enough data to classify this as benign or pathogenic at this time.  Mother will be contact in the event that it is reclassified.  Do not recommend Sherry Ray have testing for this based on the uncertain classification at this time. Discussed importance of informing her gynecologist of family history and hormonal factors in order to run [place] score to determine necessity of breast MRIs in addition to mammograms.  Sherry Ray knows that we are available as needed to take detailed family history and run [employer] score as well.

## 2021-02-24 ENCOUNTER — Ambulatory Visit (INDEPENDENT_AMBULATORY_CARE_PROVIDER_SITE_OTHER): Payer: 59 | Admitting: Endocrinology

## 2021-02-24 ENCOUNTER — Other Ambulatory Visit: Payer: Self-pay

## 2021-02-24 VITALS — BP 122/80 | HR 94 | Ht 64.0 in | Wt 152.4 lb

## 2021-02-24 DIAGNOSIS — E0590 Thyrotoxicosis, unspecified without thyrotoxic crisis or storm: Secondary | ICD-10-CM

## 2021-02-24 LAB — T4, FREE: Free T4: 0.88 ng/dL (ref 0.60–1.60)

## 2021-02-24 LAB — TSH: TSH: 0.35 u[IU]/mL (ref 0.35–5.50)

## 2021-02-24 NOTE — Patient Instructions (Addendum)
Blood tests are requested for you today.  We'll let you know about the results.   If possible, you should wait until you are done breast feeding to take the methimazole medication.   Please come back for a follow-up appointment in 6 months.

## 2021-02-24 NOTE — Progress Notes (Signed)
Subjective:    Patient ID: Sherry Ray, female    DOB: 09-15-1980, 40 y.o.   MRN: [id number]  HPI Pt returns for f/u of hyperthyroidism (dx'ed early 2022; x was deferred, due mild severity, and lactation; she has never had thyroid imaging; [place] in 2022 showed RUP nodule--1 year f/u advised).  pt states she feels well in general, except for night sweats. She is still breast feeding.  She is not at risk for another pregnancy.   Past Medical History:  Diagnosis Date   Antithrombin deficiency ([place])    had a PE (2008) and DVT (1996), took coumadin for 1 year   Anxiety and depression    Depression    DVT (deep venous thrombosis) ([name])    Headache    History of cholestasis during pregnancy    Pulmonary embolism ([place])    Vaginal Pap smear, abnormal     Past Surgical History:  Procedure Laterality Date   CESAREAN SECTION     CESAREAN SECTION N/A 12/31/2019   Procedure: CESAREAN SECTION;  Surgeon: Everlene Farrier, MD;  Location: [facility];  Service: Obstetrics;  Laterality: N/ANira Conn, RNFA   CHOLECYSTECTOMY N/A 03/31/2020   Procedure: LAPAROSCOPIC CHOLECYSTECTOMY;  Surgeon: Dwan Bolt, MD;  Location: [facility];  Service: General;  Laterality: N/A;   ESOPHAGOGASTRODUODENOSCOPY ENDOSCOPY     EYE SURGERY Left 04/30/2020   Corneal cross linking   FIRST RIB REMOVAL     LEEP     NASAL SINUS SURGERY      Social History   Socioeconomic History   Marital status: Married    Spouse name: Not on file   Number of children: Not on file   Years of education: Not on file   Highest education level: Not on file  Occupational History   Not on file  Tobacco Use   Smoking status: Never   Smokeless tobacco: Never  Vaping Use   Vaping Use: Never used  Substance and Sexual Activity   Alcohol use: No    Alcohol/week: 0.0 standard drinks   Drug use: No   Sexual activity: Yes    Birth control/protection: None  Other Topics Concern   Not on file  Social History Narrative   Not on file    Social Determinants of Health   Financial Resource Strain: Not on file  Food Insecurity: Not on file  Transportation Needs: Not on file  Physical Activity: Not on file  Stress: Not on file  Social Connections: Not on file  Intimate Partner Violence: Not on file    Current Outpatient Medications on File Prior to Visit  Medication Sig Dispense Refill   cetirizine (ZYRTEC) 10 MG tablet Take 10 mg by mouth daily.      ibuprofen (ADVIL) 600 MG tablet Take 1 tablet (600 mg total) by mouth every 6 (six) hours as needed for mild pain, moderate pain or cramping. 30 tablet 0   levonorgestrel (MIRENA) 20 MCG/24HR IUD 1 each by Intrauterine route once.     NATACYN 5 % ophthalmic suspension Place 1 drop into the left eye 4 (four) times daily.     sertraline (ZOLOFT) 100 MG tablet Take 1 tablet (100 mg total) by mouth daily. 90 tablet 1   No current facility-administered medications on file prior to visit.    No Known Allergies  Family History  Problem Relation Age of Onset   Cancer Father    Thyroid disease Neg Hx     BP 122/80 (BP  Location: Right Arm, Patient Position: Sitting, Cuff Size: Normal)   Pulse 94   Ht '5\' 4"'$  (1.626 m)   Wt 152 lb 6.4 oz (69.1 kg)   SpO2 96%   BMI 26.16 kg/m    Review of Systems     Objective:   Physical Exam VITAL SIGNS:  See vs page GENERAL: no distress NECK: I cannot feel the nodule seen on [place].   Lab Results  Component Value Date   TSH 0.35 02/24/2021   T4TOTAL 7.8 07/07/2020      Assessment & Plan:  Hyperthyroidism: spontaneously better, but she is at high rick for recurrence. I told pt no medication is needed now Thyroids nodule: we'll plan to recheck next year

## 2021-05-11 ENCOUNTER — Other Ambulatory Visit: Payer: Self-pay | Admitting: Family

## 2021-05-15 ENCOUNTER — Ambulatory Visit (INDEPENDENT_AMBULATORY_CARE_PROVIDER_SITE_OTHER): Payer: 59 | Admitting: Family

## 2021-05-15 ENCOUNTER — Encounter: Payer: Self-pay | Admitting: Family

## 2021-05-15 VITALS — Ht 64.0 in | Wt 153.6 lb

## 2021-05-15 DIAGNOSIS — Z0000 Encounter for general adult medical examination without abnormal findings: Secondary | ICD-10-CM

## 2021-05-15 MED ORDER — SERTRALINE HCL 100 MG PO TABS: 100.0000 mg | ORAL_TABLET | Freq: Every day | ORAL | 3 refills | Status: DC

## 2021-05-15 NOTE — Progress Notes (Signed)
Sherry Ray is a 40 y.o. female with the following history as recorded in EpicCare:  Patient Active Problem List   Diagnosis Date Noted   Hyperthyroidism 08/19/2020   History of cesarean section 12/31/2019   Term pregnancy 12/31/2019   Upper respiratory tract infection 07/05/2019   Antithrombin deficiency ([name]) 07/14/2016   Depression with anxiety 12/17/2015    Current Outpatient Medications  Medication Sig Dispense Refill   cetirizine (ZYRTEC) 10 MG tablet Take 10 mg by mouth daily.      ibuprofen (ADVIL) 600 MG tablet Take 1 tablet (600 mg total) by mouth every 6 (six) hours as needed for mild pain, moderate pain or cramping. 30 tablet 0   levonorgestrel (MIRENA) 20 MCG/24HR IUD 1 each by Intrauterine route once.     sertraline (ZOLOFT) 100 MG tablet Take 1 tablet (100 mg total) by mouth daily. 90 tablet 3   No current facility-administered medications for this visit.    Allergies: Patient has no known allergies.  Past Medical History:  Diagnosis Date   Antithrombin deficiency ([name])    had a PE (2008) and DVT (1996), took coumadin for 1 year   Anxiety and depression    Depression    DVT (deep venous thrombosis) ([name])    Headache    History of cholestasis during pregnancy    Pulmonary embolism ([name])    Vaginal Pap smear, abnormal     Past Surgical History:  Procedure Laterality Date   CESAREAN SECTION     CESAREAN SECTION N/A 12/31/2019   Procedure: CESAREAN SECTION;  Surgeon: Everlene Farrier, MD;  [facility]: [facility];  Service: Obstetrics;  Laterality: N/A;  Nira Conn, RNFA   CHOLECYSTECTOMY N/A 03/31/2020   Procedure: LAPAROSCOPIC CHOLECYSTECTOMY;  Surgeon: Dwan Bolt, MD;  Location: [facility];  Service: General;  Laterality: N/A;   ESOPHAGOGASTRODUODENOSCOPY ENDOSCOPY     EYE SURGERY Left 04/30/2020   Corneal cross linking   FIRST RIB REMOVAL     LEEP     NASAL SINUS SURGERY      Family History  Problem Relation Age of Onset   Cancer Father    Thyroid disease  Neg Hx     Social History   Tobacco Use   Smoking status: Never   Smokeless tobacco: Never  Substance Use Topics   Alcohol use: No    Alcohol/week: 0.0 standard drinks    Subjective:  Patient presents for yearly CPE; does have GYN; continuing to work with endocrinologist for management of hyperthyroidism;   40yo and 1.5 yo children/ breast- feeding; working full time at [employer]- started in the fall; increased stress in the past few months adjusting to life changes; both parents diagnosed with cancer in the past year; mother breast cancer; father stomach cancer;  Prefers to wait on mammogram until she has finished breast feeding;  Up to date on dental and eye exams;    Review of Systems  Constitutional:  Positive for malaise/fatigue.  HENT: Negative.    Eyes: Negative.   Respiratory: Negative.    Cardiovascular: Negative.   Gastrointestinal: Negative.   Genitourinary: Negative.   Musculoskeletal: Negative.   Skin: Negative.   Neurological: Negative.   Endo/Heme/Allergies: Negative.   Psychiatric/Behavioral: Negative.        Objective:  Vitals:   05/15/21 1313  Weight: 153 lb 9.6 oz (69.7 kg)  Height: 5\' 4"  (1.626 m)    General: Well developed, well nourished, in no acute distress  Skin : Warm and dry.  Head:  Normocephalic and atraumatic  Eyes: Sclera and conjunctiva clear; pupils round and reactive to light; extraocular movements intact  Ears: External normal; canals clear; tympanic membranes normal  Oropharynx: Pink, supple. No suspicious lesions  Neck: Supple without thyromegaly, adenopathy  Lungs: Respirations unlabored; clear to auscultation bilaterally without wheeze, rales, rhonchi  CVS exam: normal rate and regular rhythm.  Abdomen: Soft; nontender; nondistended; normoactive bowel sounds; no masses or hepatosplenomegaly  Musculoskeletal: No deformities; no active joint inflammation  Extremities: No edema, cyanosis, clubbing  Vessels: Symmetric bilaterally   Neurologic: Alert and oriented; speech intact; face symmetrical; moves all extremities well; CNII-XII intact without focal deficit   Assessment:  1. PE (physical exam), annual     Plan:  Age appropriate preventive healthcare needs addressed; encouraged regular eye doctor and dental exams; encouraged regular exercise; will update labs and refills as needed today; follow-up to be determined; Patient plans to stop breast-feeding in 6 months and wants to re-evaluate mammogram at that time;  She will keep planned follow up with endocrinology as well for June 2023; She will reach out if she feels her Zoloft dosage needs to be increased;   This visit occurred during the SARS-CoV-2 public health emergency.  Safety protocols were in place, including screening questions prior to the visit, additional usage of staff PPE, and extensive cleaning of exam room while observing appropriate contact time as indicated for disinfecting solutions.    No follow-ups on file.  No orders of the defined types were placed in this encounter.   Requested Prescriptions   Signed Prescriptions Disp Refills   sertraline (ZOLOFT) 100 MG tablet 90 tablet 3    Sig: Take 1 tablet (100 mg total) by mouth daily.

## 2021-05-28 ENCOUNTER — Telehealth: Payer: 59 | Admitting: Physician Assistant

## 2021-05-28 DIAGNOSIS — J020 Streptococcal pharyngitis: Secondary | ICD-10-CM

## 2021-05-28 MED ORDER — FLUCONAZOLE 150 MG PO TABS: 150.0000 mg | ORAL_TABLET | Freq: Once | ORAL | 0 refills | Status: AC

## 2021-05-28 MED ORDER — AMOXICILLIN 500 MG PO CAPS: 500.0000 mg | ORAL_CAPSULE | Freq: Two times a day (BID) | ORAL | 0 refills | Status: DC

## 2021-05-28 MED ORDER — BENZONATATE 100 MG PO CAPS
100.0000 mg | ORAL_CAPSULE | Freq: Three times a day (TID) | ORAL | 0 refills | Status: DC | PRN
Start: 2021-05-28 — End: 2021-12-15

## 2021-05-28 NOTE — Progress Notes (Signed)
Virtual Visit Consent   Sherry Ray, you are scheduled for a virtual visit with a [facility] provider today.     Just as with appointments in the office, your consent must be obtained to participate.  Your consent will be active for this visit and any virtual visit you may have with one of our providers in the next 365 days.     If you have a MyChart account, a copy of this consent can be sent to you electronically.  All virtual visits are billed to your insurance company just like a traditional visit in the office.    As this is a virtual visit, video technology does not allow for your provider to perform a traditional examination.  This may limit your provider's ability to fully assess your condition.  If your provider identifies any concerns that need to be evaluated in person or the need to arrange testing (such as labs, EKG, etc.), we will make arrangements to do so.     Although advances in technology are sophisticated, we cannot ensure that it will always work on either your end or our end.  If the connection with a video visit is poor, the visit may have to be switched to a telephone visit.  With either a video or telephone visit, we are not always able to ensure that we have a secure connection.     I need to obtain your verbal consent now.   Are you willing to proceed with your visit today?    Sherry Ray has provided verbal consent on 05/28/2021 for a virtual visit (video or telephone).   Mar Daring, PA-C   Date: 05/28/2021 5:07 PM   Virtual Visit via Video Note   I, Mar Daring, connected with  Sherry Ray  ([id number], 1980-08-22) on 05/28/21 at  5:00 PM EST by a video-enabled telemedicine application and verified that I am speaking with the correct person using two identifiers.  Location: Patient: Virtual Visit Location Patient: Home Provider: Virtual Visit Location Provider: Home Office   I discussed the limitations of evaluation and  management by telemedicine and the availability of in person appointments. The patient expressed understanding and agreed to proceed.    History of Present Illness: Sherry Ray is a 40 y.o. who identifies as a female who was assigned female at birth, and is being seen today for possible strep throat.  HPI: Sore Throat  This is a new problem. The current episode started yesterday. The problem has been gradually worsening. There has been no fever. The pain is mild. Associated symptoms include congestion, coughing, a hoarse voice (scratchy, not hoarse), swollen glands and trouble swallowing. She has had exposure to strep. Exposure to: son. She has tried acetaminophen and NSAIDs for the symptoms. The treatment provided no relief.  She did have flu and covid testing this morning, results pending.   Problems:  Patient Active Problem List   Diagnosis Date Noted   Hyperthyroidism 08/19/2020   History of cesarean section 12/31/2019   Term pregnancy 12/31/2019   Upper respiratory tract infection 07/05/2019   Antithrombin deficiency ([place]) 07/14/2016   Depression with anxiety 12/17/2015    Allergies: No Known Allergies Medications:  Current Outpatient Medications:    amoxicillin (AMOXIL) 500 MG capsule, Take 1 capsule (500 mg total) by mouth 2 (two) times daily for 10 days., Disp: 10 capsule, Rfl: 0   benzonatate (TESSALON) 100 MG capsule, Take 1 capsule (100 mg total) by mouth 3 (  three) times daily as needed., Disp: 30 capsule, Rfl: 0   fluconazole (DIFLUCAN) 150 MG tablet, Take 1 tablet (150 mg total) by mouth once for 1 dose. May repeat in 72 hours if needed, Disp: 2 tablet, Rfl: 0   cetirizine (ZYRTEC) 10 MG tablet, Take 10 mg by mouth daily. , Disp: , Rfl:    ibuprofen (ADVIL) 600 MG tablet, Take 1 tablet (600 mg total) by mouth every 6 (six) hours as needed for mild pain, moderate pain or cramping., Disp: 30 tablet, Rfl: 0   levonorgestrel (MIRENA) 20 MCG/24HR IUD, 1 each by Intrauterine  route once., Disp: , Rfl:    sertraline (ZOLOFT) 100 MG tablet, Take 1 tablet (100 mg total) by mouth daily., Disp: 90 tablet, Rfl: 3  Observations/Objective: Patient is well-developed, well-nourished in no acute distress.  Resting comfortably at home.  Head is normocephalic, atraumatic.  No labored breathing.  Speech is clear and coherent with logical content.  Patient is alert and oriented at baseline.    Assessment and Plan: 1. Strep throat - amoxicillin (AMOXIL) 500 MG capsule; Take 1 capsule (500 mg total) by mouth 2 (two) times daily for 10 days.  Dispense: 10 capsule; Refill: 0 - fluconazole (DIFLUCAN) 150 MG tablet; Take 1 tablet (150 mg total) by mouth once for 1 dose. May repeat in 72 hours if needed  Dispense: 2 tablet; Refill: 0 - benzonatate (TESSALON) 100 MG capsule; Take 1 capsule (100 mg total) by mouth 3 (three) times daily as needed.  Dispense: 30 capsule; Refill: 0  - Strep throat suspected as son currently has same symptoms (started a day before hers) and he tested positive for strep - Amoxicillin prescribed - Tessalon perles for cough - Gets yeast infections with antibiotics. Diflucan given - Salt water gargles - Tylenol and ibuprofen as needed for fever/body aches - Push fluids - Seek in person evaluation if not improving or if symptoms worsen  Follow Up Instructions: I discussed the assessment and treatment plan with the patient. The patient was provided an opportunity to ask questions and all were answered. The patient agreed with the plan and demonstrated an understanding of the instructions.  A copy of instructions were sent to the patient via MyChart unless otherwise noted below.    The patient was advised to call back or seek an in-person evaluation if the symptoms worsen or if the condition fails to improve as anticipated.  Time:  I spent 10 minutes with the patient via telehealth technology discussing the above problems/concerns.    Mar Daring, PA-C

## 2021-05-28 NOTE — Patient Instructions (Signed)
[name], thank you for joining Mar Daring, PA-C for today's virtual visit.  While this provider is not your primary care provider (PCP), if your PCP is located in our provider database this encounter information will be shared with them immediately following your visit.  Consent: (Patient) Sherry Ray provided verbal consent for this virtual visit at the beginning of the encounter.  Current Medications:  Current Outpatient Medications:    amoxicillin (AMOXIL) 500 MG capsule, Take 1 capsule (500 mg total) by mouth 2 (two) times daily for 10 days., Disp: 10 capsule, Rfl: 0   benzonatate (TESSALON) 100 MG capsule, Take 1 capsule (100 mg total) by mouth 3 (three) times daily as needed., Disp: 30 capsule, Rfl: 0   fluconazole (DIFLUCAN) 150 MG tablet, Take 1 tablet (150 mg total) by mouth once for 1 dose. May repeat in 72 hours if needed, Disp: 2 tablet, Rfl: 0   cetirizine (ZYRTEC) 10 MG tablet, Take 10 mg by mouth daily. , Disp: , Rfl:    ibuprofen (ADVIL) 600 MG tablet, Take 1 tablet (600 mg total) by mouth every 6 (six) hours as needed for mild pain, moderate pain or cramping., Disp: 30 tablet, Rfl: 0   levonorgestrel (MIRENA) 20 MCG/24HR IUD, 1 each by Intrauterine route once., Disp: , Rfl:    sertraline (ZOLOFT) 100 MG tablet, Take 1 tablet (100 mg total) by mouth daily., Disp: 90 tablet, Rfl: 3   Medications ordered in this encounter:  Meds ordered this encounter  Medications   amoxicillin (AMOXIL) 500 MG capsule    Sig: Take 1 capsule (500 mg total) by mouth 2 (two) times daily for 10 days.    Dispense:  10 capsule    Refill:  0    Order Specific Question:   Supervising Provider    Answer:   MILLER, BRIAN [[id number]]   fluconazole (DIFLUCAN) 150 MG tablet    Sig: Take 1 tablet (150 mg total) by mouth once for 1 dose. May repeat in 72 hours if needed    Dispense:  2 tablet    Refill:  0    Order Specific Question:   Supervising Provider    Answer:   MILLER, BRIAN  [[id number]]   benzonatate (TESSALON) 100 MG capsule    Sig: Take 1 capsule (100 mg total) by mouth 3 (three) times daily as needed.    Dispense:  30 capsule    Refill:  0    Order Specific Question:   Supervising Provider    Answer:   Sabra Heck, [place]     *If you need refills on other medications prior to your next appointment, please contact your pharmacy*  Follow-Up: Call back or seek an in-person evaluation if the symptoms worsen or if the condition fails to improve as anticipated.  Other Instructions Strep Throat, Adult Strep throat is an infection in the throat that is caused by bacteria. It is common during the cold months of the year. It mostly affects children who are 29-39 years old. However, people of all ages can get it at any time of the year. This infection spreads from person to person (is contagious) through coughing, sneezing, or having close contact. Your health care provider may use other names to describe the infection. When strep throat affects the tonsils, it is called tonsillitis. When it affects the back of the throat, it is called pharyngitis. What are the causes? This condition is caused by the Streptococcus pyogenes bacteria. What increases the risk?  You are more likely to develop this condition if: You care for school-age children, or are around school-age children. Children are more likely to get strep throat and may spread it to others. You spend time in crowded places where the infection can spread easily. You have close contact with someone who has strep throat. What are the signs or symptoms? Symptoms of this condition include: Fever or chills. Redness, swelling, or pain in the tonsils or throat. Pain or difficulty when swallowing. White or yellow spots on the tonsils or throat. Tender glands in the neck and under the jaw. Bad smelling breath. Red rash all over the body. This is rare. How is this diagnosed? This condition is diagnosed by tests that  check for the presence and the amount of bacteria that cause strep throat. They are: Rapid strep test. Your throat is swabbed and checked for the presence of bacteria. Results are usually ready in minutes. Throat culture test. Your throat is swabbed. The sample is placed in a cup that allows infections to grow. Results are usually ready in 1 or 2 days. How is this treated? This condition may be treated with: Medicines that kill germs (antibiotics). Medicines that relieve pain or fever. These include: Ibuprofen or acetaminophen. Aspirin, only for people who are over the age of 51. Throat lozenges. Throat sprays. Follow these instructions at home: Medicines  Take over-the-counter and prescription medicines only as told by your health care provider. Take your antibiotic medicine as told by your health care provider. Do not stop taking the antibiotic even if you start to feel better. Eating and drinking  If you have trouble swallowing, try eating soft foods until your sore throat feels better. Drink enough fluid to keep your urine pale yellow. To help relieve pain, you may have: Warm fluids, such as soup and tea. Cold fluids, such as frozen desserts or popsicles. General instructions Gargle with a salt-water mixture 3-4 times a day or as needed. To make a salt-water mixture, completely dissolve -1 tsp (3-6 g) of salt in 1 cup (237 mL) of warm water. Get plenty of rest. Stay home from work or school until you have been taking antibiotics for 24 hours. Do not use any products that contain nicotine or tobacco. These products include cigarettes, chewing tobacco, and vaping devices, such as e-cigarettes. If you need help quitting, ask your health care provider. It is up to you to get your test results. Ask your health care provider, or the department that is doing the test, when your results will be ready. Keep all follow-up visits. This is important. How is this prevented?  Do not share  food, drinking cups, or personal items that could cause the infection to spread to other people. Wash your hands often with soap and water for at least 20 seconds. If soap and water are not available, use hand sanitizer. Make sure that all people in your house wash their hands well. Have family members tested if they have a sore throat or fever. They may need an antibiotic if they have strep throat. Contact a health care provider if: You have swelling in your neck that keeps getting bigger. You develop a rash, cough, or earache. You cough up a thick mucus that is green, yellow-brown, or bloody. You have pain or discomfort that does not get better with medicine. Your symptoms seem to be getting worse. You have a fever. Get help right away if: You have new symptoms, such as vomiting, severe headache, stiff  or painful neck, chest pain, or shortness of breath. You have severe throat pain, drooling, or changes in your voice. You have swelling of the neck, or the skin on the neck becomes red and tender. You have signs of dehydration, such as tiredness (fatigue), dry mouth, and decreased urination. You become increasingly sleepy, or you cannot wake up completely. Your joints become red or painful. These symptoms may represent a serious problem that is an emergency. Do not wait to see if the symptoms will go away. Get medical help right away. Call your local emergency services (911 in the [place] Do not drive yourself to the hospital. Summary Strep throat is an infection in the throat that is caused by the Streptococcus pyogenes bacteria. This infection is spread from person to person (is contagious) through coughing, sneezing, or having close contact. Take your medicines, including antibiotics, as told by your health care provider. Do not stop taking the antibiotic even if you start to feel better. To prevent the spread of germs, wash your hands well with soap and water. Have others do the same. Do not  share food, drinking cups, or personal items. Get help right away if you have new symptoms, such as vomiting, severe headache, stiff or painful neck, chest pain, or shortness of breath. This information is not intended to replace advice given to you by your health care provider. Make sure you discuss any questions you have with your health care provider. Document Revised: 09/23/2020 Document Reviewed: 09/23/2020 Elsevier Patient Education  2022 [employer].    If you have been instructed to have an in-person evaluation today at a local Urgent Care facility, please use the link below. It will take you to a list of all of our available [facility], including address, phone number and hours of operation. Please do not delay care.  [facility]  If you or a family member do not have a primary care provider, use the link below to schedule a visit and establish care. When you choose a [facility] primary care physician or advanced practice provider, you gain a long-term partner in health. Find a Primary Care Provider  Learn more about 's in-office and virtual care options: [facility]

## 2021-05-29 MED ORDER — AMOXICILLIN 500 MG PO CAPS: 500.0000 mg | ORAL_CAPSULE | Freq: Two times a day (BID) | ORAL | 0 refills | Status: AC

## 2021-05-29 NOTE — Addendum Note (Signed)
Addended by: Mar Daring on: 05/29/2021 09:27 AM   Modules accepted: Orders

## 2021-08-26 ENCOUNTER — Ambulatory Visit (INDEPENDENT_AMBULATORY_CARE_PROVIDER_SITE_OTHER): Payer: 59 | Admitting: Endocrinology

## 2021-08-26 ENCOUNTER — Encounter: Payer: Self-pay | Admitting: Endocrinology

## 2021-08-26 ENCOUNTER — Other Ambulatory Visit: Payer: Self-pay

## 2021-08-26 VITALS — BP 138/70 | HR 118 | Ht 64.0 in | Wt 159.0 lb

## 2021-08-26 DIAGNOSIS — E0590 Thyrotoxicosis, unspecified without thyrotoxic crisis or storm: Secondary | ICD-10-CM

## 2021-08-26 DIAGNOSIS — E041 Nontoxic single thyroid nodule: Secondary | ICD-10-CM

## 2021-08-26 LAB — T4, FREE: Free T4: 0.85 ng/dL (ref 0.60–1.60)

## 2021-08-26 LAB — TSH: TSH: 0.28 u[IU]/mL — ABNORMAL LOW (ref 0.35–5.50)

## 2021-08-26 NOTE — Patient Instructions (Signed)
Let's recheck the ultrasound.  Please do in June.  you will receive a phone call, about a day and time for an appointment.   ?Blood tests are requested for you today.  We'll let you know about the results.   ?Please come back for a follow-up appointment in 6 months.   ?

## 2021-08-26 NOTE — Progress Notes (Signed)
? ?Subjective:  ? ? Patient ID: Sherry Ray, female    DOB: 1980-07-13, 41 y.o.   MRN: [id number] ? ?HPI ?Pt returns for f/u of hyperthyroidism (dx'ed early 2022; rx was deferred, due mild severity, and lactation; [place] in 2022 showed RUP nodule--1 year f/u advised).  pt states she feels well in general. She is still breast feeding.  She is not at risk for another pregnancy.   ?Past Medical History:  ?Diagnosis Date  ? Antithrombin deficiency ([place])   ? had a PE (2008) and DVT (1996), took coumadin for 1 year  ? Anxiety and depression   ? Depression   ? DVT (deep venous thrombosis) ([place])   ? Headache   ? History of cholestasis during pregnancy   ? Pulmonary embolism ([place])   ? Vaginal Pap smear, abnormal   ? ? ?Past Surgical History:  ?Procedure Laterality Date  ? CESAREAN SECTION    ? CESAREAN SECTION N/A 12/31/2019  ? Procedure: CESAREAN SECTION;  Surgeon: Everlene Farrier, MD;  Location: [facility];  Service: Obstetrics;  Laterality: N/ANira Conn, RNFA  ? CHOLECYSTECTOMY N/A 03/31/2020  ? Procedure: LAPAROSCOPIC CHOLECYSTECTOMY;  Surgeon: Dwan Bolt, MD;  Location: [facility];  Service: General;  Laterality: N/A;  ? ESOPHAGOGASTRODUODENOSCOPY ENDOSCOPY    ? EYE SURGERY Left 04/30/2020  ? Corneal cross linking  ? FIRST RIB REMOVAL    ? LEEP    ? NASAL SINUS SURGERY    ? ? ?Social History  ? ?Socioeconomic History  ? Marital status: Married  ?  Spouse name: Not on file  ? Number of children: Not on file  ? Years of education: Not on file  ? Highest education level: Not on file  ?Occupational History  ? Not on file  ?Tobacco Use  ? Smoking status: Never  ? Smokeless tobacco: Never  ?Vaping Use  ? Vaping Use: Never used  ?Substance and Sexual Activity  ? Alcohol use: No  ?  Alcohol/week: 0.0 standard drinks  ? Drug use: No  ? Sexual activity: Yes  ?  Birth control/protection: None  ?Other Topics Concern  ? Not on file  ?Social History Narrative  ? Not on file  ? ?Social Determinants of Health  ? ?Financial Resource  Strain: Not on file  ?Food Insecurity: Not on file  ?Transportation Needs: Not on file  ?Physical Activity: Not on file  ?Stress: Not on file  ?Social Connections: Not on file  ?Intimate Partner Violence: Not on file  ? ? ?Current Outpatient Medications on File Prior to Visit  ?Medication Sig Dispense Refill  ? benzonatate (TESSALON) 100 MG capsule Take 1 capsule (100 mg total) by mouth 3 (three) times daily as needed. 30 capsule 0  ? cetirizine (ZYRTEC) 10 MG tablet Take 10 mg by mouth daily.     ? ibuprofen (ADVIL) 600 MG tablet Take 1 tablet (600 mg total) by mouth every 6 (six) hours as needed for mild pain, moderate pain or cramping. 30 tablet 0  ? levonorgestrel (MIRENA) 20 MCG/24HR IUD 1 each by Intrauterine route once.    ? sertraline (ZOLOFT) 100 MG tablet Take 1 tablet (100 mg total) by mouth daily. 90 tablet 3  ? ?No current facility-administered medications on file prior to visit.  ? ? ?No Known Allergies ? ?Family History  ?Problem Relation Age of Onset  ? Cancer Father   ? Thyroid disease Neg Hx   ? ? ?BP 138/70 (BP Location: Left Arm, Patient Position: Sitting,  Cuff Size: Normal)   Pulse (!) 118   Ht '5\' 4"'$  (1.626 m)   Wt 159 lb (72.1 kg)   SpO2 96%   BMI 27.29 kg/m?  ? ? ?Review of Systems ? ?   ?Objective:  ? Physical Exam ?VITAL SIGNS:  See vs page.   ?GENERAL: no distress.   ?NECK: thyroid is 2-3 times normal size--diffuse.   ? ? ?Lab Results  ?Component Value Date  ? TSH 0.28 (L) 08/26/2021  ? T4TOTAL 7.8 07/07/2020  ? ?   ?Assessment & Plan:  ?Hypothyroidism: mild.   Plan is to follow.  ?Thyr nodule: f/u US is ordered today. ? ?

## 2021-08-31 ENCOUNTER — Ambulatory Visit (HOSPITAL_BASED_OUTPATIENT_CLINIC_OR_DEPARTMENT_OTHER): Payer: 59

## 2021-10-23 ENCOUNTER — Encounter: Payer: Self-pay | Admitting: Family

## 2021-10-23 ENCOUNTER — Telehealth (INDEPENDENT_AMBULATORY_CARE_PROVIDER_SITE_OTHER): Payer: 59 | Admitting: Family

## 2021-10-23 VITALS — Ht 64.0 in

## 2021-10-23 DIAGNOSIS — E041 Nontoxic single thyroid nodule: Secondary | ICD-10-CM

## 2021-10-23 DIAGNOSIS — J019 Acute sinusitis, unspecified: Secondary | ICD-10-CM

## 2021-10-23 DIAGNOSIS — Z6827 Body mass index (BMI) 27.0-27.9, adult: Secondary | ICD-10-CM | POA: Diagnosis not present

## 2021-10-23 DIAGNOSIS — E663 Overweight: Secondary | ICD-10-CM

## 2021-10-23 MED ORDER — FLUTICASONE PROPIONATE 50 MCG/ACT NA SUSP: 2.0000 | Freq: Every day | NASAL | 6 refills | Status: DC

## 2021-10-23 MED ORDER — FLUCONAZOLE 150 MG PO TABS: ORAL_TABLET | ORAL | 0 refills | Status: DC

## 2021-10-23 MED ORDER — AMOXICILLIN-POT CLAVULANATE 875-125 MG PO TABS: 1.0000 | ORAL_TABLET | Freq: Two times a day (BID) | ORAL | 0 refills | Status: AC

## 2021-10-23 NOTE — Progress Notes (Signed)
?Sherry Ray is a 41 y.o. female with the following history as recorded in EpicCare:  ?Patient Active Problem List  ? Diagnosis Date Noted  ? Thyroid nodule 08/26/2021  ? Hyperthyroidism 08/19/2020  ? History of cesarean section 12/31/2019  ? Term pregnancy 12/31/2019  ? Upper respiratory tract infection 07/05/2019  ? Antithrombin deficiency ([place]) 07/14/2016  ? Depression with anxiety 12/17/2015  ?  ?Current Outpatient Medications  ?Medication Sig Dispense Refill  ? amoxicillin-clavulanate (AUGMENTIN) 875-125 MG tablet Take 1 tablet by mouth 2 (two) times daily for 10 days. 20 tablet 0  ? cetirizine (ZYRTEC) 10 MG tablet Take 10 mg by mouth daily.     ? fluconazole (DIFLUCAN) 150 MG tablet Take 1 tablet as directed; repeat after 72 hours 2 tablet 0  ? fluticasone (FLONASE) 50 MCG/ACT nasal spray Place 2 sprays into both nostrils daily. 16 g 6  ? ibuprofen (ADVIL) 600 MG tablet Take 1 tablet (600 mg total) by mouth every 6 (six) hours as needed for mild pain, moderate pain or cramping. 30 tablet 0  ? levonorgestrel (MIRENA) 20 MCG/24HR IUD 1 each by Intrauterine route once.    ? sertraline (ZOLOFT) 100 MG tablet Take 1 tablet (100 mg total) by mouth daily. 90 tablet 3  ? benzonatate (TESSALON) 100 MG capsule Take 1 capsule (100 mg total) by mouth 3 (three) times daily as needed. (Patient not taking: Reported on 10/23/2021) 30 capsule 0  ? ?No current facility-administered medications for this visit.  ?  ?Allergies: Patient has no known allergies.  ?Past Medical History:  ?Diagnosis Date  ? Antithrombin deficiency ([place])   ? had a PE (2008) and DVT (1996), took coumadin for 1 year  ? Anxiety and depression   ? Depression   ? DVT (deep venous thrombosis) ([place])   ? Headache   ? History of cholestasis during pregnancy   ? Pulmonary embolism ([place])   ? Vaginal Pap smear, abnormal   ?  ?Past Surgical History:  ?Procedure Laterality Date  ? CESAREAN SECTION    ? CESAREAN SECTION N/A 12/31/2019  ? Procedure: CESAREAN  SECTION;  Surgeon: Everlene Farrier, MD;  Location: [facility];  Service: Obstetrics;  Laterality: N/ANira Conn, RNFA  ? CHOLECYSTECTOMY N/A 03/31/2020  ? Procedure: LAPAROSCOPIC CHOLECYSTECTOMY;  Surgeon: Dwan Bolt, MD;  Location: [facility];  Service: General;  Laterality: N/A;  ? ESOPHAGOGASTRODUODENOSCOPY ENDOSCOPY    ? EYE SURGERY Left 04/30/2020  ? Corneal cross linking  ? FIRST RIB REMOVAL    ? LEEP    ? NASAL SINUS SURGERY    ?  ?Family History  ?Problem Relation Age of Onset  ? Cancer Father   ? Thyroid disease Neg Hx   ?  ?Social History  ? ?Tobacco Use  ? Smoking status: Never  ? Smokeless tobacco: Never  ?Substance Use Topics  ? Alcohol use: No  ?  Alcohol/week: 0.0 standard drinks  ?  ?Subjective:  ? ? ?I connected with Sherry Ray on 10/23/21 at 10:00 AM EDT by a video enabled telemedicine application and verified that I am speaking with the correct person using two identifiers. ?  ?I discussed the limitations of evaluation and management by telemedicine and the availability of in person appointments. The patient expressed understanding and agreed to proceed. Provider in office/ patient is at home; provider and patient are only 2 people on video call.  ? ?COVID 3 weeks ago- "just can't shake it." +sinus pressure/ fatigue/ congestion; did have  migraine recently ( may get once per year);  ? ?Would like to discuss referral to nutritionist;  ? ? ? ?Objective:  ?Vitals:  ? 10/23/21 0944  ?Height: '5\' 4"'$  (1.626 m)  ?  ?General: Well developed, well nourished, in no acute distress  ?Skin : Warm and dry.  ?Head: Normocephalic and atraumatic  ?Lungs: Respirations unlabored;  ?Neurologic: Alert and oriented; speech intact; face symmetrical; moves all extremities well; CNII-XII intact without focal deficit  ? ?Assessment:  ?1. Acute sinusitis, recurrence not specified, unspecified location   ?2. Thyroid nodule   ?3. BMI 27.0-27.9,adult   ?  ?Plan:  ?Rx for Augmentin 875 mg bid x 10 days; Rx for Flonase and  use her Zyrtec; increase fluids, rest and follow up worse, no better; ?Due for repeat U/S in July- she will reach out to get updated order; will establish with new endocrinologist later this year; ?Refer to nutrition per patient request;  ? ? ?No follow-ups on file.  ?No orders of the defined types were placed in this encounter. ?  ?Requested Prescriptions  ? ?Signed Prescriptions Disp Refills  ? amoxicillin-clavulanate (AUGMENTIN) 875-125 MG tablet 20 tablet 0  ?  Sig: Take 1 tablet by mouth 2 (two) times daily for 10 days.  ? fluticasone (FLONASE) 50 MCG/ACT nasal spray 16 g 6  ?  Sig: Place 2 sprays into both nostrils daily.  ? fluconazole (DIFLUCAN) 150 MG tablet 2 tablet 0  ?  Sig: Take 1 tablet as directed; repeat after 72 hours  ?  ? ? ?

## 2021-12-15 ENCOUNTER — Telehealth: Payer: 59 | Admitting: Physician Assistant

## 2021-12-15 DIAGNOSIS — J029 Acute pharyngitis, unspecified: Secondary | ICD-10-CM

## 2021-12-15 MED ORDER — PREDNISONE 20 MG PO TABS: 40.0000 mg | ORAL_TABLET | Freq: Every day | ORAL | 0 refills | Status: AC

## 2021-12-15 MED ORDER — LIDOCAINE VISCOUS HCL 2 % MT SOLN: 15.0000 mL | OROMUCOSAL | 0 refills | Status: DC | PRN

## 2021-12-15 NOTE — Progress Notes (Signed)
E-Visit for Sore Throat  We are sorry that you are not feeling well.  Here is how we plan to help!  Your symptoms indicate a likely viral infection (Pharyngitis).   Pharyngitis is inflammation in the back of the throat which can cause a sore throat, scratchiness and sometimes difficulty swallowing.   Pharyngitis is typically caused by a respiratory virus and will just run its course.  Please keep in mind that your symptoms could last up to 10 days.  For throat pain, we recommend over the counter oral pain relief medications such as acetaminophen or aspirin, or anti-inflammatory medications such as ibuprofen or naproxen sodium.  Topical treatments such as oral throat lozenges or sprays may be used as needed.  Avoid close contact with loved ones, especially the very young and elderly.  Remember to wash your hands thoroughly throughout the day as this is the number one way to prevent the spread of infection and wipe down door knobs and counters with disinfectant.  After careful review of your answers, I would not recommend an antibiotic for your condition.  Antibiotics should not be used to treat conditions that we suspect are caused by viruses like the virus that causes the common cold or flu. However, some people can have Strep with atypical symptoms. You may need formal testing in clinic or office to confirm if your symptoms continue or worsen.  Providers prescribe antibiotics to treat infections caused by bacteria. Antibiotics are very powerful in treating bacterial infections when they are used properly.  To maintain their effectiveness, they should be used only when necessary.  Overuse of antibiotics has resulted in the development of super bugs that are resistant to treatment!    I know you mentioned you are breastfeeding currently. Are you solely breastfeeding or can you supplement with formula? Just asking as I can add on a short course of steroid to reduce throat inflammation and glandular  swelling/tenderness if you are able to "pump and dump".   Home Care: Only take medications as instructed by your medical team. Do not drink alcohol while taking these medications. A steam or ultrasonic humidifier can help congestion.  You can place a towel over your head and breathe in the steam from hot water coming from a faucet. Avoid close contacts especially the very young and the elderly. Cover your mouth when you cough or sneeze. Always remember to wash your hands.  Get Help Right Away If: You develop worsening fever or throat pain. You develop a severe head ache or visual changes. Your symptoms persist after you have completed your treatment plan.  Make sure you Understand these instructions. Will watch your condition. Will get help right away if you are not doing well or get worse.   Thank you for choosing an e-visit.  Your e-visit answers were reviewed by a board certified advanced clinical practitioner to complete your personal care plan. Depending upon the condition, your plan could have included both over the counter or prescription medications.  Please review your pharmacy choice. Make sure the pharmacy is open so you can pick up prescription now. If there is a problem, you may contact your provider through [employer] and have the prescription routed to another pharmacy.  Your safety is important to [place]. If you have drug allergies check your prescription carefully.   For the next 24 hours you can use MyChart to ask questions about today's visit, request a non-urgent call back, or ask for a work or school excuse. You  will get an email in the next two days asking about your experience. I hope that your e-visit has been valuable and will speed your recovery.

## 2021-12-22 ENCOUNTER — Telehealth: Payer: Self-pay | Admitting: Family

## 2021-12-22 ENCOUNTER — Other Ambulatory Visit: Payer: Self-pay | Admitting: Family

## 2021-12-22 DIAGNOSIS — E041 Nontoxic single thyroid nodule: Secondary | ICD-10-CM

## 2021-12-22 NOTE — Telephone Encounter (Signed)
-----   Message from Marrian Salvage, [place] sent at 10/23/2021 10:05 AM EDT ----- Make sure thyroid ultrasound scheduled/ done

## 2021-12-22 NOTE — Telephone Encounter (Signed)
Please remind her that her thyroid ultrasound is due; her endocrinologist had ordered in March 2023- just wanted to clarify if she needed help getting this scheduled.

## 2021-12-22 NOTE — Telephone Encounter (Signed)
Called pt and pt stated would like  referral to one,  One she seen had retired

## 2021-12-30 NOTE — Progress Notes (Signed)
I have spent 5 minutes in review of e-visit questionnaire, review and updating patient chart, medical decision making and response to patient.   Marshaun Lortie Cody Janyia Guion, PA-C    

## 2022-01-01 ENCOUNTER — Telehealth: Payer: 59 | Admitting: Physician Assistant

## 2022-01-01 DIAGNOSIS — H60392 Other infective otitis externa, left ear: Secondary | ICD-10-CM

## 2022-01-01 MED ORDER — NEOMYCIN-POLYMYXIN-HC 3.5-10000-1 OT SOLN: 3.0000 [drp] | Freq: Four times a day (QID) | OTIC | 0 refills | Status: DC

## 2022-01-01 NOTE — Progress Notes (Signed)

## 2022-01-05 ENCOUNTER — Ambulatory Visit
Admission: RE | Admit: 2022-01-05 | Discharge: 2022-01-05 | Disposition: A | Discharge status: Home / Self Care | Payer: 59 | Source: Ambulatory Visit | Attending: Family | Admitting: Family

## 2022-01-05 DIAGNOSIS — E041 Nontoxic single thyroid nodule: Secondary | ICD-10-CM

## 2022-01-14 ENCOUNTER — Ambulatory Visit (INDEPENDENT_AMBULATORY_CARE_PROVIDER_SITE_OTHER): Payer: 59 | Admitting: Family

## 2022-01-14 VITALS — BP 110/70 | HR 94 | Temp 97.9°F | Resp 18 | Ht 64.0 in | Wt 163.6 lb

## 2022-01-14 DIAGNOSIS — R062 Wheezing: Secondary | ICD-10-CM

## 2022-01-14 DIAGNOSIS — J020 Streptococcal pharyngitis: Secondary | ICD-10-CM | POA: Diagnosis not present

## 2022-01-14 MED ORDER — CEFDINIR 300 MG PO CAPS: 300.0000 mg | ORAL_CAPSULE | Freq: Two times a day (BID) | ORAL | 0 refills | Status: DC

## 2022-01-14 MED ORDER — PREDNISONE 20 MG PO TABS: 20.0000 mg | ORAL_TABLET | Freq: Every day | ORAL | 0 refills | Status: DC

## 2022-01-14 NOTE — Progress Notes (Signed)
Sherry Ray is a 41 y.o. female with the following history as recorded in Epic[facility]:  Patient Active Problem List   Diagnosis Date Noted   Thyroid nodule 08/26/2021   Hyperthyroidism 08/19/2020   History of cesarean section 12/31/2019   Term pregnancy 12/31/2019   Upper respiratory tract infection 07/05/2019   Antithrombin deficiency ([place]) 07/14/2016   Depression with anxiety 12/17/2015    Current Outpatient Medications  Medication Sig Dispense Refill   cetirizine (ZYRTEC) 10 MG tablet Take 10 mg by mouth daily.      fluticasone (FLONASE) 50 MCG/ACT nasal spray Place 2 sprays into both nostrils daily. 16 g 6   ibuprofen (ADVIL) 600 MG tablet Take 1 tablet (600 mg total) by mouth every 6 (six) hours as needed for mild pain, moderate pain or cramping. 30 tablet 0   levonorgestrel (MIRENA) 20 MCG/24HR IUD 1 each by Intrauterine route once.     lidocaine (XYLOCAINE) 2 % solution Use as directed 15 mLs in the mouth or throat as needed for mouth pain. 200 mL 0   neomycin-polymyxin-hydrocortisone (CORTISPORIN) OTIC solution Place 3 drops into the left ear 4 (four) times daily. X 7days 10 mL 0   sertraline (ZOLOFT) 100 MG tablet Take 1 tablet (100 mg total) by mouth daily. 90 tablet 3   cefdinir (OMNICEF) 300 MG capsule Take 1 capsule (300 mg total) by mouth 2 (two) times daily. 20 capsule 0   predniSONE (DELTASONE) 20 MG tablet Take 1 tablet (20 mg total) by mouth daily with breakfast. 5 tablet 0   No current facility-administered medications for this visit.    Allergies: Patient has no known allergies.  Past Medical History:  Diagnosis Date   Antithrombin deficiency ([place])    had a PE (2008) and DVT (1996), took coumadin for 1 year   Anxiety and depression    Depression    DVT (deep venous thrombosis) ([name])    Headache    History of cholestasis during pregnancy    Pulmonary embolism ([place])    Vaginal Pap smear, abnormal     Past Surgical History:  Procedure Laterality Date    CESAREAN SECTION     CESAREAN SECTION N/A 12/31/2019   Procedure: CESAREAN SECTION;  Surgeon: Everlene Farrier, MD;  Location: [facility];  Service: Obstetrics;  Laterality: N/ANira Conn, RNFA   CHOLECYSTECTOMY N/A 03/31/2020   Procedure: LAPAROSCOPIC CHOLECYSTECTOMY;  Surgeon: Dwan Bolt, MD;  Location: [place];  Service: General;  Laterality: N/A;   ESOPHAGOGASTRODUODENOSCOPY ENDOSCOPY     EYE SURGERY Left 04/30/2020   Corneal cross linking   FIRST RIB REMOVAL     LEEP     NASAL SINUS SURGERY      Family History  Problem Relation Age of Onset   Cancer Father    Thyroid disease Neg Hx     Social History   Tobacco Use   Smoking status: Never   Smokeless tobacco: Never  Substance Use Topics   Alcohol use: No    Alcohol/week: 0.0 standard drinks of alcohol    Subjective:   Positive strep test; [facility] at [facility] and started on Amoxicillin 500 mg tid x 10 days; concerned about lingering symptoms; + headache; does not feel she has responded well to Amoxicillin at all;     Objective:  Vitals:   01/14/22 1506  BP: 110/70  Pulse: 94  Resp: 18  Temp: 97.9 F (36.6 C)  TempSrc: Temporal  SpO2: 97%  Weight: 163 lb  9.6 oz (74.2 kg)  Height: '5\' 4"'$  (1.626 m)    General: Well developed, well nourished, in no acute distress  Skin : Warm and dry.  Head: Normocephalic and atraumatic  Eyes: Sclera and conjunctiva clear; pupils round and reactive to light; extraocular movements intact  Ears: External normal; canals clear; tympanic membranes normal  Oropharynx: Pink, supple. No suspicious lesions  Neck: Supple without thyromegaly, adenopathy  Lungs: Respirations unlabored; clear to auscultation bilaterally without wheeze, rales, rhonchi  CVS exam: normal rate and regular rhythm.  Neurologic: Alert and oriented; speech intact; face symmetrical; moves all extremities well; CNII-XII intact without focal deficit   Assessment:  1. Strep throat   2. Wheezing     Plan:  D/C  Amoxicillin- change to Omnicef 300 mg bid x 10 days; Rx for Prednisone 20 mg qd x 5 days; increase fluids, rest and follow up worse, no better.   No follow-ups on file.  No orders of the defined types were placed in this encounter.   Requested Prescriptions   Signed Prescriptions Disp Refills   cefdinir (OMNICEF) 300 MG capsule 20 capsule 0    Sig: Take 1 capsule (300 mg total) by mouth 2 (two) times daily.   predniSONE (DELTASONE) 20 MG tablet 5 tablet 0    Sig: Take 1 tablet (20 mg total) by mouth daily with breakfast.

## 2022-01-14 NOTE — Progress Notes (Signed)
Patient ID: Sherry Ray, female   DOB: [birth year], 41 y.o.   MRN: [id number]  HPI  Sherry Ray is a 41 y.o.-year-old female, returning for follow-up for thyrotoxicosis and thyroid nodules.  She previously saw Dr. Loanne Drilling, last visit 4 months ago.  She has strep throat >> prev. On Amoxicillin >> now on Omnicef + Prednisone 20 mg starting today.  I reviewed pt's thyroid tests: Lab Results  Component Value Date   TSH 0.28 (L) 08/26/2021   TSH 0.35 02/24/2021   TSH 0.16 (L) 10/20/2020   TSH 0.27 (L) 08/19/2020   TSH 0.19 (L) 07/07/2020   TSH 0.22 (L) 07/02/2020   TSH 0.584 06/06/2018   TSH 0.716 07/14/2016   FREET4 0.85 08/26/2021   FREET4 0.88 02/24/2021   FREET4 0.74 10/20/2020   FREET4 0.76 08/19/2020  No results found for: "T3FREE"  Antithyroid antibodies: No results found for: "TSI"  Thyroid U/S (11/20/2020): Parenchymal Echotexture: Mildly heterogenous  Isthmus: 0.5 cm  Right lobe: 6.8 x 2.3 x 3.0 cm  Left lobe: 6.5 x 1.7 x 2.2 cm _________________________________________________________   Nodule # 1:  Location: Right; Superior  Maximum size: 1.2 cm; Other 2 dimensions: 1.0 x 0.7 cm  Composition: solid/almost completely solid (2)  Echogenicity: hypoechoic (2)  *Given size (>/= 1 - 1.4 cm) and appearance, a follow-up ultrasound in 1 year should be considered based on TI-RADS criteria.  _________________________________________________________   Nodule # 2:  Location: Right; Inferior  Maximum size: 1.2 cm; Other 2 dimensions: 1.1 x 0.6 cm  Composition: solid/almost completely solid (2)  Echogenicity: isoechoic (1)  Given size (<1.4 cm) and appearance, this nodule does NOT meet TI-RADS criteria for biopsy or dedicated follow-up.  _________________________________________________________   IMPRESSION: 1. Mildly enlarged, mildly heterogeneous thyroid gland. 2. Solid nodule in the right superior thyroid (labeled 1, 1.2 cm) meets criteria (TI-RADS category  4) for 1 year ultrasound surveillance.   Thyroid ultrasound (01/05/2022): Parenchymal Echotexture: Mildly heterogenous  Isthmus: 0.5 cm  Right lobe: 6.0 x 2.0 x 2.4 cm  Left lobe: 5.6 x 1.7 x 1.9 cm  _________________________________________________________   Nodule # 1: The previously identified 1.2 cm nodule in the right mid to upper gland has partially involuted since the prior study and now measures only 0.9 x 0.9 x 0.7 cm. This nodule no longer meets criteria to warrant further follow-up.   Nodule # 2:  Prior biopsy: No  Location: Right; Inferior  Maximum size: 1.2 cm; Other 2 dimensions: 1.1 x 0.6 cm, previously, 1.2 x 1.1 x 0.6 cm  Composition: solid/almost completely solid (2)  Echogenicity: hypoechoic (2) Change in features: Yes  Change in ACR TI-RADS risk category: Yes  *Given size (>/= 1 - 1.4 cm) and appearance, a follow-up ultrasound in 1 year should be considered based on TI-RADS criteria.  _________________________________________________________   No new nodules or suspicious features.   IMPRESSION: 1. Interval involution of the previously identified TI-RADS category 4 nodule in the right mid to upper gland. This lesion no longer meets criteria to warrant further evaluation. 2. However, the TI-RADS category 3 nodule in the right inferior gland has become more hypoechoic in appearance and is now consistent with TI-RADS category 4 and meets criteria for imaging surveillance. Recommend follow-up ultrasound in 1 year.  Pt denies: - feeling nodules in neck - hoarseness - dysphagia - choking - SOB with lying down  She denies: - excessive sweating/heat intolerance - anxiety - palpitations - hyperdefecation - weight loss - has weight  gain (approximately 10 pounds in the last year)  She has: - hair loss - Tremors  Pt does not have a FH of thyroid ds. No FH of thyroid cancer. No h/o radiation tx to head or neck. No seaweed or kelp, no recent contrast  studies. No herbal supplements. No Biotin use.  Pt. also has a history of Antithrombin deficiency with history of DVT and PE, headaches.  She gave birth in 2019, 12/2019.  ROS:  + see HPI  Past Medical History:  Diagnosis Date   Antithrombin deficiency ([place])    had a PE (2008) and DVT (1996), took coumadin for 1 year   Anxiety and depression    Depression    DVT (deep venous thrombosis) ([name])    Headache    History of cholestasis during pregnancy    Pulmonary embolism ([place])    Vaginal Pap smear, abnormal    Past Surgical History:  Procedure Laterality Date   CESAREAN SECTION     CESAREAN SECTION N/A 12/31/2019   Procedure: CESAREAN SECTION;  Surgeon: Everlene Farrier, MD;  Location: [facility];  Service: Obstetrics;  Laterality: N/ANira Ray, RNFA   CHOLECYSTECTOMY N/A 03/31/2020   Procedure: LAPAROSCOPIC CHOLECYSTECTOMY;  Surgeon: Dwan Bolt, MD;  Location: [facility];  Service: General;  Laterality: N/A;   ESOPHAGOGASTRODUODENOSCOPY ENDOSCOPY     EYE SURGERY Left 04/30/2020   Corneal cross linking   FIRST RIB REMOVAL     LEEP     NASAL SINUS SURGERY     Social History   Socioeconomic History   Marital status: Married    Spouse name: Not on file   Number of children: Not on file   Years of education: Not on file   Highest education level: Not on file  Occupational History   Not on file  Tobacco Use   Smoking status: Never   Smokeless tobacco: Never  Vaping Use   Vaping Use: Never used  Substance and Sexual Activity   Alcohol use: No    Alcohol/week: 0.0 standard drinks of alcohol   Drug use: No   Sexual activity: Yes    Birth control/protection: None  Other Topics Concern   Not on file  Social History Narrative   Not on file   Social Determinants of Health   Financial Resource Strain: Not on file  Food Insecurity: Not on file  Transportation Needs: Not on file  Physical Activity: Not on file  Stress: Not on file  Social Connections: Not on file   Intimate Partner Violence: Not on file   Current Outpatient Medications on File Prior to Visit  Medication Sig Dispense Refill   cefdinir (OMNICEF) 300 MG capsule Take 1 capsule (300 mg total) by mouth 2 (two) times daily. 20 capsule 0   cetirizine (ZYRTEC) 10 MG tablet Take 10 mg by mouth daily.      fluticasone (FLONASE) 50 MCG/ACT nasal spray Place 2 sprays into both nostrils daily. 16 g 6   ibuprofen (ADVIL) 600 MG tablet Take 1 tablet (600 mg total) by mouth every 6 (six) hours as needed for mild pain, moderate pain or cramping. 30 tablet 0   levonorgestrel (MIRENA) 20 MCG/24HR IUD 1 each by Intrauterine route once.     lidocaine (XYLOCAINE) 2 % solution Use as directed 15 mLs in the mouth or throat as needed for mouth pain. 200 mL 0   neomycin-polymyxin-hydrocortisone (CORTISPORIN) OTIC solution Place 3 drops into the left ear 4 (four) times daily. X 7days  10 mL 0   predniSONE (DELTASONE) 20 MG tablet Take 1 tablet (20 mg total) by mouth daily with breakfast. 5 tablet 0   sertraline (ZOLOFT) 100 MG tablet Take 1 tablet (100 mg total) by mouth daily. 90 tablet 3   No current facility-administered medications on file prior to visit.   No Known Allergies Family History  Problem Relation Age of Onset   Cancer Father    Thyroid disease Neg Hx    PE: BP 120/74 (BP Location: Right Arm, Patient Position: Sitting, Cuff Size: Normal)   Pulse 96   Ht 5' 4" (1.626 m)   Wt 164 lb 12.8 oz (74.8 kg)   SpO2 96%   BMI 28.29 kg/m  Wt Readings from Last 3 Encounters:  01/15/22 164 lb 12.8 oz (74.8 kg)  01/14/22 163 lb 9.6 oz (74.2 kg)  08/26/21 159 lb (72.1 kg)   Constitutional: normal weight, in NAD Eyes: EOMI, no exophthalmos, no lid lag, no stare ENT: moist mucous membranes, no thyromegaly, no thyroid bruits, no cervical lymphadenopathy Cardiovascular: tachycardia, RR, No MRG Respiratory: CTA B Gastrointestinal: abdomen soft, NT, ND, BS+ Musculoskeletal: no deformities Skin: moist,  warm, no rashes Neurological: no tremor with outstretched hands, DTR normal in all 4  ASSESSMENT: 1.  Subclinical thyrotoxicosis  2.  Thyroid nodules  PLAN:  1. Patient with a history of mildly low TSH, with normal free T4, with tremors, but without other thyrotoxic sxs: No weight loss, heat intolerance, hyperdefecation, palpitations, anxiety.  - she does not appear to have exogenous causes for the low TSH.  - We discussed that possible causes of thyrotoxicosis are:  Graves ds   Thyroiditis -including postpartum thyroiditis -she gave birth few months prior to the tests becoming abnormal toxic multinodular goiter/ toxic adenoma -she does have 2 thyroid nodules, which were followed (see below) - will check the TSH, fT3 and fT4 and also add thyroid stimulating antibodies to screen for Graves' disease.  However, because she just started prednisone today, we cannot check these labs now.  I advised her to come back for labs 2 weeks after finishing her prednisone course. - If the tests remain abnormal, we may need an uptake and scan to differentiate between the 3 above possible etiologies  - possible modalities of treatment for the above conditions are: methimazole use, radioactive iodine ablation or (last resort) surgery. - I do not feel that we need to add beta blockers at this time.  She is tachycardic, but she rushed over here due to being late. - RTC in 6 months, but likely sooner for repeat labs  2.  Thyroid nodules -Patient has 2 small thyroid nodules, which were initially evaluated by ultrasound in 11/2020.  There was no need for biopsy, but one of the nodules, the right superior one, met criteria for 1 year follow-up.  She had another, right inferior nodule, which was low risk and for which no follow-up was needed.  She had a repeat thyroid ultrasound on 01/05/2022 and this showed that the right superior/mid thyroid nodule involuted, and now did not measure more than 0.9 cm.  No follow-up was  needed for this nodule.  However, the right inferior nodule appeared now hypoechoic, while previously it was isoechoic.  Therefore, a follow-up in 1 year was recommended. -At today's visit I reviewed her thyroid ultrasound results along with the patient and we discussed about possible worrisome characteristics for thyroid nodules to include: Microcalcifications, internal blood flow, invaginations in the surrounding tissue and taller  than wide distribution.  The only characteristic that her nodules display was hypo echogenicity -At this visit, she does not have neck compression symptoms. -No neck masses are palpated in the neck today -Plan to get another thyroid ultrasound in a year -We did discuss that her thyroid scan would be beneficial to see which of these nodules might be toxic.  Toxic nodules are rarely malignant, so we may not need to follow them up with ultrasounds if they are overproducing hormones.  - Total time spent for the visit: 40 min, in precharting, reviewing Dr. Cordelia Pen last note, obtaining medical information from the chart and from the pt, reviewing her  previous labs, evaluations, and treatments, reviewing her symptoms, counseling her about her 2 thyroid conditions (please see the discussed topics above), and developing a plan to further evaluate and treat them.  Philemon Kingdom, MD PhD [facility] Endocrinology

## 2022-01-15 ENCOUNTER — Ambulatory Visit (INDEPENDENT_AMBULATORY_CARE_PROVIDER_SITE_OTHER): Payer: 59 | Admitting: Internal Medicine

## 2022-01-15 ENCOUNTER — Encounter: Payer: Self-pay | Admitting: Internal Medicine

## 2022-01-15 VITALS — BP 120/74 | HR 96 | Ht 64.0 in | Wt 164.8 lb

## 2022-01-15 DIAGNOSIS — E0590 Thyrotoxicosis, unspecified without thyrotoxic crisis or storm: Secondary | ICD-10-CM

## 2022-01-15 DIAGNOSIS — E042 Nontoxic multinodular goiter: Secondary | ICD-10-CM

## 2022-01-15 NOTE — Patient Instructions (Signed)
Let's have you come back for labs 2 weeks after you finish Prednisone.  You should have an endocrinology follow-up appointment in 6 months.

## 2022-01-20 ENCOUNTER — Encounter: Payer: 59 | Attending: Internal Medicine | Admitting: Registered"

## 2022-01-20 ENCOUNTER — Encounter: Payer: Self-pay | Admitting: Registered"

## 2022-01-20 DIAGNOSIS — E663 Overweight: Secondary | ICD-10-CM | POA: Diagnosis present

## 2022-01-20 NOTE — Patient Instructions (Signed)
On the weekend make a plan to help with preparing balanced meals during the week. Try to consume a more balanced breakfast including a carbohydrate, protein, and/or fruit/vegetable, this will keep you sustained throughout the morning. Create a night-time routine. Utilize the time before bed to relax, wind down, and prepare for a good night's rest. Avoid using your phone during this time. Adequate rest helps with stress and food choices.  2-3x/week - When you get tired at work, try to stand up and walk around, and bring a snack such as fruit to have on hand.  Aim to consume more water. Add lemon or lime slices, flavor packets, or drink more seltzer waters. You can look up recipes for infused waters.  Ask the babysitter if they can give the children a snack later in the afternoon so that you feel less rushed at dinner.

## 2022-01-20 NOTE — Progress Notes (Signed)
Medical Nutrition Therapy  Appointment Start time:  [phone]  Appointment End time:  [id number]  Primary concerns today: She states that she does not have good eating habits and after her 2 pregnancy's she feels as though her eating habits need to change. She states her weight has been creeping up and feels as though she needs to lose 15-20 lbs.   Referral diagnosis: E66.3 (ICD-10-CM) - Overweight (BMI 25.0-29.9) Preferred learning style: no preference Learning readiness: ready  NUTRITION ASSESSMENT  Anthropometrics  Wt Readings from Last 3 Encounters:  01/20/22 164 lb 8 oz (74.6 kg)  01/15/22 164 lb 12.8 oz (74.8 kg)  01/14/22 163 lb 9.6 oz (74.2 kg)      Clinical Medical Hx: hyperthyroidism, anxiety, depression Medications: reviewed Labs: reviewed Notable Signs/Symptoms: weight gain  Lifestyle & Dietary Hx Pt reports she grows a garden for flowers and herbs and enjoys cooking fresh vegetables.  Diet:  Pt states her and her husband split the shopping/cooking. Breakfast - something quick while getting breakfast for kids. Dinner: stressful making a balanced meal due to children needing attention and being hungry.Pt reports choosing convenience foods.  Sleep:11pm-7am. Pt reports she has not been getting a full nights sleep due to young children needing attention.  Pt has a snack middle of the night, habit since a teenager. Pt states once she has a snack and not hungry is able to go back to sleep.   Physical activity: Pt has sedentary job. Pt reports co-workers walk and is considering taking activity breaks as well.  Estimated daily fluid intake: 60 oz Supplements: not assessed Sleep: ~8 hrs Stress / self-care: moderate stress Current average weekly physical activity: not assessed  24-Hr Dietary Recall First Meal: bowl of cereal (cinnamon toast crunch or special K) OR cheese toast Snack: cereal bar OR fig bar OR none Second Meal: leftovers OR fast food ([place] bacon egg and  cheese english muffin)  Snack: vending machine (chips or candy bar) Third Meal: roasted vegetables with chicken or pork OR frozen lasagna  Snack: none OR cookies Snack during night: fruit OR  Beverages: probiotic yogurt drink, 3-4 cups of coffee with 2 half & half in each, 2 cups 2% milk, orange juice, 1-2 cans seltzer water, 2 cups water. Alcohol: 1x/month  NUTRITION DIAGNOSIS  NB-1.1 Food and nutrition-related knowledge deficit As related to importance of eating balanced meals and snacks.  As evidenced by pt dietary recall including carb only breakfast/snacks.  NUTRITION INTERVENTION  Nutrition education (E-1) on the following topics:  MyPlate How to build a balanced meal Mindfulness when eating/snacking Sleep  Handouts Provided Include  Dish Up a Healthy Meal Sleep Hygiene  Learning Style & Readiness for Change Teaching method utilized: Visual & Auditory  Demonstrated degree of understanding via: Teach Back  Barriers to learning/adherence to lifestyle change: none indicated  Goals On the weekend make a plan to help with preparing balanced meals during the week. Try to consume a more balanced breakfast including a carbohydrate, protein, and/or fruit/vegetable, this will keep you sustained throughout the morning. Create a night-time routine. Utilize the time before bed to relax, wind down, and prepare for a good night's rest. Avoid using your phone during this time. Adequate rest helps with stress and food choices.  2-3x/week - When you get tired at work, try to stand up and walk around, and bring a snack such as fruit to have on hand.  Aim to consume more water. Add lemon or lime slices, flavor packets, or drink more  [place]. You can look up recipes for infused waters.  Ask the babysitter if they can give the children a snack later in the afternoon so that you feel less rushed at dinner.    MONITORING & EVALUATION Dietary intake, weekly physical activity, and follow-up  in 2 months.

## 2022-02-05 ENCOUNTER — Other Ambulatory Visit: Payer: Self-pay

## 2022-02-05 ENCOUNTER — Other Ambulatory Visit (INDEPENDENT_AMBULATORY_CARE_PROVIDER_SITE_OTHER): Payer: 59

## 2022-02-05 DIAGNOSIS — E0590 Thyrotoxicosis, unspecified without thyrotoxic crisis or storm: Secondary | ICD-10-CM | POA: Diagnosis not present

## 2022-02-09 LAB — T3, FREE: T3, Free: 3.8 pg/mL (ref 2.3–4.2)

## 2022-02-09 LAB — T4, FREE: Free T4: 1.2 ng/dL (ref 0.8–1.8)

## 2022-02-09 LAB — TSH: TSH: 0.22 mIU/L — ABNORMAL LOW

## 2022-02-09 LAB — THYROID STIMULATING IMMUNOGLOBULIN: TSI: <89 % baseline (ref <140)

## 2022-02-12 ENCOUNTER — Encounter: Payer: Self-pay | Admitting: Internal Medicine

## 2022-02-12 ENCOUNTER — Other Ambulatory Visit: Payer: Self-pay | Admitting: Internal Medicine

## 2022-02-12 DIAGNOSIS — E0590 Thyrotoxicosis, unspecified without thyrotoxic crisis or storm: Secondary | ICD-10-CM

## 2022-02-12 DIAGNOSIS — E042 Nontoxic multinodular goiter: Secondary | ICD-10-CM

## 2022-02-16 ENCOUNTER — Ambulatory Visit: Payer: 59 | Admitting: Family

## 2022-03-16 ENCOUNTER — Encounter: Payer: Self-pay | Admitting: Family

## 2022-03-16 ENCOUNTER — Ambulatory Visit (INDEPENDENT_AMBULATORY_CARE_PROVIDER_SITE_OTHER): Payer: 59 | Admitting: Family

## 2022-03-16 ENCOUNTER — Other Ambulatory Visit: Payer: Self-pay | Admitting: Family

## 2022-03-16 VITALS — BP 126/60 | HR 89 | Temp 98.6°F | Ht 64.0 in | Wt 165.0 lb

## 2022-03-16 DIAGNOSIS — J020 Streptococcal pharyngitis: Secondary | ICD-10-CM

## 2022-03-16 LAB — POCT RAPID STREP A (OFFICE): Rapid Strep A Screen: POSITIVE — AB

## 2022-03-16 MED ORDER — AMOXICILLIN-POT CLAVULANATE 875-125 MG PO TABS
1.0000 | ORAL_TABLET | Freq: Two times a day (BID) | ORAL | 0 refills | Status: AC
Start: 2022-03-16 — End: 2022-03-26

## 2022-03-16 MED ORDER — FLUCONAZOLE 150 MG PO TABS: ORAL_TABLET | ORAL | 1 refills | Status: DC

## 2022-03-16 NOTE — Progress Notes (Signed)
Sherry Ray is a 41 y.o. female with the following history as recorded in Epic[facility]:  Patient Active Problem List   Diagnosis Date Noted   Thyroid nodule 08/26/2021   Hyperthyroidism 08/19/2020   History of cesarean section 12/31/2019   Term pregnancy 12/31/2019   Upper respiratory tract infection 07/05/2019   Antithrombin deficiency ([place]) 07/14/2016   Depression with anxiety 12/17/2015    Current Outpatient Medications  Medication Sig Dispense Refill   amoxicillin-clavulanate (AUGMENTIN) 875-125 MG tablet Take 1 tablet by mouth 2 (two) times daily for 10 days. 20 tablet 0   cetirizine (ZYRTEC) 10 MG tablet Take 10 mg by mouth daily.      fluconazole (DIFLUCAN) 150 MG tablet Take 1 tablet as directed; repeat after 72 hours; 2 tablet 1   fluticasone (FLONASE) 50 MCG/ACT nasal spray Place 2 sprays into both nostrils daily. 16 g 6   ibuprofen (ADVIL) 600 MG tablet Take 1 tablet (600 mg total) by mouth every 6 (six) hours as needed for mild pain, moderate pain or cramping. 30 tablet 0   levonorgestrel (MIRENA) 20 MCG/24HR IUD 1 each by Intrauterine route once.     sertraline (ZOLOFT) 100 MG tablet Take 1 tablet (100 mg total) by mouth daily. 90 tablet 3   No current facility-administered medications for this visit.    Allergies: Patient has no known allergies.  Past Medical History:  Diagnosis Date   Antithrombin deficiency ([place])    had a PE (2008) and DVT (1996), took coumadin for 1 year   Anxiety and depression    Depression    DVT (deep venous thrombosis) ([name])    Headache    History of cholestasis during pregnancy    Pulmonary embolism ([place])    Vaginal Pap smear, abnormal     Past Surgical History:  Procedure Laterality Date   CESAREAN SECTION     CESAREAN SECTION N/A 12/31/2019   Procedure: CESAREAN SECTION;  Surgeon: Everlene Farrier, MD;  Location: [facility];  Service: Obstetrics;  Laterality: N/ANira Conn, RNFA   CHOLECYSTECTOMY N/A 03/31/2020   Procedure:  LAPAROSCOPIC CHOLECYSTECTOMY;  Surgeon: Dwan Bolt, MD;  Location: [facility];  Service: General;  Laterality: N/A;   ESOPHAGOGASTRODUODENOSCOPY ENDOSCOPY     EYE SURGERY Left 04/30/2020   Corneal cross linking   FIRST RIB REMOVAL     LEEP     NASAL SINUS SURGERY      Family History  Problem Relation Age of Onset   Cancer Father    Thyroid disease Neg Hx     Social History   Tobacco Use   Smoking status: Never   Smokeless tobacco: Never  Substance Use Topics   Alcohol use: No    Alcohol/week: 0.0 standard drinks of alcohol    Subjective:   Sore throat x 3-4 days; had strep throat in early August; pain is more noticeable on left side of throat/ left ear;  Children are not sick at this time; no known strep carriers in the home;    Objective:  Vitals:   03/16/22 1454  BP: 126/60  Pulse: 89  Temp: 98.6 F (37 C)  TempSrc: Oral  SpO2: 98%  Weight: 165 lb (74.8 kg)  Height: '5\' 4"'$  (1.626 m)    General: Well developed, well nourished, in no acute distress  Skin : Warm and dry.  Head: Normocephalic and atraumatic  Eyes: Sclera and conjunctiva clear; pupils round and reactive to light; extraocular movements intact  Ears: External normal; canals  clear; tympanic membranes normal  Oropharynx: Pink, supple. Enlarged tonsils;  Neck: Supple without thyromegaly, adenopathy  Lungs: Respirations unlabored;  Neurologic: Alert and oriented; speech intact; face symmetrical; moves all extremities well; CNII-XII intact without focal deficit   Assessment:  1. Strep throat     Plan:   Rapid strep is positive; concern for recurrent infections; will treat with Augmentin x 10 days; she will come back in 3 weeks to re-check throat culture to ensure complete resolution; may need to refer to ENT for further evaluation.   Return in about 3 weeks (around 04/06/2022) for Nurse visit/ throat culture.  Orders Placed This Encounter  Procedures   POCT rapid strep A    Requested Prescriptions    Signed Prescriptions Disp Refills   amoxicillin-clavulanate (AUGMENTIN) 875-125 MG tablet 20 tablet 0    Sig: Take 1 tablet by mouth 2 (two) times daily for 10 days.   fluconazole (DIFLUCAN) 150 MG tablet 2 tablet 1    Sig: Take 1 tablet as directed; repeat after 72 hours;

## 2022-03-16 NOTE — Patient Instructions (Signed)
Dear Ms. Phillip Heal, I ordered the test. If you are not contacted about this in the next ~1 week, please call the following number to schedule it: [phone]. Sincerely, Philemon Kingdom MD

## 2022-03-22 ENCOUNTER — Ambulatory Visit: Payer: 59 | Admitting: Registered"

## 2022-04-05 ENCOUNTER — Ambulatory Visit: Payer: 59 | Admitting: Registered"

## 2022-04-05 NOTE — Progress Notes (Signed)
Pt here for throat culture per Jodi Mourning, FNP : "03/16/2022 Return in about 3 weeks (around 04/06/2022) for Nurse visit/ throat culture."

## 2022-04-06 ENCOUNTER — Ambulatory Visit: Payer: 59

## 2022-04-06 DIAGNOSIS — J020 Streptococcal pharyngitis: Secondary | ICD-10-CM

## 2022-04-08 LAB — CULTURE, GROUP A STREP
MICRO NUMBER:: 14093381
SPECIMEN QUALITY:: ADEQUATE

## 2022-04-12 ENCOUNTER — Encounter: Payer: 59 | Attending: Family | Admitting: Registered"

## 2022-04-12 ENCOUNTER — Encounter: Payer: Self-pay | Admitting: Registered"

## 2022-04-12 VITALS — Wt 163.5 lb

## 2022-04-12 DIAGNOSIS — E663 Overweight: Secondary | ICD-10-CM | POA: Insufficient documentation

## 2022-04-12 NOTE — Patient Instructions (Addendum)
Consider having some vegetables when having Lasagne. Salad or steam vegetables. Protein at breakfast would be good Avocado toast is a great thing to include in your breakfast rotation. Boiled eggs, [place] yogurt. Keep working creating a bedtime routine. Continue mixing up your work environment. Drinking water in the afternoon to stay hydrated and keep energy up. Vitamin C packs in ice water may help you drink more water. Look for creative snacks for [employer] before dinner.

## 2022-04-12 NOTE — Progress Notes (Signed)
Medical Nutrition Therapy follow-up Appointment Start time:  1650  Appointment End time:  1720  Primary concerns today: She states that she wants to continue working on improving eating habits and if possible return to her pre-pregnancy weight.   Referral diagnosis: E66.3 (ICD-10-CM) - Overweight (BMI 25.0-29.9) Preferred learning style: no preference Learning readiness: ready  NUTRITION ASSESSMENT  Anthropometrics  Wt Readings from Last 3 Encounters:  04/12/22 163 lb 8 oz (74.2 kg)  03/16/22 165 lb (74.8 kg)  01/20/22 164 lb 8 oz (74.6 kg)     Clinical Medical Hx: hyperthyroidism, anxiety, depression Medications: reviewed Labs: reviewed Notable Signs/Symptoms: weight gain  Lifestyle & Dietary Hx Diet:  Pt states she is having less snacks from the vending machine. Pt reports she still finds it hard to drink more water. Breakfast is an area that is still a work in progress, sometimes the kids will have cereal and milk. Pt states her son likes English muffin with avocado.  Sleep: Working on getting kids into a bedtime routine and be able to settle down for the night.  Physical activity: not assessed this visit.  Estimated daily fluid intake: 60 oz Supplements: not assessed Sleep: ~8 hrs Stress / self-care: moderate stress Current average weekly physical activity: not assessed  24-Hr Dietary Recall First Meal: English muffin, strawberry jam, coffee with almond milk Snack:  Second Meal: (1 pm) impossible frozen bowls pineapple chicken, lemon seltzer   Snack: none Third Meal: grilled pork chops, steamed broccoli, tator tots Snack during night: 1 am corn dog Beverages: 1-2 c coffee at home, when in office more 3-4 cups of coffee, 2 cups 2% milk, 1-2 cans seltzer water, 2 cups water  NUTRITION DIAGNOSIS  NB-1.1 Food and nutrition-related knowledge deficit As related to importance of eating balanced meals and snacks.  As evidenced by pt dietary recall including carb only  breakfast/snacks.  NUTRITION INTERVENTION  Nutrition education (E-1) on the following topics:  Ideas for protein at breakfast Including vegetable sides with high fat meals   Handouts Provided Include  none  Learning Style & Readiness for Change Teaching method utilized: Visual & Auditory  Demonstrated degree of understanding via: Teach Back  Barriers to learning/adherence to lifestyle change: none indicated  Goals Consider having some vegetables when having Lasagne. Salad or steam vegetables. Protein at breakfast would be good Avocado toast is a great thing to include in your breakfast rotation. Boiled eggs, [place] yogurt. Keep working creating a bedtime routine. Continue mixing up your work environment. Drinking water in the afternoon to stay hydrated and keep energy up. Vitamin C packs in ice water may help you drink more water. Look for creative snacks for [place] before dinner.  MONITORING & EVALUATION Dietary intake, weekly physical activity, and follow-up in 2 months.

## 2022-05-11 ENCOUNTER — Ambulatory Visit
Admission: RE | Admit: 2022-05-11 | Discharge: 2022-05-11 | Disposition: A | Discharge status: Home / Self Care | Payer: 59 | Source: Ambulatory Visit | Attending: Emergency Medicine | Admitting: Emergency Medicine

## 2022-05-11 VITALS — BP 146/91 | HR 84 | Temp 98.9°F | Resp 16

## 2022-05-11 DIAGNOSIS — J029 Acute pharyngitis, unspecified: Secondary | ICD-10-CM | POA: Insufficient documentation

## 2022-05-11 DIAGNOSIS — J309 Allergic rhinitis, unspecified: Secondary | ICD-10-CM | POA: Insufficient documentation

## 2022-05-11 DIAGNOSIS — J0140 Acute pansinusitis, unspecified: Secondary | ICD-10-CM | POA: Diagnosis not present

## 2022-05-11 DIAGNOSIS — H6591 Unspecified nonsuppurative otitis media, right ear: Secondary | ICD-10-CM | POA: Diagnosis not present

## 2022-05-11 DIAGNOSIS — Z8709 Personal history of other diseases of the respiratory system: Secondary | ICD-10-CM | POA: Diagnosis present

## 2022-05-11 LAB — POCT RAPID STREP A (OFFICE): Rapid Strep A Screen: NEGATIVE

## 2022-05-11 MED ORDER — IBUPROFEN 400 MG PO TABS: 400.0000 mg | ORAL_TABLET | Freq: Three times a day (TID) | ORAL | 0 refills | Status: AC | PRN

## 2022-05-11 MED ORDER — IPRATROPIUM BROMIDE 0.06 % NA SOLN: 2.0000 | Freq: Three times a day (TID) | NASAL | 1 refills | Status: DC

## 2022-05-11 MED ORDER — MOMETASONE FUROATE 50 MCG/ACT NA SUSP: 2.0000 | Freq: Every day | NASAL | 2 refills | Status: DC

## 2022-05-11 MED ORDER — FEXOFENADINE HCL 180 MG PO TABS: 180.0000 mg | ORAL_TABLET | Freq: Every day | ORAL | 2 refills | Status: DC

## 2022-05-11 MED ORDER — DOXYCYCLINE MONOHYDRATE 100 MG PO TABS: 100.0000 mg | ORAL_TABLET | Freq: Two times a day (BID) | ORAL | 0 refills | Status: AC

## 2022-05-11 NOTE — ED Triage Notes (Signed)
Pt states sore throat for a week. Has been taking OTC medications at home.

## 2022-05-11 NOTE — ED Provider Notes (Signed)
[facility]    CSN: [id number] Arrival date & time: 05/11/22  1454    HISTORY   Chief Complaint  Patient presents with   Sore Throat    Entered by patient   HPI Sherry Ray is a pleasant, 41 y.o. female who presents to urgent care today. Patient complains of a 1 week history of sore throat.  Patient states she has been taking over-the-counter medications at home.  Patient reports a history of allergies, not currently taking cetirizine and Flonase only occasionally.  Of note, rapid strep test today is negative.  Patient states that she was diagnosed with streptococcal pharyngitis in August of this year and had to be treated twice, initially with cefdinir and then the second time with Augmentin 875 mg.  Patient states she was tested for cure April 03, 2022 and tested negative.  Patient states she has 2 small children at home who always seem to be sick with "general crud", but nothing specific at this time.  Patient denies fever, chills, body ache, headache, nausea, vomiting, diarrhea.  The history is provided by the patient.   Past Medical History:  Diagnosis Date   Antithrombin deficiency [facility])    had a PE (2008) and DVT (1996), took coumadin for 1 year   Anxiety and depression    Depression    DVT (deep venous thrombosis) ([name])    Headache    History of cholestasis during pregnancy    Pulmonary embolism ([place])    Vaginal Pap smear, abnormal    Patient Active Problem List   Diagnosis Date Noted   Over weight 04/12/2022   Thyroid nodule 08/26/2021   Hyperthyroidism 08/19/2020   History of cesarean section 12/31/2019   Term pregnancy 12/31/2019   Upper respiratory tract infection 07/05/2019   Antithrombin deficiency ([place]) 07/14/2016   Depression with anxiety 12/17/2015   Past Surgical History:  Procedure Laterality Date   CESAREAN SECTION     CESAREAN SECTION N/A 12/31/2019   Procedure: CESAREAN SECTION;  Surgeon: Everlene Farrier, MD;  Location: [place] [facility]  ORS;  Service: Obstetrics;  Laterality: N/ANira Conn, RNFA   CHOLECYSTECTOMY N/A 03/31/2020   Procedure: LAPAROSCOPIC CHOLECYSTECTOMY;  Surgeon: Dwan Bolt, MD;  Location: [place];  Service: General;  Laterality: N/A;   ESOPHAGOGASTRODUODENOSCOPY ENDOSCOPY     EYE SURGERY Left 04/30/2020   Corneal cross linking   FIRST RIB REMOVAL     LEEP     NASAL SINUS SURGERY     OB History     Gravida  2   Para  2   Term  2   Preterm      AB      Living  2      SAB      IAB      Ectopic      Multiple  0   Live Births  1          Home Medications    Prior to Admission medications   Medication Sig Start Date End Date Taking? Authorizing Provider  cetirizine (ZYRTEC) 10 MG tablet Take 10 mg by mouth daily.     [provider]  fluconazole (DIFLUCAN) 150 MG tablet Take 1 tablet as directed; repeat after 72 hours; 03/16/22   Marrian Salvage, FNP  fluticasone [facility]) 50 MCG/ACT nasal spray Place 2 sprays into both nostrils daily. 10/23/21   Marrian Salvage, FNP  ibuprofen (ADVIL) 600 MG tablet Take 1 tablet (600 mg total) by  mouth every 6 (six) hours as needed for mild pain, moderate pain or cramping. 01/02/20   Louretta Shorten, MD  levonorgestrel (MIRENA) 20 MCG/24HR IUD 1 each by Intrauterine route once.    [provider]  sertraline (ZOLOFT) 100 MG tablet Take 1 tablet (100 mg total) by mouth daily. 05/15/21   Marrian Salvage, FNP    Family History Family History  Problem Relation Age of Onset   Cancer Father    Thyroid disease Neg Hx    Social History Social History   Tobacco Use   Smoking status: Never   Smokeless tobacco: Never  Vaping Use   Vaping Use: Never used  Substance Use Topics   Alcohol use: No    Alcohol/week: 0.0 standard drinks of alcohol   Drug use: No   Allergies   Patient has no known allergies.  Review of Systems Review of Systems Pertinent findings revealed after performing a 14 point review of  systems has been noted in the history of present illness.  Physical Exam Triage Vital Signs ED Triage Vitals  Enc Vitals Group     BP 04/10/21 0827 (!) 147/82     Pulse Rate 04/10/21 0827 72     Resp 04/10/21 0827 18     Temp 04/10/21 0827 98.3 F (36.8 C)     Temp Source 04/10/21 0827 Oral     SpO2 04/10/21 0827 98 %     Weight --      Height --      Head Circumference --      Peak Flow --      Pain Score 04/10/21 0826 5     Pain Loc --      Pain Edu? --      Excl. in [place]? --   No data found.  Updated Vital Signs BP (!) 146/91 (BP Location: Left Arm)   Pulse 84   Temp 98.9 F (37.2 C) (Oral)   Resp 16   LMP 05/04/2022 (Approximate)   SpO2 97%   Physical Exam Vitals and nursing note reviewed.  Constitutional:      General: She is not in acute distress.    Appearance: Normal appearance. She is not ill-appearing.  HENT:     Head: Normocephalic and atraumatic.     Salivary Glands: Right salivary gland is not diffusely enlarged or tender. Left salivary gland is not diffusely enlarged or tender.     Right Ear: Hearing, ear canal and external ear normal. No decreased hearing noted. No drainage, swelling or tenderness. A middle ear effusion is present. There is no impacted cerumen. No mastoid tenderness. Tympanic membrane is scarred and bulging. Tympanic membrane is not injected, perforated, erythematous or retracted.     Left Ear: Hearing, ear canal and external ear normal. No decreased hearing noted. No drainage, swelling or tenderness.  No middle ear effusion. There is no impacted cerumen. No mastoid tenderness. Tympanic membrane is scarred and bulging. Tympanic membrane is not injected, perforated, erythematous or retracted.     Ears:     Comments: Bilateral EACs normal, both TMs bulging with clear fluid, right TM with effusion    Nose: Rhinorrhea present. No nasal deformity, septal deviation, signs of injury, nasal tenderness, mucosal edema or congestion. Rhinorrhea is clear.      Right Nostril: No foreign body, epistaxis, septal hematoma or occlusion.     Left Nostril: No foreign body, epistaxis, septal hematoma or occlusion.     Right Turbinates: Enlarged and swollen. Not  pale.     Left Turbinates: Enlarged and swollen. Not pale.     Right Sinus: Maxillary sinus tenderness and frontal sinus tenderness present.     Left Sinus: Maxillary sinus tenderness and frontal sinus tenderness present.     Mouth/Throat:     Lips: Pink. No lesions.     Mouth: Mucous membranes are moist. No oral lesions.     Pharynx: Oropharynx is clear. Uvula midline. No posterior oropharyngeal erythema or uvula swelling.     Tonsils: No tonsillar exudate. 0 on the right. 0 on the left.     Comments: Postnasal drip Eyes:     General: Lids are normal.        Right eye: No discharge.        Left eye: No discharge.     Extraocular Movements: Extraocular movements intact.     Conjunctiva/sclera: Conjunctivae normal.     Right eye: Right conjunctiva is not injected.     Left eye: Left conjunctiva is not injected.  Neck:     Trachea: Trachea and phonation normal.  Cardiovascular:     Rate and Rhythm: Normal rate and regular rhythm.     Pulses: Normal pulses.     Heart sounds: Normal heart sounds. No murmur heard.    No friction rub. No gallop.  Pulmonary:     Effort: Pulmonary effort is normal. No accessory muscle usage, prolonged expiration or respiratory distress.     Breath sounds: Normal breath sounds. No stridor, decreased air movement or transmitted upper airway sounds. No decreased breath sounds, wheezing, rhonchi or rales.  Chest:     Chest wall: No tenderness.  Musculoskeletal:        General: Normal range of motion.     Cervical back: Full passive range of motion without pain, normal range of motion and neck supple. Normal range of motion.  Lymphadenopathy:     Cervical: No cervical adenopathy.  Skin:    General: Skin is warm and dry.     Findings: No erythema or rash.   Neurological:     General: No focal deficit present.     Mental Status: She is alert and oriented to person, place, and time.  Psychiatric:        Mood and Affect: Mood normal.        Behavior: Behavior normal.     Visual Acuity Right Eye Distance:   Left Eye Distance:   Bilateral Distance:    Right Eye Near:   Left Eye Near:    Bilateral Near:     UC Couse / Diagnostics / Procedures:     Radiology No results found.  Procedures Procedures (including critical care time) EKG  Pending results:  Labs Reviewed  CULTURE, GROUP A STREP [facility])  POCT RAPID STREP A (OFFICE)    Medications Ordered in UC: Medications - No data to display  UC Diagnoses / Final Clinical Impressions(s)   I have reviewed the triage vital signs and the nursing notes.  Pertinent labs & imaging results that were available during my care of the patient were reviewed by me and considered in my medical decision making (see chart for details).    Final diagnoses:  Allergic rhinitis, unspecified seasonality, unspecified trigger  Acute pansinusitis, recurrence not specified  Otitis media with effusion, right  Acute pharyngitis, unspecified etiology  History of streptococcal pharyngitis   Patient was agreeable to my sending a prescription for doxycycline to her pharmacy given that she is recently taken Augmentin  and cefdinir for her upper respiratory infections.  Patient advised that if she is able to get ahead of this in the next 2 to 3 days, she does not need to take it.  Patient encouraged to resume allergy medications, prescriptions provided for Allegra and Nasonex as well as Atrovent nasal spray to help dry up mucus secretions and decrease nasal congestion.  From provided to relieve respiratory inflammation and sinus pain.  Patient further advised that she can try Mucinex to help thin secretions to promote drainage from her sinuses if she would like.  ED Prescriptions     Medication Sig Dispense  Auth. Provider   mometasone (NASONEX) 50 MCG/ACT nasal spray Place 2 sprays into the nose daily. 1 each Lynden Oxford Scales, PA-C   fexofenadine (ALLEGRA) 180 MG tablet Take 1 tablet (180 mg total) by mouth daily. 30 tablet Lynden Oxford Scales, PA-C   ipratropium (ATROVENT) 0.06 % nasal spray Place 2 sprays into both nostrils 3 (three) times daily. As needed for nasal congestion, runny nose 15 mL Lynden Oxford Scales, PA-C   ibuprofen (ADVIL) 400 MG tablet Take 1 tablet (400 mg total) by mouth every 8 (eight) hours as needed for up to 14 days. 42 tablet Lynden Oxford Scales, PA-C   doxycycline (ADOXA) 100 MG tablet Take 1 tablet (100 mg total) by mouth 2 (two) times daily for 10 days. 20 tablet Lynden Oxford Scales, PA-C      PDMP not reviewed this encounter.  Disposition Upon Discharge:  Condition: stable for discharge home Home: take medications as prescribed; routine discharge instructions as discussed; follow up as advised.  Patient presented with an acute illness with associated systemic symptoms and significant discomfort requiring urgent management. In my opinion, this is a condition that a prudent lay person (someone who possesses an average knowledge of health and medicine) may potentially expect to result in complications if not addressed urgently such as respiratory distress, impairment of bodily function or dysfunction of bodily organs.   Routine symptom specific, illness specific and/or disease specific instructions were discussed with the patient and/or caregiver at length.   As such, the patient has been evaluated and assessed, work-up was performed and treatment was provided in alignment with urgent care protocols and evidence based medicine.  Patient/parent/caregiver has been advised that the patient may require follow up for further testing and treatment if the symptoms continue in spite of treatment, as clinically indicated and appropriate.  If the patient was  tested for COVID-19, Influenza and/or RSV, then the patient/parent/guardian was advised to isolate at home pending the results of his/her diagnostic coronavirus test and potentially longer if they're positive. I have also advised pt that if his/her COVID-19 test returns positive, it's recommended to self-isolate for at least 10 days after symptoms first appeared AND until fever-free for 24 hours without fever reducer AND other symptoms have improved or resolved. Discussed self-isolation recommendations as well as instructions for household member/close contacts as per the [facility] and [place] DHHS, and also gave patient the [place] packet with this information.  Patient/parent/caregiver has been advised to return to the [facility] or PCP in 3-5 days if no better; to PCP or the Emergency Department if new signs and symptoms develop, or if the current signs or symptoms continue to change or worsen for further workup, evaluation and treatment as clinically indicated and appropriate  The patient will follow up with their current PCP if and as advised. If the patient does not currently have a PCP we will  assist them in obtaining one.   The patient may need specialty follow up if the symptoms continue, in spite of conservative treatment and management, for further workup, evaluation, consultation and treatment as clinically indicated and appropriate.  Patient/parent/caregiver verbalized understanding and agreement of plan as discussed.  All questions were addressed during visit.  Please see discharge instructions below for further details of plan.  Discharge Instructions:   Discharge Instructions      Your strep test today is negative.  Streptococcal throat culture will be performed per our protocol.  The result of your throat culture will be posted to your MyChart once it is complete, this typically takes 3 to 5 days.  If your streptococcal throat culture is positive, you will be contacted by phone and antibiotics will  prescribed for you.   Please read below to learn more about the medications, dosages and frequencies that I recommend to help alleviate your symptoms and to get you feeling better soon:       Allegra (fexofenadine): This is an excellent second-generation antihistamine that helps to reduce respiratory inflammatory response to environmental allergens.  This medication is not known to cause daytime sleepiness so it can be taken in the daytime.  If you find that it does make you sleepy, please feel free to take it at bedtime.   Nasonex (mometasone): This is a steroid nasal spray that you use once daily, 2 sprays in each nare.  This medication does not work well if you decide to use it only used as you feel you need to, it works best used on a daily basis.  After 3 to 5 days of use, you will notice significant reduction of the inflammation and mucus production that is currently being caused by exposure to allergens, whether seasonal or environmental.  The most common side effect of this medication is nosebleeds.  If you experience a nosebleed, please discontinue use for 1 week, then feel free to resume.  I have provided you with a prescription.     Atrovent (ipratropium): This is an excellent nasal decongestant spray I have added to your recommended nasal steroid that will not cause rebound congestion, please instill 2 sprays into each nare with each use.  Because nasal steroids can take several days before they begin to provide full benefit, I recommend that you use this spray in addition to the nasal steroid prescribed for you.  Please use it after you have used your nasal steroid and repeat up to 4 times daily as needed.  I have provided you with a prescription for this medication.      Advil, Motrin (ibuprofen): This is a good anti-inflammatory medication which not only addresses aches, pains but also significantly reduces soft tissue inflammation of the upper airways that causes sinus and nasal congestion  as well as inflammation of the lower airways which makes you feel like your breathing is constricted or your cough feel tight.  I recommend that you take 400 mg every 8 hours as needed.     I have also sent a prescription for doxycycline 1 tablet twice daily for 10 days to your pharmacy.  You do not need to pick this prescription up at this time.  In the next 3 to 4 days, if you feel that you are sinus pressure is getting worse, sinus drainage becomes darker in color concerning for bacterial infection or if you begin to have pain in your right ear, please feel free to pick it up and take  it.   Please follow-up within the next 7-10 days either with your primary care provider or urgent care if your symptoms do not resolve.  If you do not have a primary care provider, we will assist you in finding one.        Thank you for visiting urgent care today.  We appreciate the opportunity to participate in your care.         This office note has been dictated using Museum/gallery curator.  Unfortunately, this method of dictation can sometimes lead to typographical or grammatical errors.  I apologize for your inconvenience in advance if this occurs.  Please do not hesitate to reach out to me if clarification is needed.      Lynden Oxford Scales, PA-C 05/11/22 1615

## 2022-05-11 NOTE — Discharge Instructions (Signed)
Your strep test today is negative.  Streptococcal throat culture will be performed per our protocol.  The result of your throat culture will be posted to your MyChart once it is complete, this typically takes 3 to 5 days.  If your streptococcal throat culture is positive, you will be contacted by phone and antibiotics will prescribed for you.   Please read below to learn more about the medications, dosages and frequencies that I recommend to help alleviate your symptoms and to get you feeling better soon:       Allegra (fexofenadine): This is an excellent second-generation antihistamine that helps to reduce respiratory inflammatory response to environmental allergens.  This medication is not known to cause daytime sleepiness so it can be taken in the daytime.  If you find that it does make you sleepy, please feel free to take it at bedtime.   Nasonex (mometasone): This is a steroid nasal spray that you use once daily, 2 sprays in each nare.  This medication does not work well if you decide to use it only used as you feel you need to, it works best used on a daily basis.  After 3 to 5 days of use, you will notice significant reduction of the inflammation and mucus production that is currently being caused by exposure to allergens, whether seasonal or environmental.  The most common side effect of this medication is nosebleeds.  If you experience a nosebleed, please discontinue use for 1 week, then feel free to resume.  I have provided you with a prescription.     Atrovent (ipratropium): This is an excellent nasal decongestant spray I have added to your recommended nasal steroid that will not cause rebound congestion, please instill 2 sprays into each nare with each use.  Because nasal steroids can take several days before they begin to provide full benefit, I recommend that you use this spray in addition to the nasal steroid prescribed for you.  Please use it after you have used your nasal steroid and repeat  up to 4 times daily as needed.  I have provided you with a prescription for this medication.      Advil, Motrin (ibuprofen): This is a good anti-inflammatory medication which not only addresses aches, pains but also significantly reduces soft tissue inflammation of the upper airways that causes sinus and nasal congestion as well as inflammation of the lower airways which makes you feel like your breathing is constricted or your cough feel tight.  I recommend that you take 400 mg every 8 hours as needed.     I have also sent a prescription for doxycycline 1 tablet twice daily for 10 days to your pharmacy.  You do not need to pick this prescription up at this time.  In the next 3 to 4 days, if you feel that you are sinus pressure is getting worse, sinus drainage becomes darker in color concerning for bacterial infection or if you begin to have pain in your right ear, please feel free to pick it up and take it.   Please follow-up within the next 7-10 days either with your primary care provider or urgent care if your symptoms do not resolve.  If you do not have a primary care provider, we will assist you in finding one.        Thank you for visiting urgent care today.  We appreciate the opportunity to participate in your care.

## 2022-05-14 LAB — CULTURE, GROUP A STREP (THRC)

## 2022-05-20 ENCOUNTER — Ambulatory Visit (INDEPENDENT_AMBULATORY_CARE_PROVIDER_SITE_OTHER): Payer: 59 | Admitting: Family

## 2022-05-20 VITALS — BP 124/72 | HR 95 | Temp 97.4°F | Resp 18 | Ht 64.0 in | Wt 168.0 lb

## 2022-05-20 DIAGNOSIS — F418 Other specified anxiety disorders: Secondary | ICD-10-CM | POA: Diagnosis not present

## 2022-05-20 DIAGNOSIS — J329 Chronic sinusitis, unspecified: Secondary | ICD-10-CM

## 2022-05-20 MED ORDER — SERTRALINE HCL 100 MG PO TABS: 200.0000 mg | ORAL_TABLET | Freq: Every day | ORAL | 3 refills | Status: DC

## 2022-05-20 MED ORDER — FLUCONAZOLE 150 MG PO TABS: ORAL_TABLET | ORAL | 1 refills | Status: DC

## 2022-05-20 NOTE — Progress Notes (Signed)
Sherry Ray is a 41 y.o. female with the following history as recorded in EpicCare:  Patient Active Problem List   Diagnosis Date Noted   Over weight 04/12/2022   Thyroid nodule 08/26/2021   Hyperthyroidism 08/19/2020   History of cesarean section 12/31/2019   Term pregnancy 12/31/2019   Upper respiratory tract infection 07/05/2019   Antithrombin deficiency ([place]) 07/14/2016   Depression with anxiety 12/17/2015    Current Outpatient Medications  Medication Sig Dispense Refill   fluconazole (DIFLUCAN) 150 MG tablet Take 1 tablet daily as directed; repeat after 72 hours; 2 tablet 1   cetirizine (ZYRTEC) 10 MG tablet Take 10 mg by mouth daily.      doxycycline (ADOXA) 100 MG tablet Take 1 tablet (100 mg total) by mouth 2 (two) times daily for 10 days. 20 tablet 0   fexofenadine (ALLEGRA) 180 MG tablet Take 1 tablet (180 mg total) by mouth daily. (Patient not taking: Reported on 05/20/2022) 30 tablet 2   fluticasone (FLONASE) 50 MCG/ACT nasal spray Place 2 sprays into both nostrils daily. 16 g 6   ibuprofen (ADVIL) 400 MG tablet Take 1 tablet (400 mg total) by mouth every 8 (eight) hours as needed for up to 14 days. 42 tablet 0   levonorgestrel (MIRENA) 20 MCG/24HR IUD 1 each by Intrauterine route once.     mometasone (NASONEX) 50 MCG/ACT nasal spray Place 2 sprays into the nose daily. (Patient not taking: Reported on 05/20/2022) 1 each 2   sertraline (ZOLOFT) 100 MG tablet Take 2 tablets (200 mg total) by mouth daily. 180 tablet 3   No current facility-administered medications for this visit.    Allergies: Patient has no known allergies.  Past Medical History:  Diagnosis Date   Antithrombin deficiency ([place])    had a PE (2008) and DVT (1996), took coumadin for 1 year   Anxiety and depression    Depression    DVT (deep venous thrombosis) ([name])    Headache    History of cholestasis during pregnancy    Pulmonary embolism ([place])    Vaginal Pap smear, abnormal     Past Surgical  History:  Procedure Laterality Date   CESAREAN SECTION     CESAREAN SECTION N/A 12/31/2019   Procedure: CESAREAN SECTION;  Surgeon: Everlene Farrier, MD;  [facility]: [facility];  Service: Obstetrics;  Laterality: N/A;  Nira Conn, RNFA   CHOLECYSTECTOMY N/A 03/31/2020   Procedure: LAPAROSCOPIC CHOLECYSTECTOMY;  Surgeon: Dwan Bolt, MD;  Location: [place];  Service: General;  Laterality: N/A;   ESOPHAGOGASTRODUODENOSCOPY ENDOSCOPY     EYE SURGERY Left 04/30/2020   Corneal cross linking   FIRST RIB REMOVAL     LEEP     NASAL SINUS SURGERY      Family History  Problem Relation Age of Onset   Cancer Father    Thyroid disease Neg Hx     Social History   Tobacco Use   Smoking status: Never   Smokeless tobacco: Never  Substance Use Topics   Alcohol use: No    Alcohol/week: 0.0 standard drinks of alcohol    Subjective:   Requesting referral to ENT; history of chronic sinus issues but has been much more problematic in the past year; also has had recurrent strep infections;  Did recently increase Zoloft to 200 mg daily ( just made change in the past week)- needs Rx adjusted; 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  Head: Normocephalic and atraumatic  Lungs: Respirations unlabored;  Neurologic: Alert and oriented; speech intact; face symmetrical; moves all extremities well; CNII-XII intact without focal deficit   Assessment:  1. Recurrent sinus infections   2. Depression with anxiety     Plan:  Refer to ENT as requested; Patient will let me know about response to increase Sertraline in the next 1-2 weeks; may want to consider adding low-dose Wellbutrin; follow up  to be determined.   No follow-ups on file.  Orders Placed This Encounter  Procedures   Ambulatory referral to ENT    Referral Priority:   Routine    Referral Type:   Consultation    Referral Reason:   Specialty Services Required    Requested Specialty:   Otolaryngology    Number of Visits Requested:   1    Requested Prescriptions   Signed Prescriptions Disp Refills   sertraline (ZOLOFT) 100 MG tablet 180 tablet 3    Sig: Take 2 tablets (200 mg total) by mouth daily.   fluconazole (DIFLUCAN) 150 MG tablet 2 tablet 1    Sig: Take 1 tablet daily as directed; repeat after 72 hours;

## 2022-06-23 ENCOUNTER — Ambulatory Visit: Payer: 59 | Admitting: Registered"

## 2022-07-16 ENCOUNTER — Encounter: Payer: Self-pay | Admitting: Internal Medicine

## 2022-07-16 ENCOUNTER — Ambulatory Visit (INDEPENDENT_AMBULATORY_CARE_PROVIDER_SITE_OTHER): Payer: 59 | Admitting: Internal Medicine

## 2022-07-16 VITALS — BP 110/70 | HR 101 | Ht 64.0 in | Wt 164.0 lb

## 2022-07-16 DIAGNOSIS — E042 Nontoxic multinodular goiter: Secondary | ICD-10-CM | POA: Diagnosis not present

## 2022-07-16 DIAGNOSIS — E0590 Thyrotoxicosis, unspecified without thyrotoxic crisis or storm: Secondary | ICD-10-CM | POA: Diagnosis not present

## 2022-07-16 NOTE — Patient Instructions (Signed)
Please stop at the lab.  You should have an endocrinology follow-up appointment in 6 months.

## 2022-07-16 NOTE — Progress Notes (Unsigned)
Patient ID: [name], female   DOB: 20-Nov-1980, 42 y.o.   MRN: [id number]  HPI  Sherry Ray is a 42 y.o.-year-old female, returning for follow-up for thyrotoxicosis and thyroid nodules.  She previously saw Dr. Loanne Ray, last visit 6 months ago.  Interim history: Since last visit, she continues to have problems with strep throat, sinusitis. Saw ENT >> will start ABx again. She increased the Sertraline dose in 05/2022 >> feeling better. She has no complaints otherwise.  I reviewed pt's thyroid tests: Lab Results  Component Value Date   TSH 0.22 (L) 02/05/2022   TSH 0.28 (L) 08/26/2021   TSH 0.35 02/24/2021   TSH 0.16 (L) 10/20/2020   TSH 0.27 (L) 08/19/2020   TSH 0.19 (L) 07/07/2020   TSH 0.22 (L) 07/02/2020   TSH 0.584 06/06/2018   TSH 0.716 07/14/2016   FREET4 1.2 02/05/2022   FREET4 0.85 08/26/2021   FREET4 0.88 02/24/2021   FREET4 0.74 10/20/2020   FREET4 0.76 08/19/2020   T3FREE 3.8 02/05/2022   Lab Results  Component Value Date   T3FREE 3.8 02/05/2022   Antithyroid antibodies: Lab Results  Component Value Date   TSI <89 02/05/2022   In 01/2022, I suggested a thyroid uptake and scan but she did not go through with it.  Thyroid U/S (11/20/2020): Parenchymal Echotexture: Mildly heterogenous  Isthmus: 0.5 cm  Right lobe: 6.8 x 2.3 x 3.0 cm  Left lobe: 6.5 x 1.7 x 2.2 cm _________________________________________________________   Nodule # 1:  Location: Right; Superior  Maximum size: 1.2 cm; Other 2 dimensions: 1.0 x 0.7 cm  Composition: solid/almost completely solid (2)  Echogenicity: hypoechoic (2)  *Given size (>/= 1 - 1.4 cm) and appearance, a follow-up ultrasound in 1 year should be considered based on TI-RADS criteria.  _________________________________________________________   Nodule # 2:  Location: Right; Inferior  Maximum size: 1.2 cm; Other 2 dimensions: 1.1 x 0.6 cm  Composition: solid/almost completely solid (2)  Echogenicity:  isoechoic (1)  Given size (<1.4 cm) and appearance, this nodule does NOT meet TI-RADS criteria for biopsy or dedicated follow-up.  _________________________________________________________   IMPRESSION: 1. Mildly enlarged, mildly heterogeneous thyroid gland. 2. Solid nodule in the right superior thyroid (labeled 1, 1.2 cm) meets criteria (TI-RADS category 4) for 1 year ultrasound surveillance.   Thyroid ultrasound (01/05/2022): Parenchymal Echotexture: Mildly heterogenous  Isthmus: 0.5 cm  Right lobe: 6.0 x 2.0 x 2.4 cm  Left lobe: 5.6 x 1.7 x 1.9 cm  _________________________________________________________   Nodule # 1: The previously identified 1.2 cm nodule in the right mid to upper gland has partially involuted since the prior study and now measures only 0.9 x 0.9 x 0.7 cm. This nodule no longer meets criteria to warrant further follow-up.   Nodule # 2:  Prior biopsy: No  Location: Right; Inferior  Maximum size: 1.2 cm; Other 2 dimensions: 1.1 x 0.6 cm, previously, 1.2 x 1.1 x 0.6 cm  Composition: solid/almost completely solid (2)  Echogenicity: hypoechoic (2) Change in features: Yes  Change in ACR TI-RADS risk category: Yes  *Given size (>/= 1 - 1.4 cm) and appearance, a follow-up ultrasound in 1 year should be considered based on TI-RADS criteria.  _________________________________________________________   No new nodules or suspicious features.   IMPRESSION: 1. Interval involution of the previously identified TI-RADS category 4 nodule in the right mid to upper gland. This lesion no longer meets criteria to warrant further evaluation. 2. However, the TI-RADS category 3 nodule in the  right inferior gland has become more hypoechoic in appearance and is now consistent with TI-RADS category 4 and meets criteria for imaging surveillance. Recommend follow-up ultrasound in 1 year.  Pt denies: - feeling nodules in neck - hoarseness - dysphagia - choking  She  denies: - excessive sweating/heat intolerance - anxiety - palpitations - hyperdefecation - weight loss  - Tremors  Pt does not have a FH of thyroid ds. No FH of thyroid cancer. No h/o radiation tx to head or neck. No recent contrast studies. No herbal supplements. No Biotin use.  Pt. also has a history of Antithrombin deficiency with history of DVT and PE, headaches.  She gave birth in 2019, 2021.  ROS:  + see HPI  Past Medical History:  Diagnosis Date   Antithrombin deficiency (Sherry Ray)    had a PE (2008) and DVT (1996), took coumadin for 1 year   Anxiety and depression    Depression    DVT (deep venous thrombosis) ([name])    Headache    History of cholestasis during pregnancy    Pulmonary embolism (Sherry Ray)    Vaginal Pap smear, abnormal    Past Surgical History:  Procedure Laterality Date   CESAREAN SECTION     CESAREAN SECTION N/A 12/31/2019   Procedure: CESAREAN SECTION;  Surgeon: Sherry Farrier, MD;  [facility]: [facility];  Service: Obstetrics;  Laterality: N/ANira Ray, RNFA   CHOLECYSTECTOMY N/A 03/31/2020   Procedure: LAPAROSCOPIC CHOLECYSTECTOMY;  Surgeon: Dwan Bolt, MD;  Location: [facility];  Service: General;  Laterality: N/A;   ESOPHAGOGASTRODUODENOSCOPY ENDOSCOPY     EYE SURGERY Left 04/30/2020   Corneal cross linking   FIRST RIB REMOVAL     LEEP     NASAL SINUS SURGERY     Social History   Socioeconomic History   Marital status: Married    Spouse name: Not on file   Number of children: Not on file   Years of education: Not on file   Highest education level: Not on file  Occupational History   Not on file  Tobacco Use   Smoking status: Never   Smokeless tobacco: Never  Vaping Use   Vaping Use: Never used  Substance and Sexual Activity   Alcohol use: No    Alcohol/week: 0.0 standard drinks of alcohol   Drug use: No   Sexual activity: Yes    Birth control/protection: None  Other Topics Concern   Not on file  Social History Narrative   Not on file    Social Determinants of Health   Financial Resource Strain: Not on file  Food Insecurity: No Food Insecurity (01/20/2022)   Hunger Vital Sign    Worried About Running Out of Food in the Last Year: Never true    Ran Out of Food in the Last Year: Never true  Transportation Needs: Not on file  Physical Activity: Not on file  Stress: Not on file  Social Connections: Not on file  Intimate Partner Violence: Not on file   Current Outpatient Medications on File Prior to Visit  Medication Sig Dispense Refill   cetirizine (ZYRTEC) 10 MG tablet Take 10 mg by mouth daily.      fluconazole (DIFLUCAN) 150 MG tablet Take 1 tablet daily as directed; repeat after 72 hours; 2 tablet 1   fluticasone (FLONASE) 50 MCG/ACT nasal spray Place 2 sprays into both nostrils daily. 16 g 6   levonorgestrel (MIRENA) 20 MCG/24HR IUD 1 each by Intrauterine route once.  sertraline (ZOLOFT) 100 MG tablet Take 2 tablets (200 mg total) by mouth daily. 180 tablet 3   No current facility-administered medications on file prior to visit.   No Known Allergies Family History  Problem Relation Age of Onset   Cancer Father    Thyroid disease Neg Hx    PE: BP 110/70 (BP Location: Left Arm, Patient Position: Sitting, Cuff Size: Small)   Pulse (!) 101   Ht '5\' 4"'$  (1.626 m)   Wt 164 lb (74.4 kg)   SpO2 99%   BMI 28.15 kg/m  Wt Readings from Last 3 Encounters:  07/16/22 164 lb (74.4 kg)  05/20/22 168 lb (76.2 kg)  04/12/22 163 lb 8 oz (74.2 kg)   Constitutional: Slightly overweight, in NAD Eyes:  EOMI, no exophthalmos ENT: no neck masses, no cervical lymphadenopathy Cardiovascular: tachycardia, RR, No MRG Respiratory: CTA B Musculoskeletal: no deformities Skin:no rashes Neurological: no tremor with outstretched hands  ASSESSMENT: 1.  Subclinical thyrotoxicosis  2.  Thyroid nodules  PLAN:  1. Patient with h/o mildly low TSH with normal free thyroid hormones, family on the thyrotoxic symptoms: No weight  loss, heat intolerance, hyperdefecation, palpitations, anxiety. -She does not appear to have exogenous causes for the low TSH -We again this that possible causes for thyrotoxicosis are: Graves ds -her TSI antibodies were not elevated at last visit Thyroiditis -including postpartum thyroiditis -she gave birth few months prior to the test becoming abnormal.  However, the TSH persisted to be low afterwards toxic multinodular goiter/ toxic adenoma -she does have 2 thyroid nodules, which we are following -At today's visit, we will recheck the TSH, free T4, and free T3 -We discussed that if the tests remain abnormal, especially if they worsen, we may need a thyroid uptake and scan.  I did order this at last visit but she ended up not having the test done due to multiple URIs/sinusitis.  She agrees to have this done if still needed. -We discussed that possible modalities of treatment for the above conditions are: Methimazole use, RAI treatment, or, last resort, surgery. -She is again tachycardic today.  On arrival, pulse was 101.  Later in the visit, her pulse was 90.  We discussed that this can be a sign of thyrotoxicosis. - RTC in 6 months, possibly sooner for labs  2.  Thyroid nodules -Patient with 2 small thyroid nodules, which were initially evaluated by ultrasound in 11/2020.  There was no need for biopsy but one of the nodules (right side.)  Met criteria for 1 year follow-up.  She had another nodule (right inferior) for which no follow-up was needed.  We had a repeat thyroid ultrasound on 01/05/2022 and this showed that the right superior/mid thyroid nodule involuted, did not measure more than 0.9 cm, and without the need for follow-up.  However, the right inferior nodule appears to be hypoechoic, I previously was isoechoic.  Therefore, a follow-up in 1 year was recommended. -Plan to obtain another ultrasound after next visit, a year from the previous -She denies neck compression symptoms and no neck  masses are palpated today  Philemon Kingdom, MD PhD [facility]

## 2022-07-17 LAB — T3, FREE: T3, Free: 3.8 pg/mL (ref 2.3–4.2)

## 2022-07-17 LAB — TSH: TSH: 0.44 mIU/L

## 2022-07-17 LAB — T4, FREE: Free T4: 1.3 ng/dL (ref 0.8–1.8)

## 2022-11-17 LAB — RESULTS CONSOLE HPV: CHL HPV: NEGATIVE

## 2022-11-17 LAB — HM PAP SMEAR

## 2023-01-03 ENCOUNTER — Ambulatory Visit
Admission: RE | Admit: 2023-01-03 | Discharge: 2023-01-03 | Disposition: A | Discharge status: Home / Self Care | Payer: 59 | Source: Ambulatory Visit | Attending: Physician Assistant | Admitting: Physician Assistant

## 2023-01-03 VITALS — BP 140/86 | HR 93 | Temp 98.0°F | Resp 18

## 2023-01-03 DIAGNOSIS — R051 Acute cough: Secondary | ICD-10-CM

## 2023-01-03 DIAGNOSIS — J029 Acute pharyngitis, unspecified: Secondary | ICD-10-CM | POA: Diagnosis not present

## 2023-01-03 DIAGNOSIS — J069 Acute upper respiratory infection, unspecified: Secondary | ICD-10-CM | POA: Diagnosis present

## 2023-01-03 LAB — POCT RAPID STREP A (OFFICE): Rapid Strep A Screen: NEGATIVE

## 2023-01-03 MED ORDER — FLUTICASONE PROPIONATE 50 MCG/ACT NA SUSP: 1.0000 | Freq: Every day | NASAL | 0 refills | Status: AC

## 2023-01-03 MED ORDER — PROMETHAZINE-DM 6.25-15 MG/5ML PO SYRP: 5.0000 mL | ORAL_SOLUTION | Freq: Four times a day (QID) | ORAL | 0 refills | Status: DC | PRN

## 2023-01-03 NOTE — ED Triage Notes (Signed)
Pt presents with c/o nasal congestion, sore throat X 3 days.   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   Has been feeling tired and having headaches.   Home interventions: Ibuprofen

## 2023-01-03 NOTE — Discharge Instructions (Addendum)
You tested negative for strep.  We will contact you if your strep culture is positive we will need to start antibiotic.  I am concerned for a virus.  Continue allergy medication including fluticasone nasal spray and cetirizine daily.  Use nasal saline and sinus rinses for additional symptom relief.  Take Promethazine DM for cough.  This will make you sleepy so do not drive or drink alcohol while taking it.  If you have any worsening or changing symptoms or if your symptoms do not improve within a week please return for reevaluation.

## 2023-01-03 NOTE — ED Provider Notes (Signed)
[facility]    CSN: [id number] Arrival date & time: 01/03/23  3500      History   Chief Complaint Chief Complaint  Patient presents with   Appointment    7pm    Sore Throat    HPI Sherry Ray is a 41 y.o. female.   Patient presents today with a 3-day history of nasal congestion, sore throat, mild cough.  She denies any fever, chest pain, shortness of breath, nausea, vomiting.  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  She has no concern for pregnancy and is not currently breast-feeding.  She has tried ibuprofen without improvement of symptoms.  She does report history of seasonal allergies and takes cetirizine as well as fluticasone to manage this.  She is no specific concern for COVID and states current symptoms are not similar to previous episodes of this condition.  She has had COVID-19 vaccinations but not had the most recent booster.  She had COVID in April 2023.  Denies any recent antibiotics or steroids.  She does not smoke.  She is requesting strep testing today.    Past Medical History:  Diagnosis Date   Antithrombin deficiency [facility])    had a PE (2008) and DVT (1996), took coumadin for 1 year   Anxiety and depression    Depression    DVT (deep venous thrombosis) ([name])    Headache    History of cholestasis during pregnancy    Pulmonary embolism ([name])    Vaginal Pap smear, abnormal     Patient Active Problem List   Diagnosis Date Noted   Over weight 04/12/2022   Thyroid nodule 08/26/2021   Hyperthyroidism 08/19/2020   History of cesarean section 12/31/2019   Term pregnancy 12/31/2019   Upper respiratory tract infection 07/05/2019   Antithrombin deficiency ([name]) 07/14/2016   Depression with anxiety 12/17/2015    Past Surgical History:  Procedure Laterality Date   CESAREAN SECTION     CESAREAN SECTION N/A 12/31/2019   Procedure: CESAREAN SECTION;  Surgeon: Harold Hedge, MD;  [facility];   Service: Obstetrics;  Laterality: N/AHerbert Seta, RNFA   CHOLECYSTECTOMY N/A 03/31/2020   Procedure: LAPAROSCOPIC CHOLECYSTECTOMY;  Surgeon: Fritzi Mandes, MD;  Location: [facility];  Service: General;  Laterality: N/A;   ESOPHAGOGASTRODUODENOSCOPY ENDOSCOPY     EYE SURGERY Left 04/30/2020   Corneal cross linking   FIRST RIB REMOVAL     LEEP     NASAL SINUS SURGERY      OB History     Gravida  2   Para  2   Term  2   Preterm      AB      Living  2      SAB      IAB      Ectopic      Multiple  0   Live Births  1            Home Medications    Prior to Admission medications   Medication Sig Start Date End Date Taking? Authorizing Provider  promethazine-dextromethorphan (PROMETHAZINE-DM) 6.25-15 MG/5ML syrup Take 5 mLs by mouth 4 (four) times daily as needed for cough. 01/03/23  Yes Avion Patella K, PA-C  cetirizine (ZYRTEC) 10 MG tablet Take 10 mg by mouth daily.     [provider]  fluconazole (DIFLUCAN) 150 MG tablet Take 1 tablet daily as directed; repeat after 72 hours; Patient not  taking: Reported on 07/16/2022 05/20/22   Olive Bass, FNP  fluticasone [facility]) 50 MCG/ACT nasal spray Place 1 spray into both nostrils daily. 01/03/23   Lory Nowaczyk, Noberto Retort, PA-C  levonorgestrel (MIRENA) 20 MCG/24HR IUD 1 each by Intrauterine route once.    [provider]  sertraline (ZOLOFT) 100 MG tablet Take 2 tablets (200 mg total) by mouth daily. 05/20/22   Olive Bass, FNP    Family History Family History  Problem Relation Age of Onset   Cancer Father    Thyroid disease Neg Hx     Social History Social History   Tobacco Use   Smoking status: Never   Smokeless tobacco: Never  Vaping Use   Vaping status: Never Used  Substance Use Topics   Alcohol use: No    Alcohol/week: 0.0 standard drinks of alcohol   Drug use: No     Allergies   Patient has no known allergies.   Review of Systems Review of Systems  Constitutional:   Positive for activity change. Negative for appetite change, fatigue and fever.  HENT:  Positive for congestion, postnasal drip, sinus pressure and sore throat. Negative for sneezing, trouble swallowing and voice change.   Respiratory:  Positive for cough. Negative for shortness of breath.   Cardiovascular:  Negative for chest pain.  Gastrointestinal:  Negative for abdominal pain, diarrhea, nausea and vomiting.  Neurological:  Negative for dizziness, light-headedness and headaches.     Physical Exam Triage Vital Signs ED Triage Vitals [01/03/23 1911]  Encounter Vitals Group     BP (!) 140/86     Systolic BP Percentile      Diastolic BP Percentile      Pulse Rate 93     Resp 18     Temp 98 F (36.7 C)     Temp Source Oral     SpO2 94 %     Weight      Height      Head Circumference      Peak Flow      Pain Score 3     Pain Loc      Pain Education      Exclude from Growth Chart    No data found.  Updated Vital Signs BP (!) 140/86 (BP Location: Right Arm)   Pulse 93   Temp 98 F (36.7 C) (Oral)   Resp 18   SpO2 94%   Visual Acuity Right Eye Distance:   Left Eye Distance:   Bilateral Distance:    Right Eye Near:   Left Eye Near:    Bilateral Near:     Physical Exam Vitals reviewed.  Constitutional:      General: She is awake. She is not in acute distress.    Appearance: Normal appearance. She is well-developed. She is not ill-appearing.     Comments: Very pleasant female appears stated age in no acute distress  HENT:     Head: Normocephalic and atraumatic.     Right Ear: Ear canal and external ear normal. A middle ear effusion is present. Tympanic membrane is not erythematous or bulging.     Left Ear: Ear canal and external ear normal. A middle ear effusion is present. Tympanic membrane is not erythematous or bulging.     Nose:     Right Sinus: Maxillary sinus tenderness and frontal sinus tenderness present.     Left Sinus: Maxillary sinus tenderness and  frontal sinus tenderness present.     Mouth/Throat:  Pharynx: Uvula midline. Posterior oropharyngeal erythema and postnasal drip present. No oropharyngeal exudate.  Cardiovascular:     Rate and Rhythm: Normal rate and regular rhythm.     Heart sounds: Normal heart sounds, S1 normal and S2 normal. No murmur heard. Pulmonary:     Effort: Pulmonary effort is normal.     Breath sounds: Normal breath sounds. No wheezing, rhonchi or rales.     Comments: Clear to auscultation bilaterally Psychiatric:        Behavior: Behavior is cooperative.      UC Treatments / Results  Labs (all labs ordered are listed, but only abnormal results are displayed) Labs Reviewed  CULTURE, GROUP A STREP [facility])  POCT RAPID STREP A (OFFICE)    EKG   Radiology No results found.  Procedures Procedures (including critical care time)  Medications Ordered in UC Medications - No data to display  Initial Impression / Assessment and Plan / UC Course  I have reviewed the triage vital signs and the nursing notes.  Pertinent labs & imaging results that were available during my care of the patient were reviewed by me and considered in my medical decision making (see chart for details).     Patient is well-appearing, afebrile, nontoxic, nontachycardic.  Strep testing was obtained and was negative.  Will send this for culture but defer antibiotics until culture results are available.  Discussed likely viral etiology.  Offered COVID testing but patient declined this today.  Will treat symptomatically.  She was encouraged to use over-the-counter antihistamines and will add fluticasone nasal spray.  She was also given prescription for Promethazine DM we discussed that this can be sedating and she is not to drive or drink alcohol with taking it.  Encouraged her to rest and drink plenty of fluid.  If her symptoms or not improving within a week she is to return for reevaluation.  If she has any worsening symptoms she  needs to be seen immediately.  Strict return precautions given.  Patient declined work excuse note.  Final Clinical Impressions(s) / UC Diagnoses   Final diagnoses:  Upper respiratory tract infection, unspecified type  Acute cough  Sore throat     Discharge Instructions      You tested negative for strep.  We will contact you if your strep culture is positive we will need to start antibiotic.  I am concerned for a virus.  Continue allergy medication including fluticasone nasal spray and cetirizine daily.  Use nasal saline and sinus rinses for additional symptom relief.  Take Promethazine DM for cough.  This will make you sleepy so do not drive or drink alcohol while taking it.  If you have any worsening or changing symptoms or if your symptoms do not improve within a week please return for reevaluation.     ED Prescriptions     Medication Sig Dispense Auth. Provider   fluticasone (FLONASE) 50 MCG/ACT nasal spray Place 1 spray into both nostrils daily. 16 g [place] Dimmitt K, PA-C   promethazine-dextromethorphan (PROMETHAZINE-DM) 6.25-15 MG/5ML syrup Take 5 mLs by mouth 4 (four) times daily as needed for cough. 118 mL Jasdeep Dejarnett K, PA-C      PDMP not reviewed this encounter.   Jeani Hawking, PA-C 01/03/23 1941

## 2023-01-05 LAB — CULTURE, GROUP A STREP (THRC)

## 2023-01-06 LAB — CULTURE, GROUP A STREP (THRC)

## 2023-01-25 ENCOUNTER — Encounter: Payer: Self-pay | Admitting: Internal Medicine

## 2023-01-25 ENCOUNTER — Ambulatory Visit (INDEPENDENT_AMBULATORY_CARE_PROVIDER_SITE_OTHER): Payer: 59 | Admitting: Internal Medicine

## 2023-01-25 VITALS — BP 138/80 | HR 77 | Ht 64.0 in | Wt 168.4 lb

## 2023-01-25 DIAGNOSIS — E042 Nontoxic multinodular goiter: Secondary | ICD-10-CM | POA: Diagnosis not present

## 2023-01-25 DIAGNOSIS — E0590 Thyrotoxicosis, unspecified without thyrotoxic crisis or storm: Secondary | ICD-10-CM

## 2023-01-25 NOTE — Patient Instructions (Addendum)
Please stop at the lab.  You should have an endocrinology follow-up appointment in 1 year.

## 2023-01-25 NOTE — Progress Notes (Addendum)
Patient ID: Sherry Ray, female   DOB: 1980/07/17, 42 y.o.   MRN: [id number]  HPI  Sherry Ray is a 42 y.o.-year-old female, returning for follow-up for thyrotoxicosis and thyroid nodules.  She previously saw Dr. Everardo All, but last visit with me 6 months ago.  Interim history: She has tremors, mostly associated with anxiety, and also mentions thin hair and difficulty losing weight.  I reviewed pt's thyroid tests: Lab Results  Component Value Date   TSH 0.44 07/16/2022   TSH 0.22 (L) 02/05/2022   TSH 0.28 (L) 08/26/2021   TSH 0.35 02/24/2021   TSH 0.16 (L) 10/20/2020   TSH 0.27 (L) 08/19/2020   TSH 0.19 (L) 07/07/2020   TSH 0.22 (L) 07/02/2020   TSH 0.584 06/06/2018   TSH 0.716 07/14/2016   FREET4 1.3 07/16/2022   FREET4 1.2 02/05/2022   FREET4 0.85 08/26/2021   FREET4 0.88 02/24/2021   FREET4 0.74 10/20/2020   FREET4 0.76 08/19/2020   T3FREE 3.8 07/16/2022   T3FREE 3.8 02/05/2022   Lab Results  Component Value Date   T3FREE 3.8 07/16/2022   T3FREE 3.8 02/05/2022   Antithyroid antibodies: Lab Results  Component Value Date   TSI <89 02/05/2022   In 01/2022, I suggested a thyroid uptake and scan but she did not go through with it.  Thyroid U/S (11/20/2020): Parenchymal Echotexture: Mildly heterogenous  Isthmus: 0.5 cm  Right lobe: 6.8 x 2.3 x 3.0 cm  Left lobe: 6.5 x 1.7 x 2.2 cm _________________________________________________________   Nodule # 1:  Location: Right; Superior  Maximum size: 1.2 cm; Other 2 dimensions: 1.0 x 0.7 cm  Composition: solid/almost completely solid (2)  Echogenicity: hypoechoic (2)  *Given size (>/= 1 - 1.4 cm) and appearance, a follow-up ultrasound in 1 year should be considered based on TI-RADS criteria.  _________________________________________________________   Nodule # 2:  Location: Right; Inferior  Maximum size: 1.2 cm; Other 2 dimensions: 1.1 x 0.6 cm  Composition: solid/almost completely solid (2)  Echogenicity:  isoechoic (1)  Given size (<1.4 cm) and appearance, this nodule does NOT meet TI-RADS criteria for biopsy or dedicated follow-up.  _________________________________________________________   IMPRESSION: 1. Mildly enlarged, mildly heterogeneous thyroid gland. 2. Solid nodule in the right superior thyroid (labeled 1, 1.2 cm) meets criteria (TI-RADS category 4) for 1 year ultrasound surveillance.   Thyroid ultrasound (01/05/2022): Parenchymal Echotexture: Mildly heterogenous  Isthmus: 0.5 cm  Right lobe: 6.0 x 2.0 x 2.4 cm  Left lobe: 5.6 x 1.7 x 1.9 cm  _________________________________________________________   Nodule # 1: The previously identified 1.2 cm nodule in the right mid to upper gland has partially involuted since the prior study and now measures only 0.9 x 0.9 x 0.7 cm. This nodule no longer meets criteria to warrant further follow-up.   Nodule # 2:  Prior biopsy: No  Location: Right; Inferior  Maximum size: 1.2 cm; Other 2 dimensions: 1.1 x 0.6 cm, previously, 1.2 x 1.1 x 0.6 cm  Composition: solid/almost completely solid (2)  Echogenicity: hypoechoic (2) Change in features: Yes  Change in ACR TI-RADS risk category: Yes  *Given size (>/= 1 - 1.4 cm) and appearance, a follow-up ultrasound in 1 year should be considered based on TI-RADS criteria.  _________________________________________________________   No new nodules or suspicious features.   IMPRESSION: 1. Interval involution of the previously identified TI-RADS category 4 nodule in the right mid to upper gland. This lesion no longer meets criteria to warrant further evaluation. 2. However, the TI-RADS  category 3 nodule in the right inferior gland has become more hypoechoic in appearance and is now consistent with TI-RADS category 4 and meets criteria for imaging surveillance. Recommend follow-up ultrasound in 1 year.  Pt denies: - feeling nodules in neck - hoarseness - dysphagia - choking  She  denies: - excessive sweating/heat intolerance - anxiety - palpitations - hyperdefecation - weight loss   She has: - Tremors  Pt does not have a FH of thyroid ds. No FH of thyroid cancer. No h/o radiation tx to head or neck. No recent contrast studies. No herbal supplements. No Biotin use.  Pt. also has a history of Antithrombin deficiency with history of DVT and PE, headaches. She increased the Sertraline dose in 05/2022 >> feeling better. She has problems with strep throat, sinusitis. Saw ENT  -had repeated ABx courses. She gave birth in 2019, 2021.  ROS:  + see HPI  Past Medical History:  Diagnosis Date   Antithrombin deficiency ([name])    had a PE (2008) and DVT (1996), took coumadin for 1 year   Anxiety and depression    Depression    DVT (deep venous thrombosis) ([name])    Headache    History of cholestasis during pregnancy    Pulmonary embolism ([name])    Vaginal Pap smear, abnormal    Past Surgical History:  Procedure Laterality Date   CESAREAN SECTION     CESAREAN SECTION N/A 12/31/2019   Procedure: CESAREAN SECTION;  Surgeon: Harold Hedge, MD;  Location: [facility];  Service: Obstetrics;  Laterality: N/AHerbert Seta, RNFA   CHOLECYSTECTOMY N/A 03/31/2020   Procedure: LAPAROSCOPIC CHOLECYSTECTOMY;  Surgeon: Fritzi Mandes, MD;  Location: [facility] OR;  Service: General;  Laterality: N/A;   ESOPHAGOGASTRODUODENOSCOPY ENDOSCOPY     EYE SURGERY Left 04/30/2020   Corneal cross linking   FIRST RIB REMOVAL     LEEP     NASAL SINUS SURGERY     Social History   Socioeconomic History   Marital status: Married    Spouse name: Not on file   Number of children: Not on file   Years of education: Not on file   Highest education level: Not on file  Occupational History   Not on file  Tobacco Use   Smoking status: Never   Smokeless tobacco: Never  Vaping Use   Vaping status: Never Used  Substance and Sexual Activity   Alcohol use: No    Alcohol/week: 0.0 standard drinks of  alcohol   Drug use: No   Sexual activity: Yes    Birth control/protection: None  Other Topics Concern   Not on file  Social History Narrative   Not on file   Social Determinants of Health   Financial Resource Strain: Not on file  Food Insecurity: No Food Insecurity (01/20/2022)   Hunger Vital Sign    Worried About Running Out of Food in the Last Year: Never true    Ran Out of Food in the Last Year: Never true  Transportation Needs: Not on file  Physical Activity: Not on file  Stress: Not on file  Social Connections: Unknown (10/15/2021)   Received from [facility]   Social Network    Social Network: Not on file  Intimate Partner Violence: Unknown (09/17/2021)   Received from [facility], [facility]   HITS    Physically Hurt: Not on file    Insult or Talk Down To: Not on file    Threaten Physical Harm:  Not on file    Scream or Curse: Not on file   Current Outpatient Medications on File Prior to Visit  Medication Sig Dispense Refill   cetirizine (ZYRTEC) 10 MG tablet Take 10 mg by mouth daily.      fluconazole (DIFLUCAN) 150 MG tablet Take 1 tablet daily as directed; repeat after 72 hours; (Patient not taking: Reported on 07/16/2022) 2 tablet 1   fluticasone (FLONASE) 50 MCG/ACT nasal spray Place 1 spray into both nostrils daily. 16 g 0   levonorgestrel (MIRENA) 20 MCG/24HR IUD 1 each by Intrauterine route once.     promethazine-dextromethorphan (PROMETHAZINE-DM) 6.25-15 MG/5ML syrup Take 5 mLs by mouth 4 (four) times daily as needed for cough. 118 mL 0   sertraline (ZOLOFT) 100 MG tablet Take 2 tablets (200 mg total) by mouth daily. 180 tablet 3   No current facility-administered medications on file prior to visit.   No Known Allergies Family History  Problem Relation Age of Onset   Cancer Father    Thyroid disease Neg Hx    PE: BP 138/80   Pulse 77   Ht 5\' 4"  (1.626 m)   Wt 168 lb 6.4 oz (76.4 kg)   SpO2 97%   BMI 28.91 kg/m  Wt Readings from Last  3 Encounters:  01/25/23 168 lb 6.4 oz (76.4 kg)  07/16/22 164 lb (74.4 kg)  05/20/22 168 lb (76.2 kg)   Constitutional: Slightly overweight, in NAD Eyes:  EOMI, no exophthalmos ENT: no neck masses, no cervical lymphadenopathy Cardiovascular: RRR, No MRG Respiratory: CTA B Musculoskeletal: no deformities Skin:no rashes Neurological: + fine tremor with outstretched hands  ASSESSMENT: 1.  Subclinical thyrotoxicosis  2.  Thyroid nodules  PLAN:  1. Patient with history of mildly low TSH with normal free thyroid hormones, without thyrotoxic symptoms other than tremors. She denied weight loss, heat intolerance, hyperdefecation, palpitations, anxiety. -She did not appear to have exogenous causes for the low TSH -We discussed about possible causes for thyrotoxicosis: Pregnancy related thyrotoxicosis Graves ds -TSI antibodies were not elevated, but our working diagnosis is mild Graves' disease Thyroiditis -including postpartum thyroiditis -she gave birth few months prior to the test becoming abnormal.  However, TSH persisted to be low afterwards toxic multinodular goiter/ toxic adenoma -she does have 2 thyroid nodules which we are following -At last visit, TFTs were normal, so we did not proceed with a thyroid uptake and scan -We discussed the possible modalities of treatment for the above conditions were: Methimazole use, RAI treatment, or, last resort, surgery.  We did not have to use any of the above as TFTs normalized at last check -She was tachycardic at the last 2 visits but her pulse is normal today - RTC in 6 months or possibly sooner for labs  2.  Thyroid nodules -No neck compression symptoms and no neck masses palpated -Patient with 2 small thyroid nodules, which were initially evaluated by ultrasound in 11/2020.  One of the nodules (right) met criteria for 1 year follow-up.  She had another nodule (right inferior) for which no follow-up was needed.  We repeated a thyroid ultrasound  in 12/2021 and this showed that the right superior/mid thyroid nodule involuted and did not measure more than 0.9 cm, without the need for follow-up.  However, the right inferior nodule appeared to be hypoechoic while previously was isoechoic.  Therefore, follow-up in 1 year was recommended. -At today's visit, will order the new ultrasound  Component     Latest Ref Rng 01/25/2023  TSH     0.35 - 5.50 uIU/mL 0.32 (L)   T4,Free(Direct)     0.60 - 1.60 ng/dL 1.61   Triiodothyronine,Free,Serum     2.3 - 4.2 pg/mL 2.9   TSH is slightly low, with normal free T4 and free T3.  I would suggest to go ahead with a thyroid uptake and scan for now.  I will check with her.  Thyroid uptake and scan (02/17/2023) - resulted 03/04/2023: Uniform uptake in mildly enlarged thyroid gland. Pyramidal lobe potentially subtly evident.   4 hour I-123 uptake = 4.7% (normal 5-20%) 24 hour I-123 uptake = 7.9% (normal 10-30%)   IMPRESSION: 1. Uniform uptake in the thyroid gland.  No nodularity. 2. Low normal I 123 uptake. 3. Visually the gland appears slightly enlarged and the uptake visually appears greater than the measured uptake.   Uniform scan with possible pyramidal lobe evident -consistent with Graves' disease, however, her uptake is lower than normal so for now, RAI treatment is not indicated.  Carlus Pavlov, MD PhD [facility] Endocrinology

## 2023-01-26 ENCOUNTER — Ambulatory Visit
Admission: RE | Admit: 2023-01-26 | Discharge: 2023-01-26 | Disposition: A | Discharge status: Home / Self Care | Payer: 59 | Source: Ambulatory Visit | Attending: Internal Medicine | Admitting: Internal Medicine

## 2023-01-26 VITALS — BP 141/83 | HR 133 | Temp 101.9°F | Resp 16

## 2023-01-26 DIAGNOSIS — Z1152 Encounter for screening for COVID-19: Secondary | ICD-10-CM | POA: Diagnosis not present

## 2023-01-26 DIAGNOSIS — Z20818 Contact with and (suspected) exposure to other bacterial communicable diseases: Secondary | ICD-10-CM | POA: Insufficient documentation

## 2023-01-26 DIAGNOSIS — J029 Acute pharyngitis, unspecified: Secondary | ICD-10-CM | POA: Diagnosis present

## 2023-01-26 LAB — POCT RAPID STREP A (OFFICE): Rapid Strep A Screen: NEGATIVE

## 2023-01-26 LAB — POCT INFLUENZA A/B
Influenza A, POC: NEGATIVE
Influenza B, POC: NEGATIVE

## 2023-01-26 MED ORDER — AMOXICILLIN-POT CLAVULANATE 875-125 MG PO TABS
1.0000 | ORAL_TABLET | Freq: Two times a day (BID) | ORAL | 0 refills | Status: AC
Start: 2023-01-26 — End: 2023-02-05

## 2023-01-26 MED ORDER — ACETAMINOPHEN 325 MG PO TABS
650.0000 mg | ORAL_TABLET | Freq: Once | ORAL | Status: AC
  Administered 2023-01-26: 650 mg via ORAL

## 2023-01-26 NOTE — ED Provider Notes (Signed)
[facility]    CSN: [id number] Arrival date & time: 01/26/23  1755      History   Chief Complaint Chief Complaint  Patient presents with   Sore Throat    Entered by patient    HPI Sherry Ray is a 42 y.o. female  presents for evaluation of URI symptoms for 1 days. Patient reports associated symptoms of fevers, chills, body aches, sore throat, headache, diarrhea. Denies N/V/D, cough, congestion, ear pain, shortness of breath. Patient does not have a hx of asthma or smoking.  Patient states has been had strep throat a week and a half ago.  Pt has taken ibuprofen OTC for symptoms. Pt has no other concerns at this time.    Sore Throat Associated symptoms include headaches.    Past Medical History:  Diagnosis Date   Antithrombin deficiency [facility])    had a PE (2008) and DVT (1996), took coumadin for 1 year   Anxiety and depression    Depression    DVT (deep venous thrombosis) ([name])    Headache    History of cholestasis during pregnancy    Pulmonary embolism ([name])    Vaginal Pap smear, abnormal     Patient Active Problem List   Diagnosis Date Noted   Over weight 04/12/2022   Thyroid nodule 08/26/2021   Hyperthyroidism 08/19/2020   History of cesarean section 12/31/2019   Term pregnancy 12/31/2019   Upper respiratory tract infection 07/05/2019   Antithrombin deficiency ([name]) 07/14/2016   Depression with anxiety 12/17/2015    Past Surgical History:  Procedure Laterality Date   CESAREAN SECTION     CESAREAN SECTION N/A 12/31/2019   Procedure: CESAREAN SECTION;  Surgeon: Harold Hedge, MD;  Location: [facility]S;  Service: Obstetrics;  Laterality: N/AHerbert Seta, RNFA   CHOLECYSTECTOMY N/A 03/31/2020   Procedure: LAPAROSCOPIC CHOLECYSTECTOMY;  Surgeon: Fritzi Mandes, MD;  Location: [facility] OR;  Service: General;  Laterality: N/A;   ESOPHAGOGASTRODUODENOSCOPY ENDOSCOPY     EYE SURGERY Left 04/30/2020   Corneal cross linking   FIRST RIB REMOVAL     LEEP      NASAL SINUS SURGERY      OB History     Gravida  2   Para  2   Term  2   Preterm      AB      Living  2      SAB      IAB      Ectopic      Multiple  0   Live Births  1            Home Medications    Prior to Admission medications   Medication Sig Start Date End Date Taking? Authorizing Provider  amoxicillin-clavulanate (AUGMENTIN) 875-125 MG tablet Take 1 tablet by mouth every 12 (twelve) hours for 10 days. 01/26/23 02/05/23 Yes Radford Pax, NP  cetirizine (ZYRTEC) 10 MG tablet Take 10 mg by mouth daily.     [provider]  fluconazole (DIFLUCAN) 150 MG tablet Take 1 tablet daily as directed; repeat after 72 hours; Patient not taking: Reported on 07/16/2022 05/20/22   Olive Bass, FNP  fluticasone [facility]) 50 MCG/ACT nasal spray Place 1 spray into both nostrils daily. 01/03/23   Raspet, Noberto Retort, PA-C  levonorgestrel (MIRENA) 20 MCG/24HR IUD 1 each by Intrauterine route once.    [provider]  promethazine-dextromethorphan (PROMETHAZINE-DM) 6.25-15 MG/5ML syrup Take 5 mLs by mouth 4 (four) times  daily as needed for cough. Patient not taking: Reported on 01/25/2023 01/03/23   Raspet, Denny Peon K, PA-C  sertraline (ZOLOFT) 100 MG tablet Take 2 tablets (200 mg total) by mouth daily. 05/20/22   Olive Bass, FNP    Family History Family History  Problem Relation Age of Onset   Cancer Father    Thyroid disease Neg Hx     Social History Social History   Tobacco Use   Smoking status: Never   Smokeless tobacco: Never  Vaping Use   Vaping status: Never Used  Substance Use Topics   Alcohol use: No    Alcohol/week: 0.0 standard drinks of alcohol   Drug use: No     Allergies   Patient has no known allergies.   Review of Systems Review of Systems  Constitutional:  Positive for chills and fever.  HENT:  Positive for sore throat.   Gastrointestinal:  Positive for diarrhea.  Musculoskeletal:  Positive for myalgias.   Neurological:  Positive for headaches.     Physical Exam Triage Vital Signs ED Triage Vitals  Encounter Vitals Group     BP 01/26/23 1803 (!) 141/83     Systolic BP Percentile --      Diastolic BP Percentile --      Pulse Rate 01/26/23 1803 (!) 133     Resp 01/26/23 1803 16     Temp 01/26/23 1803 (!) 103.1 F (39.5 C)     Temp Source 01/26/23 1803 Oral     SpO2 01/26/23 1803 95 %     Weight --      Height --      Head Circumference --      Peak Flow --      Pain Score 01/26/23 1806 3     Pain Loc --      Pain Education --      Exclude from Growth Chart --    No data found.  Updated Vital Signs BP (!) 141/83 (BP Location: Right Arm)   Pulse (!) 133   Temp (!) 103.1 F (39.5 C) (Oral)   Resp 16   SpO2 95%   Visual Acuity Right Eye Distance:   Left Eye Distance:   Bilateral Distance:    Right Eye Near:   Left Eye Near:    Bilateral Near:     Physical Exam Vitals and nursing note reviewed.  Constitutional:      General: She is not in acute distress.    Appearance: She is well-developed. She is not ill-appearing.  HENT:     Head: Normocephalic and atraumatic.     Right Ear: Tympanic membrane and ear canal normal.     Left Ear: Tympanic membrane and ear canal normal.     Nose: Congestion present.     Mouth/Throat:     Mouth: Mucous membranes are moist.     Pharynx: Oropharynx is clear. Uvula midline. Posterior oropharyngeal erythema present.     Tonsils: No tonsillar exudate or tonsillar abscesses.  Eyes:     Conjunctiva/sclera: Conjunctivae normal.     Pupils: Pupils are equal, round, and reactive to light.  Cardiovascular:     Rate and Rhythm: Normal rate and regular rhythm.     Heart sounds: Normal heart sounds.  Pulmonary:     Effort: Pulmonary effort is normal.     Breath sounds: Normal breath sounds.  Musculoskeletal:     Cervical back: Normal range of motion and neck supple.  Lymphadenopathy:  Cervical: Cervical adenopathy present.   Skin:    General: Skin is warm and dry.  Neurological:     General: No focal deficit present.     Mental Status: She is alert and oriented to person, place, and time.  Psychiatric:        Mood and Affect: Mood normal.        Behavior: Behavior normal.     Centor criteria   Tonsillar exudates 0  Tender anterior cervical lymphadenopathy 1  Fever 1  Absence of cough 1  The Centor criteria are used to determine the likelihood of GAS in adults. One point is given for each criterion;  the likelihood of GAS pharyngitis increases as total points rise.  We generally test for GAS in patients with ?3 Centor criteria; patients with Centor criteria <3 are unlikely to have GAS pharyngitis    UC Treatments / Results  Labs (all labs ordered are listed, but only abnormal results are displayed) Labs Reviewed  CULTURE, GROUP A STREP ([facility])  SARS CORONAVIRUS 2 (TAT 6-24 HRS)  POCT RAPID STREP A (OFFICE)  POCT INFLUENZA A/B    EKG   Radiology No results found.  Procedures Procedures (including critical care time)  Medications Ordered in UC Medications  acetaminophen (TYLENOL) tablet 650 mg (650 mg Oral Given 01/26/23 1809)    Initial Impression / Assessment and Plan / UC Course  I have reviewed the triage vital signs and the nursing notes.  Pertinent labs & imaging results that were available during my care of the patient were reviewed by me and considered in my medical decision making (see chart for details).     Negative rapid strep and rapid flu.  Will send strep throat culture.  COVID PCR and will contact if positive.  Given patient presentation, Centor score and known exposure to strep throat will cover with Augmentin while awaiting results.  Patient states amoxicillin does not work for her.  Salt water gargles/warm liquids and OTC analgesics as needed.  PCP follow-up if symptoms do not improve.  ER precautions reviewed and patient verbalized understanding Final Clinical  Impressions(s) / UC Diagnoses   Final diagnoses:  Sore throat  Exposure to strep throat  Acute pharyngitis, unspecified etiology     Discharge Instructions      The clinic will contact you with results of the COVID test as well as a strep throat culture done today if they are positive.  Given your symptoms and your strep throat exposure please start Augmentin twice daily for 10 days.  You may continue Tylenol ibuprofen as needed for fever management.  Salt water gargles and warm liquids as needed.  Lots of rest and fluids.  Please follow-up with your PCP if your symptoms or not improving.  Please go to the ER for any worsening symptoms.  I hope you feel better soon!     ED Prescriptions     Medication Sig Dispense Auth. Provider   amoxicillin-clavulanate (AUGMENTIN) 875-125 MG tablet Take 1 tablet by mouth every 12 (twelve) hours for 10 days. 20 tablet Radford Pax, NP      PDMP not reviewed this encounter.   Radford Pax, NP 01/26/23 [phone]

## 2023-01-26 NOTE — ED Triage Notes (Signed)
Pt presents to UC w/ c/o headache, sore throat, body aches, chills, diarrhea since yesterday. Pt took advil to help with aches.

## 2023-01-26 NOTE — Discharge Instructions (Signed)
The clinic will contact you with results of the COVID test as well as a strep throat culture done today if they are positive.  Given your symptoms and your strep throat exposure please start Augmentin twice daily for 10 days.  You may continue Tylenol ibuprofen as needed for fever management.  Salt water gargles and warm liquids as needed.  Lots of rest and fluids.  Please follow-up with your PCP if your symptoms or not improving.  Please go to the ER for any worsening symptoms.  I hope you feel better soon!

## 2023-01-27 ENCOUNTER — Encounter: Payer: Self-pay | Admitting: Internal Medicine

## 2023-01-27 LAB — SARS CORONAVIRUS 2 (TAT 6-24 HRS): SARS Coronavirus 2: NEGATIVE

## 2023-01-28 ENCOUNTER — Encounter: Payer: Self-pay | Admitting: Internal Medicine

## 2023-01-31 ENCOUNTER — Other Ambulatory Visit: Payer: Self-pay | Admitting: Internal Medicine

## 2023-01-31 DIAGNOSIS — E0590 Thyrotoxicosis, unspecified without thyrotoxic crisis or storm: Secondary | ICD-10-CM

## 2023-01-31 DIAGNOSIS — E042 Nontoxic multinodular goiter: Secondary | ICD-10-CM

## 2023-02-08 ENCOUNTER — Other Ambulatory Visit: Payer: Self-pay

## 2023-02-08 ENCOUNTER — Inpatient Hospital Stay: Payer: 59 | Attending: Hematology and Oncology | Admitting: Hematology and Oncology

## 2023-02-08 VITALS — BP 137/89 | HR 101 | Temp 97.7°F | Resp 18 | Ht 64.0 in | Wt 168.0 lb

## 2023-02-08 DIAGNOSIS — Z803 Family history of malignant neoplasm of breast: Secondary | ICD-10-CM

## 2023-02-08 DIAGNOSIS — Z9189 Other specified personal risk factors, not elsewhere classified: Secondary | ICD-10-CM | POA: Diagnosis present

## 2023-02-08 NOTE — Progress Notes (Signed)
[facility] CONSULT NOTE  Patient Care Team: Olive Bass, FNP as PCP - General (Internal Medicine)  CHIEF COMPLAINTS/PURPOSE OF CONSULTATION:  At high risk for breast cancer  HISTORY OF PRESENTING ILLNESS:  Sherry Ray 42 y.o. female is here because of recent diagnosis of being at high risk for breast cancer.  Patient's mother and grandmother both had breast cancers and she had genetic testing through [facility] which did not reveal any gene mutations.  However because of her family history she was noted to have a high lifetime risk of breast cancer of over 30% and she was referred to [place] for evaluation.  She has never had a mammogram in her life.  She denies any pain or discomfort in the breast. I had seen her long time ago for Antithrombin III deficiency during her second pregnancy I reviewed her records extensively and collaborated the history with the patient.    MEDICAL HISTORY:  Past Medical History:  Diagnosis Date   Antithrombin deficiency ([name])    had a PE (2008) and DVT (1996), took coumadin for 1 year   Anxiety and depression    Depression    DVT (deep venous thrombosis) ([name])    Headache    History of cholestasis during pregnancy    Pulmonary embolism ([name])    Vaginal Pap smear, abnormal     SURGICAL HISTORY: Past Surgical History:  Procedure Laterality Date   CESAREAN SECTION     CESAREAN SECTION N/A 12/31/2019   Procedure: CESAREAN SECTION;  Surgeon: Harold Hedge, MD;  Location: [facility] LD ORS;  Service: Obstetrics;  Laterality: N/AHerbert Seta, RNFA   CHOLECYSTECTOMY N/A 03/31/2020   Procedure: LAPAROSCOPIC CHOLECYSTECTOMY;  Surgeon: Fritzi Mandes, MD;  Location: [facility] OR;  Service: General;  Laterality: N/A;   ESOPHAGOGASTRODUODENOSCOPY ENDOSCOPY     EYE SURGERY Left 04/30/2020   Corneal cross linking   FIRST RIB REMOVAL     LEEP     NASAL SINUS SURGERY      SOCIAL HISTORY: Social History   Socioeconomic History   Marital status:  Married    Spouse name: Not on file   Number of children: Not on file   Years of education: Not on file   Highest education level: Not on file  Occupational History   Not on file  Tobacco Use   Smoking status: Never   Smokeless tobacco: Never  Vaping Use   Vaping status: Never Used  Substance and Sexual Activity   Alcohol use: No    Alcohol/week: 0.0 standard drinks of alcohol   Drug use: No   Sexual activity: Yes    Birth control/protection: None  Other Topics Concern   Not on file  Social History Narrative   Not on file   Social Determinants of Health   Financial Resource Strain: Not on file  Food Insecurity: No Food Insecurity (01/20/2022)   Hunger Vital Sign    Worried About Running Out of Food in the Last Year: Never true    Ran Out of Food in the Last Year: Never true  Transportation Needs: Not on file  Physical Activity: Not on file  Stress: Not on file  Social Connections: Unknown (10/15/2021)   Received from [facility], [facility]   Social Network    Social Network: Not on file  Intimate Partner Violence: Unknown (09/17/2021)   Received from [facility], [facility]   HITS    Physically Hurt: Not on file    Insult  or Talk Down To: Not on file    Threaten Physical Harm: Not on file    Scream or Curse: Not on file    FAMILY HISTORY: Family History  Problem Relation Age of Onset   Cancer Father    Thyroid disease Neg Hx     ALLERGIES:  has No Known Allergies.  MEDICATIONS:  Current Outpatient Medications  Medication Sig Dispense Refill   cetirizine (ZYRTEC) 10 MG tablet Take 10 mg by mouth daily.      fluconazole (DIFLUCAN) 150 MG tablet Take 1 tablet daily as directed; repeat after 72 hours; (Patient not taking: Reported on 07/16/2022) 2 tablet 1   fluticasone (FLONASE) 50 MCG/ACT nasal spray Place 1 spray into both nostrils daily. 16 g 0   levonorgestrel (MIRENA) 20 MCG/24HR IUD 1 each by Intrauterine route once.      promethazine-dextromethorphan (PROMETHAZINE-DM) 6.25-15 MG/5ML syrup Take 5 mLs by mouth 4 (four) times daily as needed for cough. (Patient not taking: Reported on 01/25/2023) 118 mL 0   sertraline (ZOLOFT) 100 MG tablet Take 2 tablets (200 mg total) by mouth daily. 180 tablet 3   No current facility-administered medications for this visit.    REVIEW OF SYSTEMS:   Constitutional: Denies fevers, chills or abnormal night sweats Breast:  Denies any palpable lumps or discharge All other systems were reviewed with the patient and are negative.  PHYSICAL EXAMINATION: ECOG PERFORMANCE STATUS: 0 - Asymptomatic  Vitals:   02/08/23 1601  BP: 137/89  Pulse: (!) 101  Resp: 18  Temp: 97.7 F (36.5 C)  SpO2: 99%   Filed Weights   02/08/23 1601  Weight: 168 lb (76.2 kg)    GENERAL:alert, no distress and comfortable    LABORATORY DATA:  I have reviewed the data as listed Lab Results  Component Value Date   WBC 8.1 07/02/2020   HGB 13.2 07/02/2020   HCT 40.0 07/02/2020   MCV 81.7 07/02/2020   PLT 330.0 07/02/2020   Lab Results  Component Value Date   NA 138 07/02/2020   K 3.9 07/02/2020   CL 103 07/02/2020   CO2 26 07/02/2020    RADIOGRAPHIC STUDIES: I have personally reviewed the radiological reports and agreed with the findings in the report.  ASSESSMENT AND PLAN:  At high risk for breast cancer Family history of breast cancer: Mother and grandmother had breast cancers. Myriad test: No genetic mutation: 34% lifetime risk  1.  Risk assessment: ADondra Spry model: 5-year risk: 1.2% versus 0.7% B.  Tyrer-Cuzick model: 10-year risk: 5.3% versus 1.9% C.  Tyrer-Cuzick model: Lifetime risk: 27.9% versus 10.6%  2. Risk reduction: A.  Pharmacological risk reduction: Tamoxifen versus raloxifene: Based upon risks and benefits I did not recommend it. B.  Nonpharmacological risk reduction: Stressed importance of eating healthy, diet, exercise, supplements like vitamin D and avoiding  alcohol  3.  Breast cancer surveillance: A.  Mammograms annually B.  Breast MRIs annually and after a few negative MRIs we can consider doing MRIs every other year.  I will call her after the breast MRI result is available. Telephone visit in 1 year for follow-up.     All questions were answered. The patient knows to call the clinic with any problems, questions or concerns.    Tamsen Meek, MD 02/08/23

## 2023-02-08 NOTE — Assessment & Plan Note (Addendum)
Family history of breast cancer: Mother and grandmother had breast cancers. Myriad test: No genetic mutation: 34% lifetime risk  1.  Risk assessment: ADondra Spry model: 5-year risk: 1.2% versus 0.7% B.  Tyrer-Cuzick model: 10-year risk: 5.3% versus 1.9% C.  Tyrer-Cuzick model: Lifetime risk: 27.9% versus 10.6%  2. Risk reduction: A.  Pharmacological risk reduction: Tamoxifen versus raloxifene: Based upon risks and benefits I did not recommend it. B.  Nonpharmacological risk reduction: Stressed importance of eating healthy, diet, exercise, supplements like vitamin D and avoiding alcohol  3.  Breast cancer surveillance: A.  Mammograms annually B.  Breast MRIs annually and after a few negative MRIs we can consider doing MRIs every other year.  I will call her after the breast MRI result is available. Telephone visit in 1 year for follow-up.

## 2023-02-16 ENCOUNTER — Telehealth: Payer: Self-pay | Admitting: Hematology and Oncology

## 2023-02-16 NOTE — Telephone Encounter (Signed)
 Scheduled appointment per los. Left voicemail with appointment details.

## 2023-02-17 ENCOUNTER — Encounter (HOSPITAL_COMMUNITY)
Admission: RE | Admit: 2023-02-17 | Discharge: 2023-02-17 | Disposition: A | Discharge status: Home / Self Care | Payer: 59 | Source: Ambulatory Visit | Attending: Internal Medicine | Admitting: Internal Medicine

## 2023-02-17 DIAGNOSIS — E0590 Thyrotoxicosis, unspecified without thyrotoxic crisis or storm: Secondary | ICD-10-CM | POA: Insufficient documentation

## 2023-02-17 DIAGNOSIS — E042 Nontoxic multinodular goiter: Secondary | ICD-10-CM | POA: Diagnosis present

## 2023-02-17 MED ORDER — SODIUM IODIDE I-123 7.4 MBQ CAPS
456.0000 | ORAL_CAPSULE | Freq: Once | ORAL | Status: AC
  Administered 2023-02-17: 456 via ORAL

## 2023-02-18 ENCOUNTER — Encounter (HOSPITAL_COMMUNITY)
Admission: RE | Admit: 2023-02-18 | Discharge: 2023-02-18 | Disposition: A | Discharge status: Home / Self Care | Payer: 59 | Source: Ambulatory Visit | Attending: Internal Medicine | Admitting: Internal Medicine

## 2023-03-01 NOTE — Telephone Encounter (Signed)
From what I can see, its still not released is that correct?

## 2023-04-14 ENCOUNTER — Ambulatory Visit
Admission: RE | Admit: 2023-04-14 | Discharge: 2023-04-14 | Disposition: A | Discharge status: Home / Self Care | Payer: 59 | Source: Ambulatory Visit | Attending: Hematology and Oncology | Admitting: Hematology and Oncology

## 2023-04-14 DIAGNOSIS — Z9189 Other specified personal risk factors, not elsewhere classified: Secondary | ICD-10-CM

## 2023-04-15 ENCOUNTER — Ambulatory Visit: Payer: 59 | Admitting: Family

## 2023-06-21 ENCOUNTER — Other Ambulatory Visit: Payer: Self-pay | Admitting: Family

## 2023-06-24 ENCOUNTER — Ambulatory Visit: Payer: 59 | Admitting: Family

## 2023-06-30 ENCOUNTER — Ambulatory Visit: Payer: 59 | Admitting: Family

## 2023-07-05 ENCOUNTER — Ambulatory Visit (INDEPENDENT_AMBULATORY_CARE_PROVIDER_SITE_OTHER): Payer: 59 | Admitting: Family

## 2023-07-05 ENCOUNTER — Encounter: Payer: Self-pay | Admitting: Family

## 2023-07-05 VITALS — BP 156/92 | HR 82 | Ht 64.0 in | Wt 169.4 lb

## 2023-07-05 DIAGNOSIS — R030 Elevated blood-pressure reading, without diagnosis of hypertension: Secondary | ICD-10-CM | POA: Diagnosis not present

## 2023-07-05 DIAGNOSIS — R519 Headache, unspecified: Secondary | ICD-10-CM | POA: Diagnosis not present

## 2023-07-05 DIAGNOSIS — E0590 Thyrotoxicosis, unspecified without thyrotoxic crisis or storm: Secondary | ICD-10-CM | POA: Diagnosis not present

## 2023-07-05 MED ORDER — METOPROLOL SUCCINATE ER 25 MG PO TB24: 25.0000 mg | ORAL_TABLET | Freq: Every day | ORAL | 0 refills | Status: DC

## 2023-07-05 NOTE — Addendum Note (Signed)
Addended by: Thelma Barge D on: 07/05/2023 05:22 PM   Modules accepted: Orders

## 2023-07-05 NOTE — Progress Notes (Signed)
Sherry Ray is a 43 y.o. female with the following history as recorded in Epic[employer]:  Patient Active Problem List   Diagnosis Date Noted   At high risk for breast cancer 02/08/2023   Over weight 04/12/2022   Thyroid nodule 08/26/2021   Hyperthyroidism 08/19/2020   History of cesarean section 12/31/2019   Term pregnancy 12/31/2019   Upper respiratory tract infection 07/05/2019   Antithrombin deficiency ([name]) 07/14/2016   Depression with anxiety 12/17/2015    Current Outpatient Medications  Medication Sig Dispense Refill   cetirizine (ZYRTEC) 10 MG tablet Take 10 mg by mouth daily.      fluticasone (FLONASE) 50 MCG/ACT nasal spray Place 1 spray into both nostrils daily. 16 g 0   levonorgestrel (MIRENA) 20 MCG/24HR IUD 1 each by Intrauterine route once.     metoprolol succinate (TOPROL-XL) 25 MG 24 hr tablet Take 1 tablet (25 mg total) by mouth daily. 90 tablet 0   sertraline (ZOLOFT) 100 MG tablet TAKE TWO TABLETS BY MOUTH DAILY 180 tablet 0   No current facility-administered medications for this visit.    Allergies: Patient has no known allergies.  Past Medical History:  Diagnosis Date   Antithrombin deficiency ([name])    had a PE (2008) and DVT (1996), took coumadin for 1 year   Anxiety and depression    Depression    DVT (deep venous thrombosis) ([name])    Headache    History of cholestasis during pregnancy    Pulmonary embolism ([name])    Vaginal Pap smear, abnormal     Past Surgical History:  Procedure Laterality Date   CESAREAN SECTION     CESAREAN SECTION N/A 12/31/2019   Procedure: CESAREAN SECTION;  Surgeon: Harold Hedge, MD;  Location: [facility] LD ORS;  Service: Obstetrics;  Laterality: N/A;  [name], RNFA   CHOLECYSTECTOMY N/A 03/31/2020   Procedure: LAPAROSCOPIC CHOLECYSTECTOMY;  Surgeon: Fritzi Mandes, MD;  Location: [facility] OR;  Service: General;  Laterality: N/A;   ESOPHAGOGASTRODUODENOSCOPY ENDOSCOPY     EYE SURGERY Left 04/30/2020   Corneal cross linking   FIRST  RIB REMOVAL     LEEP     NASAL SINUS SURGERY      Family History  Problem Relation Age of Onset   Breast cancer Mother    Cancer Father    Breast cancer Maternal Grandmother    Thyroid disease Neg Hx     Social History   Tobacco Use   Smoking status: Never   Smokeless tobacco: Never  Substance Use Topics   Alcohol use: No    Alcohol/week: 0.0 standard drinks of alcohol    Subjective:   Does have history of migraine headaches- started age 1; notes that over "Christmas break" had more frequent headaches/ notes that the patterns were different; did feel that headaches were more noticeable with periods of exertion; noticed with recently chasing kids; does not check her blood pressure regularly and is surprised to see it was elevated;   LMP- last week/ did not have any headache when period was on; has IUD in place;     Objective:  Vitals:   07/05/23 1340  BP: (!) 156/92  Pulse: 82  SpO2: 99%  Weight: 169 lb 6.4 oz (76.8 kg)  Height: 5\' 4"  (1.626 m)    General: Well developed, well nourished, in no acute distress  Skin : Warm and dry.  Head: Normocephalic and atraumatic  Eyes: Sclera and conjunctiva clear; pupils round and reactive to light; extraocular movements intact  Ears: External normal; canals clear; tympanic membranes normal  Oropharynx: Pink, supple. No suspicious lesions  Neck: Supple without thyromegaly, adenopathy  Lungs: Respirations unlabored; clear to auscultation bilaterally without wheeze, rales, rhonchi  CVS exam: normal rate and regular rhythm.  Extremities: No edema, cyanosis, clubbing  Vessels: Symmetric bilaterally  Neurologic: Alert and oriented; speech intact; face symmetrical; moves all extremities well; CNII-XII intact without focal deficit   Assessment:  1. Worsening headaches   2. Hyperthyroidism   3. Elevated blood pressure reading     Plan:  ? If worsening headaches are secondary to thyroid condition or separate issue; will update brain  MRI and refer to neurology; will repeat thyroid profile today- to consider having patient follow up with her endocrinologist to discuss treatment options; in the interim, will also try Toprol XL 25 mg daily; discussed this can help with anxiety, blood pressure and headaches; follow up to be determined;   No follow-ups on file.  Orders Placed This Encounter  Procedures   MR Brain Wo Contrast    Standing Status:   Future    Expiration Date:   07/04/2024    What is the patient's sedation requirement?:   No Sedation    Does the patient have a pacemaker or implanted devices?:   No    Preferred imaging location?:   [facility] (table limit-550lbs)   CBC with Differential/Platelet   Comp Met (CMET)   Thyroid Panel With TSH   Ambulatory referral to Neurology    Referral Priority:   Routine    Referral Type:   Consultation    Referral Reason:   Specialty Services Required    Requested Specialty:   Neurology    Number of Visits Requested:   1    Requested Prescriptions   Signed Prescriptions Disp Refills   metoprolol succinate (TOPROL-XL) 25 MG 24 hr tablet 90 tablet 0    Sig: Take 1 tablet (25 mg total) by mouth daily.

## 2023-07-06 ENCOUNTER — Encounter: Payer: Self-pay | Admitting: Family

## 2023-07-06 LAB — THYROID PANEL WITH TSH
Free Thyroxine Index: 2.1 (ref 1.4–3.8)
T3 Uptake: 30 % (ref 22–35)
T4, Total: 7.1 ug/dL (ref 5.1–11.9)
TSH: 0.33 m[IU]/L — ABNORMAL LOW

## 2023-07-07 LAB — CBC WITH DIFFERENTIAL/PLATELET
Absolute Lymphocytes: 2852 {cells}/uL (ref 850–3900)
Absolute Monocytes: 403 {cells}/uL (ref 200–950)
Basophils Absolute: 24 {cells}/uL (ref 0–200)
Basophils Relative: 0.3 %
Eosinophils Absolute: 221 {cells}/uL (ref 15–500)
Eosinophils Relative: 2.8 %
HCT: 44.1 % (ref 35.0–45.0)
Hemoglobin: 14.5 g/dL (ref 11.7–15.5)
MCH: 27.8 pg (ref 27.0–33.0)
MCHC: 32.9 g/dL (ref 32.0–36.0)
MCV: 84.6 fL (ref 80.0–100.0)
MPV: 11 fL (ref 7.5–12.5)
Monocytes Relative: 5.1 %
Neutro Abs: 4400 {cells}/uL (ref 1500–7800)
Neutrophils Relative %: 55.7 %
Platelets: 300 10*3/uL (ref 140–400)
RBC: 5.21 10*6/uL — ABNORMAL HIGH (ref 3.80–5.10)
RDW: 12.7 % (ref 11.0–15.0)
Total Lymphocyte: 36.1 %
WBC: 7.9 10*3/uL (ref 3.8–10.8)

## 2023-07-07 LAB — COMPREHENSIVE METABOLIC PANEL
AG Ratio: 1.5 (calc) (ref 1.0–2.5)
ALT: 31 U/L — ABNORMAL HIGH (ref 6–29)
AST: 23 U/L (ref 10–30)
Albumin: 4.5 g/dL (ref 3.6–5.1)
Alkaline phosphatase (APISO): 82 U/L (ref 31–125)
BUN: 12 mg/dL (ref 7–25)
CO2: 25 mmol/L (ref 20–32)
Calcium: 9.4 mg/dL (ref 8.6–10.2)
Chloride: 102 mmol/L (ref 98–110)
Creat: 0.8 mg/dL (ref 0.50–0.99)
Globulin: 3.1 g/dL (ref 1.9–3.7)
Glucose, Bld: 113 mg/dL — ABNORMAL HIGH (ref 65–99)
Potassium: 4.1 mmol/L (ref 3.5–5.3)
Sodium: 138 mmol/L (ref 135–146)
Total Bilirubin: 0.3 mg/dL (ref 0.2–1.2)
Total Protein: 7.6 g/dL (ref 6.1–8.1)

## 2023-07-07 LAB — THYROID PANEL WITH TSH
Free Thyroxine Index: 2.1 (ref 1.4–3.8)
T3 Uptake: 29 % (ref 22–35)
T4, Total: 7.2 ug/dL (ref 5.1–11.9)
TSH: 0.33 m[IU]/L — ABNORMAL LOW

## 2023-07-25 ENCOUNTER — Encounter: Payer: Self-pay | Admitting: Internal Medicine

## 2023-07-25 ENCOUNTER — Ambulatory Visit (INDEPENDENT_AMBULATORY_CARE_PROVIDER_SITE_OTHER): Payer: 59 | Admitting: Internal Medicine

## 2023-07-25 VITALS — BP 124/60 | HR 96 | Ht 64.0 in | Wt 168.6 lb

## 2023-07-25 DIAGNOSIS — E042 Nontoxic multinodular goiter: Secondary | ICD-10-CM | POA: Diagnosis not present

## 2023-07-25 DIAGNOSIS — E0590 Thyrotoxicosis, unspecified without thyrotoxic crisis or storm: Secondary | ICD-10-CM | POA: Diagnosis not present

## 2023-07-25 DIAGNOSIS — E0500 Thyrotoxicosis with diffuse goiter without thyrotoxic crisis or storm: Secondary | ICD-10-CM

## 2023-07-25 NOTE — Patient Instructions (Addendum)
 Please come back for labs in another month.  Let's check another thyroid  ultrasound.  You should have an endocrinology follow-up appointment in 6 months.

## 2023-07-25 NOTE — Progress Notes (Addendum)
Patient ID: Sherry Ray, female   DOB: Oct 25, 1980, 43 y.o.   MRN: [id number]  HPI  Sherry Ray is a 43 y.o.-year-old female, returning for follow-up for thyrotoxicosis and thyroid nodules.  She previously saw Dr. Everardo All, but last visit with me 6 months ago.  Interim history: She continues to have tremors, mostly associated with anxiety.  At today's visit, she has sinus drainage.  She also has headaches. She has fatigue, some tremors.  She was started on Metoprolol by PCP in 06/2023.   I reviewed pt's thyroid tests: Lab Results  Component Value Date   TSH 0.33 (L) 07/05/2023   TSH 0.33 (L) 07/05/2023   TSH 0.32 (L) 01/25/2023   TSH 0.44 07/16/2022   TSH 0.22 (L) 02/05/2022   TSH 0.28 (L) 08/26/2021   TSH 0.35 02/24/2021   TSH 0.16 (L) 10/20/2020   TSH 0.27 (L) 08/19/2020   TSH 0.19 (L) 07/07/2020   FREET4 0.84 01/25/2023   FREET4 1.3 07/16/2022   FREET4 1.2 02/05/2022   FREET4 0.85 08/26/2021   FREET4 0.88 02/24/2021   FREET4 0.74 10/20/2020   FREET4 0.76 08/19/2020   T3FREE 2.9 01/25/2023   T3FREE 3.8 07/16/2022   T3FREE 3.8 02/05/2022   Lab Results  Component Value Date   T3FREE 2.9 01/25/2023   T3FREE 3.8 07/16/2022   T3FREE 3.8 02/05/2022   Antithyroid antibodies: Lab Results  Component Value Date   TSI <89 02/05/2022   Thyroid U/S (11/20/2020): Parenchymal Echotexture: Mildly heterogenous  Isthmus: 0.5 cm  Right lobe: 6.8 x 2.3 x 3.0 cm  Left lobe: 6.5 x 1.7 x 2.2 cm _________________________________________________________   Nodule # 1:  Location: Right; Superior  Maximum size: 1.2 cm; Other 2 dimensions: 1.0 x 0.7 cm  Composition: solid/almost completely solid (2)  Echogenicity: hypoechoic (2)  *Given size (>/= 1 - 1.4 cm) and appearance, a follow-up ultrasound in 1 year should be considered based on TI-RADS criteria.  _________________________________________________________   Nodule # 2:  Location: Right; Inferior  Maximum size: 1.2  cm; Other 2 dimensions: 1.1 x 0.6 cm  Composition: solid/almost completely solid (2)  Echogenicity: isoechoic (1)  Given size (<1.4 cm) and appearance, this nodule does NOT meet TI-RADS criteria for biopsy or dedicated follow-up.  _________________________________________________________   IMPRESSION: 1. Mildly enlarged, mildly heterogeneous thyroid gland. 2. Solid nodule in the right superior thyroid (labeled 1, 1.2 cm) meets criteria (TI-RADS category 4) for 1 year ultrasound surveillance.   Thyroid ultrasound (01/05/2022): Parenchymal Echotexture: Mildly heterogenous  Isthmus: 0.5 cm  Right lobe: 6.0 x 2.0 x 2.4 cm  Left lobe: 5.6 x 1.7 x 1.9 cm  _________________________________________________________   Nodule # 1: The previously identified 1.2 cm nodule in the right mid to upper gland has partially involuted since the prior study and now measures only 0.9 x 0.9 x 0.7 cm. This nodule no longer meets criteria to warrant further follow-up.   Nodule # 2:  Prior biopsy: No  Location: Right; Inferior  Maximum size: 1.2 cm; Other 2 dimensions: 1.1 x 0.6 cm, previously, 1.2 x 1.1 x 0.6 cm  Composition: solid/almost completely solid (2)  Echogenicity: hypoechoic (2) Change in features: Yes  Change in ACR TI-RADS risk category: Yes  *Given size (>/= 1 - 1.4 cm) and appearance, a follow-up ultrasound in 1 year should be considered based on TI-RADS criteria.  _________________________________________________________   No new nodules or suspicious features.   IMPRESSION: 1. Interval involution of the previously identified TI-RADS category 4 nodule  in the right mid to upper gland. This lesion no longer meets criteria to warrant further evaluation. 2. However, the TI-RADS category 3 nodule in the right inferior gland has become more hypoechoic in appearance and is now consistent with TI-RADS category 4 and meets criteria for imaging surveillance. Recommend follow-up ultrasound in  1 year.  In 01/2022, I suggested a thyroid uptake and scan but she did not go through with it, but she had this in 02/2023: Thyroid uptake and scan (02/17/2023) - resulted 03/04/2023: Uniform uptake in mildly enlarged thyroid gland. Pyramidal lobe potentially subtly evident.   4 hour I-123 uptake = 4.7% (normal 5-20%) 24 hour I-123 uptake = 7.9% (normal 10-30%)   IMPRESSION: 1. Uniform uptake in the thyroid gland.  No nodularity. 2. Low normal I 123 uptake. 3. Visually the gland appears slightly enlarged and the uptake visually appears greater than the measured uptake.   Uniform scan with possible pyramidal lobe evident -consistent with Graves' disease, however, her uptake is lower than normal so for now, RAI treatment is not indicated.  Pt denies: - feeling nodules in neck - hoarseness - dysphagia - choking  She denies: - excessive sweating/heat intolerance - anxiety - palpitations - hyperdefecation - weight loss   She has: - Tremors  Pt does not have a FH of thyroid ds. No FH of thyroid cancer. No h/o radiation tx to head or neck. No recent contrast studies. No herbal supplements. No Biotin use.  Pt. also has a history of Antithrombin deficiency with history of DVT and PE, headaches. She increased the Sertraline dose in 05/2022 >> feeling better. She has problems with strep throat, sinusitis. Saw ENT  -had repeated ABx courses. She gave birth in 2019, 2021.  ROS:  + see HPI  Past Medical History:  Diagnosis Date   Antithrombin deficiency ([name])    had a PE (2008) and DVT (1996), took coumadin for 1 year   Anxiety and depression    Depression    DVT (deep venous thrombosis) ([name])    Headache    History of cholestasis during pregnancy    Pulmonary embolism ([name])    Vaginal Pap smear, abnormal    Past Surgical History:  Procedure Laterality Date   CESAREAN SECTION     CESAREAN SECTION N/A 12/31/2019   Procedure: CESAREAN SECTION;  Surgeon: Harold Hedge, MD;   Location: [facility];  Service: Obstetrics;  Laterality: N/AHerbert Seta, RNFA   CHOLECYSTECTOMY N/A 03/31/2020   Procedure: LAPAROSCOPIC CHOLECYSTECTOMY;  Surgeon: Fritzi Mandes, MD;  Location: [facility];  Service: General;  Laterality: N/A;   ESOPHAGOGASTRODUODENOSCOPY ENDOSCOPY     EYE SURGERY Left 04/30/2020   Corneal cross linking   FIRST RIB REMOVAL     LEEP     NASAL SINUS SURGERY     Social History   Socioeconomic History   Marital status: Married    Spouse name: Not on file   Number of children: Not on file   Years of education: Not on file   Highest education level: Not on file  Occupational History   Not on file  Tobacco Use   Smoking status: Never   Smokeless tobacco: Never  Vaping Use   Vaping status: Never Used  Substance and Sexual Activity   Alcohol use: No    Alcohol/week: 0.0 standard drinks of alcohol   Drug use: No   Sexual activity: Yes    Birth control/protection: None  Other Topics Concern   Not on file  Social History Narrative   Not on file   Social Drivers of Health   Financial Resource Strain: Not on file  Food Insecurity: No Food Insecurity (01/20/2022)   Hunger Vital Sign    Worried About Running Out of Food in the Last Year: Never true    Ran Out of Food in the Last Year: Never true  Transportation Needs: Not on file  Physical Activity: Not on file  Stress: Not on file  Social Connections: Unknown (10/15/2021)   Received from [facility], [facility]   Social Network    Social Network: Not on file  Intimate Partner Violence: Unknown (09/17/2021)   Received from [facility], [facility]   HITS    Physically Hurt: Not on file    Insult or Talk Down To: Not on file    Threaten Physical Harm: Not on file    Scream or Curse: Not on file   Current Outpatient Medications on File Prior to Visit  Medication Sig Dispense Refill   cetirizine (ZYRTEC) 10 MG tablet Take 10 mg by mouth daily.      fluticasone (FLONASE) 50 MCG/ACT nasal spray  Place 1 spray into both nostrils daily. 16 g 0   levonorgestrel (MIRENA) 20 MCG/24HR IUD 1 each by Intrauterine route once.     metoprolol succinate (TOPROL-XL) 25 MG 24 hr tablet Take 1 tablet (25 mg total) by mouth daily. 90 tablet 0   sertraline (ZOLOFT) 100 MG tablet TAKE TWO TABLETS BY MOUTH DAILY 180 tablet 0   No current facility-administered medications on file prior to visit.   No Known Allergies Family History  Problem Relation Age of Onset   Breast cancer Mother    Cancer Father    Breast cancer Maternal Grandmother    Thyroid disease Neg Hx    PE: BP 124/60   Pulse 96   Ht 5\' 4"  (1.626 m)   Wt 168 lb 9.6 oz (76.5 kg)   SpO2 96%   BMI 28.94 kg/m  Wt Readings from Last 3 Encounters:  07/25/23 168 lb 9.6 oz (76.5 kg)  07/05/23 169 lb 6.4 oz (76.8 kg)  02/08/23 168 lb (76.2 kg)   Constitutional: Slightly overweight, in NAD Eyes:  EOMI, no exophthalmos ENT: no neck masses, no cervical lymphadenopathy Cardiovascular: Tachycardia, RR, No MRG Respiratory: CTA B Musculoskeletal: no deformities Skin:no rashes Neurological: + fine tremor with outstretched hands  ASSESSMENT: 1.  Subclinical thyrotoxicosis  2.  Thyroid nodules  PLAN:  1. Patient with history of mildly low TSH with normal free thyroid hormones, without thyrotoxic symptoms other than tremors.  No weight loss, heat intolerance, hyperdefecation, palpitations, anxiety -She did not have exogenous causes for the low TSH -We previously discussed that possible causes for thyrotoxicosis are: Pregnancy related thyrotoxicosis Graves' disease-TSI antibodies were not elevated but our working diagnosis was mild Graves' disease.  Since last visit, we were able to obtain thyroid uptake and scan and this showed a uniformed scan, with a low normal uptake but the pyramidal lobe, which we discussed is usually seen in Graves' disease. Thyroiditis-including postpartum.  She gave birth few months prior to the test becoming  abnormal.  However, TSH persisted in being low afterwards. Toxic multinodular goiter/toxic adenoma-she did have 2 thyroid nodules which we are following, however, on the thyroid uptake and scan, they were not "hot" -At today's visit, we reviewed together the thyroid uptake and scan and discussed that she appears to have mild Graves' disease, which is resolving -Indeed, the  latest TSH was only minimally low -So far, she did not require treatment but we did discuss about possible treatments for Graves' disease to include methimazole, RAI treatment, or surgery as a last resort. -Will recheck her TFTs in approximately 1 month - RTC in 6 months or possibly sooner for labs  2.  Thyroid nodules -No neck compression symptoms or masses felt on palpation of her neck today -She has 2 small thyroid nodules which were initially evaluated by ultrasound in 11/2020.  A right thyroid nodule met criteria for 1 year follow-up.  She had a right inferior nodule for which no follow-up was needed.  We repeated her thyroid ultrasound 12/2021 and this showed the right superior/mid thyroid nodule involuted and did not measure more than 0.9 cm, without the need for follow-up.  However, the right inferior nodule appeared to be hypoechoic, while previously was isoechoic. -Will repeat the ultrasound now  Orders Placed This Encounter  Procedures   US THYROID   TSH   T4, free   T3, free   Thyroid U/S (08/01/2023): Parenchymal Echotexture: Mildly heterogeneous  Isthmus: 0.6 cm ,previously 0.5 cm  Right lobe: 6.1 x 1.7 x 2.5 cm ,previously 6.0 x 2.0 x 2.4 cm  Left lobe: 5.5 x 1.3 x 1.9 cm , previously 5.6 x 1.7 x 1.9 cm  ________________________________________________________   Estimated total number of nodules >/= 1 cm: 1  _________________________________________________________   Continued decreased size of benign-appearing nodule in the right mid thyroid (labeled 1, 0.6 cm, previously 0.9 cm).   Nodule # 2:   Prior biopsy: No  Location: Right; Inferior  Maximum size: 1.0 cm; Other 2 dimensions: 0.8 x 0.6 cm, previously, 1.2 x 1.1 x 0.6 cm  Composition: solid/almost completely solid (2)  Echogenicity: hypoechoic (2) ACR TI-RADS risk category:  TR4 (4-6 points). *Given size (>/= 1 - 1.4 cm) and appearance, a follow-up ultrasound in 1 year should be considered based on TI-RADS criteria.  _________________________________________________________   No cervical lymphadenopathy.   IMPRESSION: Slight interval decreased size of previously visualized right inferior solid thyroid nodule (labeled 2, 1.0 cm, previously 1.2 cm) which again meets criteria (TI-RADS category 4) for 1 year ultrasound surveillance. This study marks 1 year stability.  Carlus Pavlov, MD PhD [facility]

## 2023-08-01 ENCOUNTER — Ambulatory Visit
Admission: RE | Admit: 2023-08-01 | Discharge: 2023-08-01 | Disposition: A | Discharge status: Home / Self Care | Payer: 59 | Source: Ambulatory Visit | Attending: Internal Medicine | Admitting: Internal Medicine

## 2023-08-01 DIAGNOSIS — E042 Nontoxic multinodular goiter: Secondary | ICD-10-CM

## 2023-08-05 ENCOUNTER — Encounter: Payer: Self-pay | Admitting: Internal Medicine

## 2023-08-25 ENCOUNTER — Other Ambulatory Visit: Payer: 59

## 2023-08-26 LAB — T4, FREE: Free T4: 1.2 ng/dL (ref 0.8–1.8)

## 2023-08-26 LAB — T3, FREE: T3, Free: 3.3 pg/mL (ref 2.3–4.2)

## 2023-08-26 LAB — TSH: TSH: 0.42 m[IU]/L

## 2023-08-29 ENCOUNTER — Encounter: Payer: Self-pay | Admitting: Internal Medicine

## 2023-10-10 ENCOUNTER — Ambulatory Visit: Payer: 59 | Admitting: Neurology

## 2023-10-28 ENCOUNTER — Ambulatory Visit (INDEPENDENT_AMBULATORY_CARE_PROVIDER_SITE_OTHER): Admitting: Family

## 2023-10-28 ENCOUNTER — Encounter: Payer: Self-pay | Admitting: Family

## 2023-10-28 ENCOUNTER — Ambulatory Visit: Admitting: Family

## 2023-10-28 VITALS — BP 144/92 | HR 91 | Ht 64.0 in | Wt 168.0 lb

## 2023-10-28 DIAGNOSIS — F418 Other specified anxiety disorders: Secondary | ICD-10-CM | POA: Diagnosis not present

## 2023-10-28 MED ORDER — BUSPIRONE HCL 5 MG PO TABS: 5.0000 mg | ORAL_TABLET | Freq: Two times a day (BID) | ORAL | 0 refills | Status: DC

## 2023-10-28 NOTE — Progress Notes (Signed)
 Sherry Ray is a 43 y.o. female with the following history as recorded in [employer]:  Patient Active Problem List   Diagnosis Date Noted   At high risk for breast cancer 02/08/2023   Over weight 04/12/2022   Thyroid  nodule 08/26/2021   Hyperthyroidism 08/19/2020   History of cesarean section 12/31/2019   Term pregnancy 12/31/2019   Upper respiratory tract infection 07/05/2019   Antithrombin deficiency ([name]) 07/14/2016   Depression with anxiety 12/17/2015    Current Outpatient Medications  Medication Sig Dispense Refill   busPIRone (BUSPAR) 5 MG tablet Take 1 tablet (5 mg total) by mouth 2 (two) times daily. 60 tablet 0   cetirizine (ZYRTEC) 10 MG tablet Take 10 mg by mouth daily.      fluticasone  (FLONASE ) 50 MCG/ACT nasal spray Place 1 spray into both nostrils daily. 16 g 0   levonorgestrel (MIRENA) 20 MCG/24HR IUD 1 each by Intrauterine route once.     metoprolol  succinate (TOPROL -XL) 25 MG 24 hr tablet Take 1 tablet (25 mg total) by mouth daily. 90 tablet 0   sertraline  (ZOLOFT ) 100 MG tablet TAKE TWO TABLETS BY MOUTH DAILY 180 tablet 0   No current facility-administered medications for this visit.    Allergies: Patient has no known allergies.  Past Medical History:  Diagnosis Date   Antithrombin deficiency ([name])    had a PE (2008) and DVT (1996), took coumadin for 1 year   Anxiety and depression    Depression    DVT (deep venous thrombosis) ([name])    Headache    History of cholestasis during pregnancy    Pulmonary embolism ([name])    Vaginal Pap smear, abnormal     Past Surgical History:  Procedure Laterality Date   CESAREAN SECTION     CESAREAN SECTION N/A 12/31/2019   Procedure: CESAREAN SECTION;  Surgeon: Thora Flint, MD;  Location: [facility] LD ORS;  Service: Obstetrics;  Laterality: N/A;  Pattie Borders, RNFA   CHOLECYSTECTOMY N/A 03/31/2020   Procedure: LAPAROSCOPIC CHOLECYSTECTOMY;  Surgeon: Lujean Sake, MD;  Location: [facility] OR;  Service: General;  Laterality: N/A;    ESOPHAGOGASTRODUODENOSCOPY ENDOSCOPY     EYE SURGERY Left 04/30/2020   Corneal cross linking   FIRST RIB REMOVAL     LEEP     NASAL SINUS SURGERY      Family History  Problem Relation Age of Onset   Breast cancer Mother    Cancer Father    Breast cancer Maternal Grandmother    Thyroid  disease Neg Hx     Social History   Tobacco Use   Smoking status: Never   Smokeless tobacco: Never  Substance Use Topics   Alcohol use: No    Alcohol/week: 0.0 standard drinks of alcohol    Subjective:   Patient is having increased anxiety issues- dealing with very difficult personal/ family situation; is taking Zoloft  200 mg daily but does not feel working for anxiety; is working with counselor who recommended patient to come to discuss options;   Objective:  Vitals:   10/28/23 1425  BP: (!) 144/92  Pulse: 91  SpO2: 98%  Weight: 168 lb (76.2 kg)  Height: 5\' 4"  (1.626 m)    General: Well developed, well nourished, in no acute distress  Skin : Warm and dry.  Head: Normocephalic and atraumatic  Lungs: Respirations unlabored;  Neurologic: Alert and oriented; speech intact; face symmetrical; moves all extremities well; CNII-XII intact without focal deficit   Assessment:  1. Situational anxiety  Plan:  Continue Zoloft  200 mg daily; encouraged patient to start taking Toprol  XL again; can try adding Buspar 5 mg bid; call back next week to discuss response to Buspar- can increase dosage as needed; continue with therapist; will consider changing medication once personal issue has resolved;   Time spent 30 minutes  No follow-ups on file.  No orders of the defined types were placed in this encounter.   Requested Prescriptions   Signed Prescriptions Disp Refills   busPIRone (BUSPAR) 5 MG tablet 60 tablet 0    Sig: Take 1 tablet (5 mg total) by mouth 2 (two) times daily.

## 2023-10-28 NOTE — Patient Instructions (Signed)
 Please let me hear from you by Tuesday to know how you are doing;   I do think the Toprol  XL is a good medication to re-start at night;

## 2023-11-01 ENCOUNTER — Encounter: Payer: Self-pay | Admitting: Family

## 2023-11-22 ENCOUNTER — Encounter: Payer: Self-pay | Admitting: Family

## 2023-11-22 ENCOUNTER — Other Ambulatory Visit: Payer: Self-pay | Admitting: Family

## 2023-11-24 ENCOUNTER — Encounter: Payer: Self-pay | Admitting: Family

## 2023-11-24 ENCOUNTER — Other Ambulatory Visit: Payer: Self-pay | Admitting: Family

## 2023-12-01 ENCOUNTER — Encounter: Payer: Self-pay | Admitting: Family

## 2023-12-26 ENCOUNTER — Other Ambulatory Visit: Payer: Self-pay | Admitting: Family

## 2024-01-03 ENCOUNTER — Other Ambulatory Visit: Payer: Self-pay | Admitting: Family

## 2024-01-26 ENCOUNTER — Ambulatory Visit: Payer: 59 | Admitting: Internal Medicine

## 2024-02-08 ENCOUNTER — Telehealth: Payer: 59 | Admitting: Hematology and Oncology

## 2024-02-14 ENCOUNTER — Inpatient Hospital Stay: Attending: Hematology and Oncology | Admitting: Hematology and Oncology

## 2024-02-14 DIAGNOSIS — Z803 Family history of malignant neoplasm of breast: Secondary | ICD-10-CM

## 2024-02-14 DIAGNOSIS — Z9189 Other specified personal risk factors, not elsewhere classified: Secondary | ICD-10-CM

## 2024-02-14 DIAGNOSIS — Z1379 Encounter for other screening for genetic and chromosomal anomalies: Secondary | ICD-10-CM

## 2024-02-14 NOTE — Progress Notes (Signed)
 HEMATOLOGY-ONCOLOGY TELEPHONE VISIT PROGRESS NOTE  I connected with our patient on 02/14/24 at  3:00 PM EDT by telephone and verified that I am speaking with the correct person using two identifiers.  I discussed the limitations, risks, security and privacy concerns of performing an evaluation and management service by telephone and the availability of in person appointments.  I also discussed with the patient that there may be a patient responsible charge related to this service. The patient expressed understanding and agreed to proceed.   History of Present Illness: Surveillance of breast cancer  History of Present Illness Sherry Ray is a 43 year old female who presents for follow-up regarding her breast imaging schedule.  She has not been keeping up with her breast imaging schedule. Her last mammogram was on November 4th of the previous year, and she requires annual mammograms around the same time each year. She has not yet scheduled her upcoming mammogram.    REVIEW OF SYSTEMS:   Constitutional: Denies fevers, chills or abnormal weight loss All other systems were reviewed with the patient and are negative. Observations/Objective:     Assessment Plan:  At high risk for breast cancer Family history of breast cancer: Mother and grandmother had breast cancers. Myriad test: No genetic mutation: 34% lifetime risk   1.  Risk assessment: ASABRA Kubas model: 5-year risk: 1.2% versus 0.7% B.  Tyrer-Cuzick model: 10-year risk: 5.3% versus 1.9% C.  Tyrer-Cuzick model: Lifetime risk: 27.9% versus 10.6% ----------------------------------------------------------------------------- Breast Cancer Surveillance: Mammogram: 04/18/23: Benign Density C MRI Breast: Will be requested for May 2026 Telephone visit in 1 year to discuss surveillance    I discussed the assessment and treatment plan with the patient. The patient was provided an opportunity to ask questions and all were answered.  The patient agreed with the plan and demonstrated an understanding of the instructions. The patient was advised to call back or seek an in-person evaluation if the symptoms worsen or if the condition fails to improve as anticipated.   I provided 20 minutes of non-face-to-face time during this encounter.  This includes time for charting and coordination of care   Naomi MARLA Chad, MD

## 2024-02-14 NOTE — Assessment & Plan Note (Signed)
 Family history of breast cancer: Mother and grandmother had breast cancers. Myriad test: No genetic mutation: 34% lifetime risk   1.  Risk assessment: ASABRA Kubas model: 5-year risk: 1.2% versus 0.7% B.  Tyrer-Cuzick model: 10-year risk: 5.3% versus 1.9% C.  Tyrer-Cuzick model: Lifetime risk: 27.9% versus 10.6% ----------------------------------------------------------------------------- Breast Cancer Surveillance: Mammogram: 04/17/24: Benign Density C MRI Breast:

## 2024-02-24 ENCOUNTER — Ambulatory Visit: Admitting: Family

## 2024-02-24 ENCOUNTER — Encounter: Payer: Self-pay | Admitting: Family

## 2024-02-24 VITALS — BP 128/80 | HR 75 | Temp 98.4°F | Resp 12 | Ht 64.0 in | Wt 168.8 lb

## 2024-02-24 DIAGNOSIS — F411 Generalized anxiety disorder: Secondary | ICD-10-CM

## 2024-02-24 DIAGNOSIS — Z1159 Encounter for screening for other viral diseases: Secondary | ICD-10-CM

## 2024-02-24 DIAGNOSIS — F419 Anxiety disorder, unspecified: Secondary | ICD-10-CM

## 2024-02-24 DIAGNOSIS — Z0000 Encounter for general adult medical examination without abnormal findings: Secondary | ICD-10-CM | POA: Diagnosis not present

## 2024-02-24 DIAGNOSIS — Z23 Encounter for immunization: Secondary | ICD-10-CM

## 2024-02-24 NOTE — Progress Notes (Signed)
 Sherry Ray is a 43 y.o. female with the following history as recorded in EpicCare:  Patient Active Problem List   Diagnosis Date Noted   At high risk for breast cancer 02/08/2023   Over weight 04/12/2022   Thyroid  nodule 08/26/2021   Hyperthyroidism 08/19/2020   History of cesarean section 12/31/2019   Term pregnancy 12/31/2019   Upper respiratory tract infection 07/05/2019   Antithrombin deficiency ([name]) 07/14/2016   Depression with anxiety 12/17/2015    Current Outpatient Medications  Medication Sig Dispense Refill   busPIRone  (BUSPAR ) 5 MG tablet Take 1 tablet (5 mg total) by mouth 2 (two) times daily. (Patient taking differently: Take 5 mg by mouth at bedtime as needed.) 180 tablet 0   cetirizine (ZYRTEC) 10 MG tablet Take 10 mg by mouth daily.      fluticasone  (FLONASE ) 50 MCG/ACT nasal spray Place 1 spray into both nostrils daily. 16 g 0   levonorgestrel (MIRENA) 20 MCG/24HR IUD 1 each by Intrauterine route once.     sertraline  (ZOLOFT ) 100 MG tablet Take 2 tablets (200 mg total) by mouth daily. 180 tablet 0   metoprolol  succinate (TOPROL -XL) 25 MG 24 hr tablet Take 1 tablet (25 mg total) by mouth daily. (Patient not taking: Reported on 02/24/2024) 90 tablet 0   No current facility-administered medications for this visit.    Allergies: Patient has no known allergies.  Past Medical History:  Diagnosis Date   Antithrombin deficiency ([name])    had a PE (2008) and DVT (1996), took coumadin for 1 year   Anxiety and depression    Depression    DVT (deep venous thrombosis) ([name])    Headache    History of cholestasis during pregnancy    Pulmonary embolism ([name])    Vaginal Pap smear, abnormal     Past Surgical History:  Procedure Laterality Date   CESAREAN SECTION     CESAREAN SECTION N/A 12/31/2019   Procedure: CESAREAN SECTION;  Surgeon: Curlene Agent, MD;  Location: [facility];  Service: Obstetrics;  Laterality: N/AMERL Moats, RNFA   CHOLECYSTECTOMY N/A 03/31/2020    Procedure: LAPAROSCOPIC CHOLECYSTECTOMY;  Surgeon: Dasie Leonor CROME, MD;  Location: [facility] OR;  Service: General;  Laterality: N/A;   ESOPHAGOGASTRODUODENOSCOPY ENDOSCOPY     EYE SURGERY Left 04/30/2020   Corneal cross linking   FIRST RIB REMOVAL     LEEP     NASAL SINUS SURGERY      Family History  Problem Relation Age of Onset   Breast cancer Mother    Cancer Father    Breast cancer Maternal Grandmother    Thyroid  disease Neg Hx     Social History   Tobacco Use   Smoking status: Never   Smokeless tobacco: Never  Substance Use Topics   Alcohol use: No    Alcohol/week: 0.0 standard drinks of alcohol    Subjective:   Presents for yearly CPE; continuing to work with her endocrinologist and breast specialist; she is not taking the Buspar  much at all at this point- only occasionally at night and would like to get referral to psychiatrist;  [facility];  Review of Systems  Constitutional: Negative.   HENT: Negative.    Eyes: Negative.   Respiratory: Negative.  Negative for sputum production.   Cardiovascular: Negative.   Gastrointestinal: Negative.   Genitourinary: Negative.   Musculoskeletal: Negative.   Skin: Negative.   Neurological: Negative.   Endo/Heme/Allergies: Negative.   Psychiatric/Behavioral:  Positive for depression. The  patient is nervous/anxious.      Objective:  Vitals:   02/24/24 1545  BP: 128/80  Pulse: 75  Resp: 12  Temp: 98.4 F (36.9 C)  TempSrc: Oral  SpO2: 96%  Weight: 168 lb 12.8 oz (76.6 kg)  Height: 5' 4 (1.626 m)    General: Well developed, well nourished, in no acute distress  Skin : Warm and dry.  Head: Normocephalic and atraumatic  Eyes: Sclera and conjunctiva clear; pupils round and reactive to light; extraocular movements intact  Ears: External normal; canals clear; tympanic membranes normal  Oropharynx: Pink, supple. No suspicious lesions  Neck: Supple without thyromegaly, adenopathy  Lungs: Respirations unlabored;  clear to auscultation bilaterally without wheeze, rales, rhonchi  CVS exam: normal rate and regular rhythm.  Abdomen: Soft; nontender; nondistended; normoactive bowel sounds; no masses or hepatosplenomegaly  Musculoskeletal: No deformities; no active joint inflammation  Extremities: No edema, cyanosis, clubbing  Vessels: Symmetric bilaterally  Neurologic: Alert and oriented; speech intact; face symmetrical; moves all extremities well; CNII-XII intact without focal deficit   Assessment:  1. PE (physical exam), annual   2. Influenza vaccine administered   3. Anxiety   4. GAD (generalized anxiety disorder)   5. Need for hepatitis C screening test     Plan:  Age appropriate preventive healthcare needs addressed; encouraged regular eye doctor and dental exams; encouraged regular exercise; will update labs and refills as needed today; follow-up with new PCP- contact information given for [facility];  Discussed need to limit using Buspar  and Zoloft  for long term- patient is not using Buspar  regularly but agree that psychiatrist referral could be very beneficial to discuss medication options;    No follow-ups on file.  Orders Placed This Encounter  Procedures   Flu vaccine trivalent PF, 6mos and older(Flulaval,Afluria,Fluarix,Fluzone)   Ambulatory referral to Psychiatry    Referral Priority:   Routine    Referral Type:   Psychiatric    Referral Reason:   Specialty Services Required    Requested Specialty:   Psychiatry    Number of Visits Requested:   1    Requested Prescriptions    No prescriptions requested or ordered in this encounter

## 2024-03-09 ENCOUNTER — Ambulatory Visit: Admitting: Internal Medicine

## 2024-03-13 ENCOUNTER — Encounter: Payer: Self-pay | Admitting: Internal Medicine

## 2024-03-13 ENCOUNTER — Ambulatory Visit (INDEPENDENT_AMBULATORY_CARE_PROVIDER_SITE_OTHER): Admitting: Internal Medicine

## 2024-03-13 VITALS — BP 118/76 | HR 97 | Ht 64.0 in | Wt 169.5 lb

## 2024-03-13 DIAGNOSIS — E042 Nontoxic multinodular goiter: Secondary | ICD-10-CM | POA: Diagnosis not present

## 2024-03-13 DIAGNOSIS — E0500 Thyrotoxicosis with diffuse goiter without thyrotoxic crisis or storm: Secondary | ICD-10-CM

## 2024-03-13 LAB — T3, FREE: T3, Free: 3.6 pg/mL (ref 2.3–4.2)

## 2024-03-13 LAB — T4, FREE: Free T4: 1.2 ng/dL (ref 0.8–1.8)

## 2024-03-13 LAB — TSH: TSH: 0.47 m[IU]/L

## 2024-03-13 MED ORDER — METOPROLOL SUCCINATE ER 25 MG PO TB24: 25.0000 mg | ORAL_TABLET | Freq: Every day | ORAL | 1 refills | Status: AC

## 2024-03-13 NOTE — Patient Instructions (Addendum)
 Please stop at the lab.  Please return in 1 year for a visit but in 6 months for labs.

## 2024-03-13 NOTE — Progress Notes (Signed)
 Patient ID: Sherry Ray, female   DOB: May 04, 1981, 43 y.o.   MRN: [id number]  HPI  Sherry Ray is a 43 y.o.-year-old female, returning for follow-up for thyrotoxicosis and thyroid  nodules.  She previously saw Dr. Kassie, but last visit with me 6 months ago.  Interim history: She continues to have tremors, mostly associated with anxiety.  Off metoprolol  after running out.  I reviewed pt's thyroid  tests: Lab Results  Component Value Date   TSH 0.42 08/25/2023   TSH 0.33 (L) 07/05/2023   TSH 0.33 (L) 07/05/2023   TSH 0.32 (L) 01/25/2023   TSH 0.44 07/16/2022   TSH 0.22 (L) 02/05/2022   TSH 0.28 (L) 08/26/2021   TSH 0.35 02/24/2021   TSH 0.16 (L) 10/20/2020   TSH 0.27 (L) 08/19/2020   FREET4 1.2 08/25/2023   FREET4 0.84 01/25/2023   FREET4 1.3 07/16/2022   FREET4 1.2 02/05/2022   FREET4 0.85 08/26/2021   FREET4 0.88 02/24/2021   FREET4 0.74 10/20/2020   FREET4 0.76 08/19/2020   T3FREE 3.3 08/25/2023   T3FREE 2.9 01/25/2023   T3FREE 3.8 07/16/2022   T3FREE 3.8 02/05/2022   Lab Results  Component Value Date   T3FREE 3.3 08/25/2023   T3FREE 2.9 01/25/2023   T3FREE 3.8 07/16/2022   T3FREE 3.8 02/05/2022   Antithyroid antibodies: Lab Results  Component Value Date   TSI <89 02/05/2022   Thyroid  U/S (11/20/2020): Parenchymal Echotexture: Mildly heterogenous  Isthmus: 0.5 cm  Right lobe: 6.8 x 2.3 x 3.0 cm  Left lobe: 6.5 x 1.7 x 2.2 cm _________________________________________________________   Nodule # 1:  Location: Right; Superior  Maximum size: 1.2 cm; Other 2 dimensions: 1.0 x 0.7 cm  Composition: solid/almost completely solid (2)  Echogenicity: hypoechoic (2)  *Given size (>/= 1 - 1.4 cm) and appearance, a follow-up ultrasound in 1 year should be considered based on TI-RADS criteria.  _________________________________________________________   Nodule # 2:  Location: Right; Inferior  Maximum size: 1.2 cm; Other 2 dimensions: 1.1 x 0.6 cm   Composition: solid/almost completely solid (2)  Echogenicity: isoechoic (1)  Given size (<1.4 cm) and appearance, this nodule does NOT meet TI-RADS criteria for biopsy or dedicated follow-up.  _________________________________________________________   IMPRESSION: 1. Mildly enlarged, mildly heterogeneous thyroid  gland. 2. Solid nodule in the right superior thyroid  (labeled 1, 1.2 cm) meets criteria (TI-RADS category 4) for 1 year ultrasound surveillance.   Thyroid  ultrasound (01/05/2022): Parenchymal Echotexture: Mildly heterogenous  Isthmus: 0.5 cm  Right lobe: 6.0 x 2.0 x 2.4 cm  Left lobe: 5.6 x 1.7 x 1.9 cm  _________________________________________________________   Nodule # 1: The previously identified 1.2 cm nodule in the right mid to upper gland has partially involuted since the prior study and now measures only 0.9 x 0.9 x 0.7 cm. This nodule no longer meets criteria to warrant further follow-up.   Nodule # 2:  Prior biopsy: No  Location: Right; Inferior  Maximum size: 1.2 cm; Other 2 dimensions: 1.1 x 0.6 cm, previously, 1.2 x 1.1 x 0.6 cm  Composition: solid/almost completely solid (2)  Echogenicity: hypoechoic (2) Change in features: Yes  Change in ACR TI-RADS risk category: Yes  *Given size (>/= 1 - 1.4 cm) and appearance, a follow-up ultrasound in 1 year should be considered based on TI-RADS criteria.  _________________________________________________________   No new nodules or suspicious features.   IMPRESSION: 1. Interval involution of the previously identified TI-RADS category 4 nodule in the right mid to upper gland. This lesion  no longer meets criteria to warrant further evaluation. 2. However, the TI-RADS category 3 nodule in the right inferior gland has become more hypoechoic in appearance and is now consistent with TI-RADS category 4 and meets criteria for imaging surveillance. Recommend follow-up ultrasound in 1 year.  In 01/2022, I suggested a  thyroid  uptake and scan but she did not go through with it, but she had this in 02/2023: Thyroid  uptake and scan (02/17/2023) - resulted 03/04/2023: Uniform uptake in mildly enlarged thyroid  gland. Pyramidal lobe potentially subtly evident.   4 hour I-123 uptake = 4.7% (normal 5-20%) 24 hour I-123 uptake = 7.9% (normal 10-30%)   IMPRESSION: 1. Uniform uptake in the thyroid  gland.  No nodularity. 2. Low normal I 123 uptake. 3. Visually the gland appears slightly enlarged and the uptake visually appears greater than the measured uptake.   Uniform scan with possible pyramidal lobe evident -consistent with Graves' disease, however, her uptake is lower than normal so for now, RAI treatment is not indicated.  Thyroid  U/S (08/01/2023): Parenchymal Echotexture: Mildly heterogeneous  Isthmus: 0.6 cm ,previously 0.5 cm  Right lobe: 6.1 x 1.7 x 2.5 cm ,previously 6.0 x 2.0 x 2.4 cm  Left lobe: 5.5 x 1.3 x 1.9 cm , previously 5.6 x 1.7 x 1.9 cm  ________________________________________________________   Estimated total number of nodules >/= 1 cm: 1  _________________________________________________________   Continued decreased size of benign-appearing nodule in the right mid thyroid  (labeled 1, 0.6 cm, previously 0.9 cm).   Nodule # 2:  Prior biopsy: No  Location: Right; Inferior  Maximum size: 1.0 cm; Other 2 dimensions: 0.8 x 0.6 cm, previously, 1.2 x 1.1 x 0.6 cm  Composition: solid/almost completely solid (2)  Echogenicity: hypoechoic (2) ACR TI-RADS risk category:  TR4 (4-6 points). *Given size (>/= 1 - 1.4 cm) and appearance, a follow-up ultrasound in 1 year should be considered based on TI-RADS criteria.  _________________________________________________________   No cervical lymphadenopathy.   IMPRESSION: Slight interval decreased size of previously visualized right inferior solid thyroid  nodule (labeled 2, 1.0 cm, previously 1.2 cm) which again meets criteria (TI-RADS category  4) for 1 year ultrasound surveillance. This study marks 1 year stability.  Pt denies: - feeling nodules in neck - hoarseness - dysphagia - choking  She denies: - excessive sweating/heat intolerance - palpitations - hyperdefecation - weight loss   She has: - Tremors-chronic  Pt does not have a FH of thyroid  ds. No FH of thyroid  cancer. No h/o radiation tx to head or neck. No recent contrast studies. No herbal supplements. No Biotin use.  Pt. also has a history of Antithrombin deficiency with history of DVT and PE, headaches. She increased the Sertraline  dose in 05/2022 >> feeling better. She has problems with strep throat, sinusitis. Saw ENT  -had repeated ABx courses. She gave birth in 2019, 2021.  ROS:  + see HPI  Past Medical History:  Diagnosis Date   Antithrombin deficiency    had a PE (2008) and DVT (1996), took coumadin for 1 year   Anxiety and depression    Depression    DVT (deep venous thrombosis) ([name])    Headache    History of cholestasis during pregnancy    Pulmonary embolism ([name])    Vaginal Pap smear, abnormal    Past Surgical History:  Procedure Laterality Date   CESAREAN SECTION     CESAREAN SECTION N/A 12/31/2019   Procedure: CESAREAN SECTION;  Surgeon: Curlene Agent, MD;  Location: [facility] ORS;  Service: Obstetrics;  Laterality: N/AMERL Moats, RNFA   CHOLECYSTECTOMY N/A 03/31/2020   Procedure: LAPAROSCOPIC CHOLECYSTECTOMY;  Surgeon: Dasie Leonor CROME, MD;  Location: [facility] OR;  Service: General;  Laterality: N/A;   ESOPHAGOGASTRODUODENOSCOPY ENDOSCOPY     EYE SURGERY Left 04/30/2020   Corneal cross linking   FIRST RIB REMOVAL     LEEP     NASAL SINUS SURGERY     Social History   Socioeconomic History   Marital status: Married    Spouse name: Not on file   Number of children: Not on file   Years of education: Not on file   Highest education level: Not on file  Occupational History   Not on file  Tobacco Use   Smoking status: Never    Smokeless tobacco: Never  Vaping Use   Vaping status: Never Used  Substance and Sexual Activity   Alcohol use: No    Alcohol/week: 0.0 standard drinks of alcohol   Drug use: No   Sexual activity: Yes    Birth control/protection: None  Other Topics Concern   Not on file  Social History Narrative   Not on file   Social Drivers of Health   Financial Resource Strain: Not on file  Food Insecurity: No Food Insecurity (01/20/2022)   Hunger Vital Sign    Worried About Running Out of Food in the Last Year: Never true    Ran Out of Food in the Last Year: Never true  Transportation Needs: Not on file  Physical Activity: Not on file  Stress: Not on file  Social Connections: Unknown (10/15/2021)   Received from [facility]   Social Network    Social Network: Not on file  Intimate Partner Violence: Unknown (09/17/2021)   Received from [facility]   HITS    Physically Hurt: Not on file    Insult or Talk Down To: Not on file    Threaten Physical Harm: Not on file    Scream or Curse: Not on file   Current Outpatient Medications on File Prior to Visit  Medication Sig Dispense Refill   busPIRone  (BUSPAR ) 5 MG tablet Take 1 tablet (5 mg total) by mouth 2 (two) times daily. (Patient taking differently: Take 5 mg by mouth at bedtime as needed.) 180 tablet 0   cetirizine (ZYRTEC) 10 MG tablet Take 10 mg by mouth daily.      fluticasone  (FLONASE ) 50 MCG/ACT nasal spray Place 1 spray into both nostrils daily. 16 g 0   levonorgestrel (MIRENA) 20 MCG/24HR IUD 1 each by Intrauterine route once.     metoprolol  succinate (TOPROL -XL) 25 MG 24 hr tablet Take 1 tablet (25 mg total) by mouth daily. (Patient not taking: Reported on 02/24/2024) 90 tablet 0   sertraline  (ZOLOFT ) 100 MG tablet Take 2 tablets (200 mg total) by mouth daily. 180 tablet 0   No current facility-administered medications on file prior to visit.   No Known Allergies Family History  Problem Relation Age of Onset   Breast cancer  Mother    Cancer Father    Breast cancer Maternal Grandmother    Thyroid  disease Neg Hx    PE: BP 118/76   Pulse 97   Ht 5' 4 (1.626 m)   Wt 169 lb 8 oz (76.9 kg)   SpO2 98%   BMI 29.09 kg/m  Wt Readings from Last 20 Encounters:  03/13/24 169 lb 8 oz (76.9 kg)  02/24/24 168 lb 12.8 oz (76.6 kg)  10/28/23 168  lb (76.2 kg)  07/25/23 168 lb 9.6 oz (76.5 kg)  07/05/23 169 lb 6.4 oz (76.8 kg)  02/08/23 168 lb (76.2 kg)  01/25/23 168 lb 6.4 oz (76.4 kg)  07/16/22 164 lb (74.4 kg)  05/20/22 168 lb (76.2 kg)  04/12/22 163 lb 8 oz (74.2 kg)  03/16/22 165 lb (74.8 kg)  01/20/22 164 lb 8 oz (74.6 kg)  01/15/22 164 lb 12.8 oz (74.8 kg)  01/14/22 163 lb 9.6 oz (74.2 kg)  08/26/21 159 lb (72.1 kg)  05/15/21 153 lb 9.6 oz (69.7 kg)  02/24/21 152 lb 6.4 oz (69.1 kg)  10/20/20 153 lb (69.4 kg)  08/19/20 150 lb 6.4 oz (68.2 kg)  07/02/20 151 lb (68.5 kg)   Constitutional: Slightly overweight, in NAD Eyes:  EOMI, no exophthalmos ENT: no neck masses, + mild R thyroid  fulness, no cervical lymphadenopathy Cardiovascular: tachycardia, RR, No MRG Respiratory: CTA B Musculoskeletal: no deformities Skin:no rashes Neurological: + fine tremor with outstretched hands  ASSESSMENT: 1.  Subclinical thyrotoxicosis  2.  Thyroid  nodules  PLAN:  1. Patient with history of mildly low TSH and normal free thyroid  hormones, without thyrotoxic symptoms other than tremors.  No weight loss, heat intolerance, palpitations, anxiety, hyperdefecation. -She did not have exogenous causes for her low TSH. -We discussed about possible causes for thyrotoxicosis: Pregnancy related Postpartum thyroiditis: She gave birth few months prior to the tests becoming abnormal.  However, TSH persisted in being low afterwards Graves' disease: Her TSI antibodies were not elevated but our working diagnosis is still mild Graves' disease.  We were able to obtain a thyroid  uptake and scan and this showed an uniform scan with low  normal uptake but a pyramidal lobe was evident, which we discussed is usually seen in Graves' disease. Toxic multinodular goiter/toxic adenoma: She did have 2 thyroid  nodules which we are following, however, on the thyroid  uptake and scan, they did not appear to be hot -She appears to have mild Graves' disease which is resolving -her latest TSH was normal -She did not require treatment but we did discuss about possible treatment for Graves' disease to include methimazole, RAI treatment, or surgery as a last resort -At today's visit, she still has fine tremor in the right hand and still complains of anxiety.  None of these are new.  She did run out of her Toprol -XL and I refilled that today. - We will repeat her TFTs today and see if she needs one of the above treatments - RTC in 1 year, but at 6 months for labs  2.  Thyroid  nodules - Neck compression symptoms or masses felt on palpation of her neck today -She has 2 small thyroid  nodules which were initially evaluated by ultrasound in 11/2020.  A right thyroid  nodule met criteria for 1 year follow-up.  She had a right inferior nodule for which no follow-up was needed.  We repeated her thyroid  ultrasound 12/2021 and this showed the right superior/mid thyroid  nodule involuted and did not measure more than 0.9 cm, without the need for follow-up.  However, the right inferior nodule appeared to be hypoechoic, while previously was isoechoic.  Annual thyroid  ultrasound was checked in 07/2023 and this showed that the right mid thyroid  nodule involuted to 0.6 cm, but the right inferior thyroid  nodule still meet criteria for 1 year follow-up. - Plan to repeat an ultrasound next year  Orders Placed This Encounter  Procedures   TSH   T4, free   T3, free   Requested  Prescriptions   Signed Prescriptions Disp Refills   metoprolol  succinate (TOPROL -XL) 25 MG 24 hr tablet 90 tablet 1    Sig: Take 1 tablet (25 mg total) by mouth daily.   Lela Fendt, MD  PhD [facility] Endocrinology

## 2024-03-14 ENCOUNTER — Ambulatory Visit: Payer: Self-pay | Admitting: Internal Medicine

## 2024-04-16 ENCOUNTER — Ambulatory Visit
Admission: RE | Admit: 2024-04-16 | Discharge: 2024-04-16 | Disposition: A | Source: Ambulatory Visit | Attending: Hematology and Oncology

## 2024-04-16 DIAGNOSIS — Z9189 Other specified personal risk factors, not elsewhere classified: Secondary | ICD-10-CM

## 2024-05-07 ENCOUNTER — Other Ambulatory Visit: Payer: Self-pay

## 2024-05-07 MED ORDER — SERTRALINE HCL 100 MG PO TABS: 200.0000 mg | ORAL_TABLET | Freq: Every day | ORAL | 0 refills | Status: DC

## 2024-05-26 ENCOUNTER — Ambulatory Visit
Admission: RE | Admit: 2024-05-26 | Discharge: 2024-05-26 | Disposition: A | Discharge status: Home / Self Care | Source: Ambulatory Visit | Attending: Family Medicine

## 2024-05-26 ENCOUNTER — Other Ambulatory Visit: Payer: Self-pay

## 2024-05-26 VITALS — BP 139/87 | HR 96 | Temp 97.7°F | Resp 17

## 2024-05-26 DIAGNOSIS — J321 Chronic frontal sinusitis: Secondary | ICD-10-CM

## 2024-05-26 DIAGNOSIS — J029 Acute pharyngitis, unspecified: Secondary | ICD-10-CM

## 2024-05-26 LAB — POCT RAPID STREP A (OFFICE): Rapid Strep A Screen: NEGATIVE

## 2024-05-26 MED ORDER — DOXYCYCLINE HYCLATE 100 MG PO CAPS: 100.0000 mg | ORAL_CAPSULE | Freq: Two times a day (BID) | ORAL | 0 refills | Status: AC

## 2024-05-26 NOTE — ED Provider Notes (Signed)
 [facility]    CSN: [id number] Arrival date & time: 05/26/24  1101      History   Chief Complaint Chief Complaint  Patient presents with   Sore Throat    Entered by patient    HPI Sherry Ray is a 43 y.o. female  presents for evaluation of URI symptoms for 3 days. Patient reports associated symptoms of sore throat with cough and nasal/sinus congestion x 2 months. Denies N/V/D, fevers, ear pain, body ache, shortness of breath. Patient does not have a hx of asthma. Patient is not an active smoker.  Patient was seen at [facility] November 13 for her sinus congestion.  She was given Augmentin  which she states helped a little but did not resolve her symptoms.  She feels her symptoms are worsening now.  Pt has taken Claritin, Sudafed, and Flonase  OTC for symptoms.  She has seen ENT in the past and reports history of deviated septum.  Pt has no other concerns at this time.    Sore Throat    Past Medical History:  Diagnosis Date   Antithrombin deficiency    had a PE (2008) and DVT (1996), took coumadin for 1 year   Anxiety and depression    Depression    DVT (deep venous thrombosis) ([name])    Headache    History of cholestasis during pregnancy    Pulmonary embolism ([name])    Vaginal Pap smear, abnormal     Patient Active Problem List   Diagnosis Date Noted   At high risk for breast cancer 02/08/2023   Over weight 04/12/2022   Thyroid  nodule 08/26/2021   Hyperthyroidism 08/19/2020   History of cesarean section 12/31/2019   Term pregnancy 12/31/2019   Upper respiratory tract infection 07/05/2019   Antithrombin deficiency 07/14/2016   Depression with anxiety 12/17/2015    Past Surgical History:  Procedure Laterality Date   CESAREAN SECTION     CESAREAN SECTION N/A 12/31/2019   Procedure: CESAREAN SECTION;  Surgeon: Curlene Agent, MD;  Location: [facility];  Service: Obstetrics;  Laterality: N/AMERL Moats, RNFA   CHOLECYSTECTOMY N/A 03/31/2020    Procedure: LAPAROSCOPIC CHOLECYSTECTOMY;  Surgeon: Dasie Leonor CROME, MD;  Location: [facility] OR;  Service: General;  Laterality: N/A;   ESOPHAGOGASTRODUODENOSCOPY ENDOSCOPY     EYE SURGERY Left 04/30/2020   Corneal cross linking   FIRST RIB REMOVAL     LEEP     NASAL SINUS SURGERY      OB History     Gravida  2   Para  2   Term  2   Preterm      AB      Living  2      SAB      IAB      Ectopic      Multiple  0   Live Births  1            Home Medications    Prior to Admission medications  Medication Sig Start Date End Date Taking? Authorizing Provider  doxycycline  (VIBRAMYCIN ) 100 MG capsule Take 1 capsule (100 mg total) by mouth 2 (two) times daily for 7 days. 05/26/24 06/02/24 Yes Kamariya Blevens, Jodi R, NP  busPIRone  (BUSPAR ) 5 MG tablet Take 1 tablet (5 mg total) by mouth 2 (two) times daily. Patient taking differently: Take 5 mg by mouth at bedtime as needed. 12/26/23   Jason Leita Repine, FNP  cetirizine (ZYRTEC) 10 MG tablet Take 10 mg  by mouth daily.     [provider]  fluticasone  (FLONASE ) 50 MCG/ACT nasal spray Place 1 spray into both nostrils daily. 01/03/23   Raspet, Erin K, PA-C  levonorgestrel (MIRENA) 20 MCG/24HR IUD 1 each by Intrauterine route once.    [provider]  metoprolol  succinate (TOPROL -XL) 25 MG 24 hr tablet Take 1 tablet (25 mg total) by mouth daily. 03/13/24   Trixie File, MD  sertraline  (ZOLOFT ) 100 MG tablet Take 2 tablets (200 mg total) by mouth daily. 05/07/24   Douglass Kenney NOVAK, FNP    Family History Family History  Problem Relation Age of Onset   Breast cancer Mother    Cancer Father    Breast cancer Maternal Grandmother    Thyroid  disease Neg Hx     Social History Social History[1]   Allergies   Patient has no known allergies.   Review of Systems Review of Systems  Constitutional:  Negative for fever.  HENT:  Positive for congestion, sinus pressure, sinus pain and sore throat.   Respiratory:   Positive for cough.      Physical Exam Triage Vital Signs ED Triage Vitals  Encounter Vitals Group     BP 05/26/24 1121 139/87     Girls Systolic BP Percentile --      Girls Diastolic BP Percentile --      Boys Systolic BP Percentile --      Boys Diastolic BP Percentile --      Pulse Rate 05/26/24 1121 96     Resp 05/26/24 1121 17     Temp 05/26/24 1121 97.7 F (36.5 C)     Temp Source 05/26/24 1121 Oral     SpO2 05/26/24 1121 95 %     Weight --      Height --      Head Circumference --      Peak Flow --      Pain Score 05/26/24 1119 2     Pain Loc --      Pain Education --      Exclude from Growth Chart --    No data found.  Updated Vital Signs BP 139/87   Pulse 96   Temp 97.7 F (36.5 C) (Oral)   Resp 17   SpO2 95%   Visual Acuity Right Eye Distance:   Left Eye Distance:   Bilateral Distance:    Right Eye Near:   Left Eye Near:    Bilateral Near:     Physical Exam Vitals and nursing note reviewed.  Constitutional:      General: She is not in acute distress.    Appearance: She is well-developed. She is not ill-appearing.  HENT:     Head: Normocephalic and atraumatic.     Right Ear: Tympanic membrane and ear canal normal.     Left Ear: Tympanic membrane and ear canal normal.     Nose: Congestion present.     Right Turbinates: Pale.     Left Turbinates: Pale.     Right Sinus: Frontal sinus tenderness present. No maxillary sinus tenderness.     Left Sinus: Frontal sinus tenderness present. No maxillary sinus tenderness.     Mouth/Throat:     Mouth: Mucous membranes are moist.     Pharynx: Oropharynx is clear. Uvula midline. Posterior oropharyngeal erythema present.     Tonsils: No tonsillar exudate or tonsillar abscesses.  Eyes:     Conjunctiva/sclera: Conjunctivae normal.     Pupils: Pupils are equal, round,  and reactive to light.  Cardiovascular:     Rate and Rhythm: Normal rate and regular rhythm.     Heart sounds: Normal heart sounds.   Pulmonary:     Effort: Pulmonary effort is normal.     Breath sounds: Normal breath sounds. No wheezing, rhonchi or rales.  Musculoskeletal:     Cervical back: Normal range of motion and neck supple.  Lymphadenopathy:     Cervical: No cervical adenopathy.  Skin:    General: Skin is warm and dry.  Neurological:     General: No focal deficit present.     Mental Status: She is alert and oriented to person, place, and time.  Psychiatric:        Mood and Affect: Mood normal.        Behavior: Behavior normal.      UC Treatments / Results  Labs (all labs ordered are listed, but only abnormal results are displayed) Labs Reviewed  CULTURE, GROUP A STREP [facility])  POCT RAPID STREP A (OFFICE)    EKG   Radiology No results found.  Procedures Procedures (including critical care time)  Medications Ordered in UC Medications - No data to display  Initial Impression / Assessment and Plan / UC Course  I have reviewed the triage vital signs and the nursing notes.  Pertinent labs & imaging results that were available during my care of the patient were reviewed by me and considered in my medical decision making (see chart for details).     I reviewed exam and symptoms with patient.  Negative rapid strep we will send strep throat culture.  Patient with persistent sinusitis that has worsened recently after treatment 1 month ago with Augmentin .  Will do doxycycline  but I have highly encouraged her to follow-up with her ear nose and throat given her persistent symptoms and she verbalized understanding.  Continue Flonase  and allergy medicine.  PCP follow-up 2 to 3 days for recheck.  ER precautions reviewed. Final Clinical Impressions(s) / UC Diagnoses   Final diagnoses:  Sore throat  Viral pharyngitis  Frontal sinusitis, unspecified chronicity     Discharge Instructions      Your strep throat swab was negative.  The clinic will send it for a culture and contact you if this is  positive, typically takes 2 days.  Given your persistent sinus symptoms we will do doxycycline  twice daily for 7 days but I highly recommend you follow-up with your ear nose and throat provider for further evaluation of your persistent symptoms.  Continue your Flonase  and allergy medicine as previously prescribed.  Please follow-up with your PCP in 2 to 3 days for recheck.  Please go to the emergency room for any worsening symptoms.  I hope you feel better soon!     ED Prescriptions     Medication Sig Dispense Auth. Provider   doxycycline  (VIBRAMYCIN ) 100 MG capsule Take 1 capsule (100 mg total) by mouth 2 (two) times daily for 7 days. 14 capsule [place] Eppard, Jodi R, NP      PDMP not reviewed this encounter.     [1]  Social History Tobacco Use   Smoking status: Never   Smokeless tobacco: Never  Vaping Use   Vaping status: Never Used  Substance Use Topics   Alcohol use: No    Alcohol/week: 0.0 standard drinks of alcohol   Drug use: No     Loreda Myla SAUNDERS, NP 05/26/24 1213

## 2024-05-26 NOTE — ED Triage Notes (Signed)
 Pt c/o sore throatx3d. Pt c/o nasal [id number]

## 2024-05-26 NOTE — Discharge Instructions (Addendum)
 Your strep throat swab was negative.  The clinic will send it for a culture and contact you if this is positive, typically takes 2 days.  Given your persistent sinus symptoms we will do doxycycline  twice daily for 7 days but I highly recommend you follow-up with your ear nose and throat provider for further evaluation of your persistent symptoms.  Continue your Flonase  and allergy medicine as previously prescribed.  Please follow-up with your PCP in 2 to 3 days for recheck.  Please go to the emergency room for any worsening symptoms.  I hope you feel better soon!

## 2024-05-27 NOTE — Progress Notes (Unsigned)
 Psychiatric Initial Adult Assessment  Patient Identification: Sherry Ray MRN:  [id number] Date of Evaluation:  05/30/2024 Referral Source:Murray, Leita Repine, FNP  Medical student, Karl Punch, was present and completed significant portions of the assessment and plan.  Attestation below.  Assessment:  Sherry Ray is a 43 y.o. female with a history of depression and anxiety who presents to [facility] for initial evaluation of medication management for depression and anxiety.  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  Patient reports low mood, low energy, low concentration, feelings of hopelessness, excessive worry, inability to stop worry, feelings of tension and irritability. This occurs in the setting of optimized Zoloft  at 200 mg every day with significant psychosocial stressors including family illnesses, work stress, and moving homes.  The patient's presentation is most consistent with a principal diagnosis of recurrent MDD vs persistent depressive disorder and generalized anxiety disorder. Patient has a confirmed history of MDD and GAD. Persistent depressive disorder can also be considered as today she does not report 5 specific symptoms of depression, but does endorse longstanding depressed mood. Current dose of Zoloft  200 mg has not been effective in controlling symptoms and patient would like to modify regimen. Will try cross-taper with Lexapro  as its different receptor profile may have a positive effect for her. Avoiding SNRIs as she had a poor experience previously. Can consider adding adjunct Wellbutrin in the future, but avoiding for now given her history of migraines. Will also consider sleep study in the future as patient reports history of snoring. Will add low dose hydroxyzine  as needed for anxiety. Recommend that patient continue with  psychotherapy and increase sessions to more than once per month to help with coping strategies.   Risk Assessment: A suicide and violence risk assessment was performed as part of this evaluation. There patient is deemed to be at chronic elevated risk for self-harm/suicide given the following factors: history of depression. These risk factors are mitigated by the following factors: lack of active SI/HI, no known access to weapons or firearms, no history of previous suicide attempts, no history of violence, motivation for treatment, utilization of positive coping skills, safe housing, and presence of a safety plan with follow-up care. The patient is deemed to be at chronic elevated risk for violence given the following factors: NONE. These risk factors are mitigated by the following factors: no known history of violence towards others, no known violence towards others in the last 6 months, no known history of threats of harm towards others, no known homicidal ideation in the last 6 months, no command hallucinations to harm others in the last 6 months, no active symptoms of psychosis, no active symptoms of mania, low impulsivity, intolerant attitude toward deviance, high intellectual functioning, and connectedness to family. There is no  acute risk for suicide or violence at this time. The patient was educated about relevant modifiable risk factors including following recommendations for treatment of psychiatric illness and abstaining from substance abuse.  While future psychiatric events cannot be accurately predicted, the patient does not currently require  acute inpatient psychiatric care and does not currently meet [place]  involuntary commitment criteria.    Plan:  # MDD, recurrent, moderate, without psychosis # GAD Past medication trials: Effexor (stopped due to AEs), Prozac  (unclear if ineffective),  Status of problem: new to me Interventions: -- Start Lexapro  5 mg daily for 1 week, then  increase to 10 mg daily until follow-up -- Decrease Zoloft  200 mg to 100 mg nightly for 1 week, then decrease to 50 mg for 1 week, then stop -- Start hydroxyzine  10 mg 3 times daily as needed for acute anxiety  #Sleeping difficulties # R/o OSA Past medication trials:  Status of problem: new to me Interventions: -- Continue to assess -- Recommend sleep study at next visit  Health Maintenance PCP: Jason Leita Repine, FNP (Inactive) @ [facility]  Hyperthyroidism/Thyroid  Nodules- HTN - metoprolol  IUD Migraine w/ aura -ibuprofen  as needed Hx of DVTs (1996) Hx of PE (2008)  Patient was given contact information for behavioral health clinic and was instructed to call 911 for emergencies.  Patient and plan of care will be discussed with the Attending MD who agrees with the above statement and plan.   Subjective:  Chief Complaint:  Chief Complaint  Patient presents with   Establish Care    History of Present Illness:   The patient is here for depression, anxiety, medication management. The patient reports a psychiatric history of depression and anxiety. She is currenty taking Zoloft  200 mg. Patient has a past medical history of HTN, antithrombin deficiency with previous DVT, and potpartum hyperthyroidism.  Patient presents today after referral from PCP with concerns that depression and anxiety are not controlled despite optimized Zoloft  dosing. She reports significant life stressors that have caused symptoms to persist. She states that her husband has been diagnosed with Parkinsons disease and she has had difficulty with adapting to a new chronic illness. She also has had to move homes after an argument with a neighbor. She reports increased stress from work due to a recent increase in complexity of work. Patient also states that she has two sons, one of which has been diagnosed with ADHD and management of diagnosis and sons actions have felt more difficult  recently.  Patient reports history of depression and anxiety since she was a teenager. She has previously trialed Prozac  which was ineffective and Effexor which caused adverse effects. She has been on Zoloft  for 5 years and feels as though it helps with symptoms, but is not effective enough. She recently trialed Buspar , but was not consistent with use and did not find it effective for symptom control.  She reports symptoms of depression including low energy, low concentration, and feelings of hopelessness due to her multiple stressors. She states that her sleep is mildly disturbed, reporting 7.5 hours per night that is often interrupted. She states this may be due to OSA, although she has not been tested. She denies anhedonia and psychomotor slowing.  Anxiety symptoms include frequent worry and inability to stop worries. She states that she will become tense and jittery while anxious. She denies panic attacks.  Patient denies SI,HI, AVH, paranoias, delusions, and symptoms of mania.   Psychiatric ROS Depression: The patient reports a long history of low mood characterized by low energy, low concentration, and feelings of hopelessness. Suicidal thoughts are denied. The patient denies any symptoms of anhedonia and appetite changes.  Anxiety: The patient reports a long history of excessive anxiety and worry about a number of events, characterized by tension and psychomotor agitation.  Panic attacks: The patient denies any current or past history of panic attacks  Mania/Hypomania: Negative for any past or current symptoms of expansive energy or mood  PTSD: Negative for any history of trauma  Eating Disorder: The patient denies any current or past history of disordered eating that includes restrictive, binging,  purging behaviors, or any other compensatory behaviors  IED: Denies  Psychosis: The patient denies any history of hallucinations, disorganized thinking, paranoia, or  delusions.   Safety: Active SI: Denied Passive SI: Denies Access to firearms: Denies  Review of Systems  All other systems reviewed and are negative.    Past Psychiatric History: Diagnoses: MDD, GAD Medication trials: Prozac  - ineffective, Effexor - adverse effects including brain zaps Previous psychiatrist/therapist: Currently seeing Eleanor Nose for therapy once per month Hospitalizations: No Suicide attempts: No NSSIB Hx: No Hx of violence towards others: No Hx of trauma/abuse: No  Substance Abuse History: Alcohol: Current occasional alcohol use Hx of withdrawal seizures/Dts: Denies Tobacco: none Cannabis: Takes THC gummies twice per week. 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 Marital Status: Married to husband Children: 2 sons, 6 and 4  Past Medical History:  Past Medical History:  Diagnosis Date   Antithrombin deficiency    had a PE (2008) and DVT (1996), took coumadin for 1 year   Anxiety and depression    Depression    DVT (deep venous thrombosis) ([name])    Headache    History of cholestasis during pregnancy    Pulmonary embolism ([name])    Vaginal Pap smear, abnormal     Past Surgical History:  Procedure Laterality Date   CESAREAN SECTION     CESAREAN SECTION N/A 12/31/2019   Procedure: CESAREAN  SECTION;  Surgeon: Curlene Agent, MD;  Location: [facility] ORS;  Service: Obstetrics;  Laterality: N/AMERL Moats, RNFA   CHOLECYSTECTOMY N/A 03/31/2020   Procedure: LAPAROSCOPIC CHOLECYSTECTOMY;  Surgeon: Dasie Leonor CROME, MD;  Location: [facility] OR;  Service: General;  Laterality: N/A;   ESOPHAGOGASTRODUODENOSCOPY ENDOSCOPY     EYE SURGERY Left 04/30/2020   Corneal cross linking   FIRST RIB REMOVAL     LEEP     NASAL SINUS SURGERY      Family History:  Family History  Problem Relation Age of Onset   Breast cancer Mother    Cancer Father    Breast cancer Maternal Grandmother    Thyroid  disease Neg Hx     Social History:   Social History   Socioeconomic History   Marital status: Married    Spouse name: Not on file   Number of children: Not on file   Years of education: Not on file   Highest education level: Not on file  Occupational History   Not on file  Tobacco Use   Smoking status: Never   Smokeless tobacco: Never  Vaping Use   Vaping status: Never Used  Substance and Sexual Activity   Alcohol use: No    Alcohol/week: 0.0 standard drinks of alcohol   Drug use: No   Sexual activity: Yes    Birth control/protection: None  Other Topics Concern   Not on file  Social History Narrative   Not on file   Social Drivers of Health   Tobacco Use: Low Risk (05/26/2024)  Patient History    Smoking Tobacco Use: Never    Smokeless Tobacco Use: Never    Passive Exposure: Not on file  Financial Resource Strain: Not on file  Food Insecurity: No Food Insecurity (01/20/2022)   Hunger Vital Sign    Worried About Running Out of Food in the Last Year: Never true    Ran Out of Food in the Last Year: Never true  Transportation Needs: Not on file  Physical Activity: Not on file  Stress: Not on file  Social Connections: Unknown (10/15/2021)   Received from [facility]   Social Network    Social Network: Not on file  Depression (PHQ2-9): High Risk (05/28/2024)   Depression (PHQ2-9)     PHQ-2 Score: 13  Alcohol Screen: Not on file  Housing: Not on file  Utilities: Not on file  Health Literacy: Not on file    Allergies:  Allergies[1]  Current Medications: Current Outpatient Medications  Medication Sig Dispense Refill   escitalopram  (LEXAPRO ) 10 MG tablet Take 1 tablet (10 mg total) by mouth daily. 60 tablet 0   hydrOXYzine  (ATARAX ) 10 MG tablet Take 1 tablet (10 mg total) by mouth 3 (three) times daily as needed for anxiety. 90 tablet 2   busPIRone  (BUSPAR ) 5 MG tablet Take 1 tablet (5 mg total) by mouth 2 (two) times daily. (Patient taking differently: Take 5 mg by mouth at bedtime as needed.) 180 tablet 0   cetirizine (ZYRTEC) 10 MG tablet Take 10 mg by mouth daily.      doxycycline  (VIBRAMYCIN ) 100 MG capsule Take 1 capsule (100 mg total) by mouth 2 (two) times daily for 7 days. 14 capsule 0   escitalopram  (LEXAPRO ) 5 MG tablet Take 1 tablet (5 mg total) by mouth daily. 7 tablet 0   fluticasone  (FLONASE ) 50 MCG/ACT nasal spray Place 1 spray into both nostrils daily. 16 g 0   levonorgestrel (MIRENA) 20 MCG/24HR IUD 1 each by Intrauterine route once.     metoprolol  succinate (TOPROL -XL) 25 MG 24 hr tablet Take 1 tablet (25 mg total) by mouth daily. 90 tablet 1   No current facility-administered medications for this visit.     Objective: Psychiatric Specialty Exam: General Appearance: Casual, fairly groomed, appears stated age  Eye Contact:  Good    Speech:  Clear, coherent, normal rate, spontaneous  Volume:  Normal   Mood:  see above  Affect:  Appropriate, congruent, full range  Thought Content: Logical, denies HI, AVH, paranoia's, delusions  Suicidal Thoughts: see subjective  Thought Process:  Coherent, goal-directed  Orientation:  A&Ox4   Memory:  Immediate good  Judgment: Good  Insight: Good  Concentration:  Attention and concentration good   Recall:  Good  Fund of Knowledge: Good  Language: Good, fluent  Psychomotor Activity: Increased, some  restlessness noted with hands and feet  Akathisia:  NA   AIMS (if indicated): NA   Assets:   Communication Skills Desire for Improvement Social Support  ADL's:  Intact  Cognition: WNL  Sleep: see above  Appetite: see above    Physical Exam Vitals reviewed.  Constitutional:      General: She is not in acute distress.    Appearance: She is not ill-appearing.  HENT:     Head: Normocephalic and atraumatic.  Eyes:     Extraocular Movements: Extraocular movements intact.     Conjunctiva/sclera: Conjunctivae normal.  Pulmonary:     Effort: Pulmonary effort is normal. No respiratory distress.  Neurological:  General: No focal deficit present.     Mental Status: Mental status is at baseline.       Metabolic Disorder Labs: No results found for: HGBA1C, MPG No results found for: PROLACTIN Lab Results  Component Value Date   CHOL 226 (H) 06/06/2018   TRIG 287 (H) 06/06/2018   HDL 44 06/06/2018   CHOLHDL 5.1 (H) 06/06/2018   LDLCALC 125 (H) 06/06/2018   Lab Results  Component Value Date   TSH 0.47 03/13/2024    Therapeutic Level Labs: No results found for: LITHIUM No results found for: CBMZ No results found for: VALPROATE   Luie Siegel MS3  I personally was present and performed or re-performed the history, physical exam and medical decision-making activities of this service and have verified that the service and findings are accurately documented in the students note.    Marlo Masson, MD 12/17/20258:20 AM      [1] No Known Allergies

## 2024-05-28 ENCOUNTER — Ambulatory Visit (HOSPITAL_COMMUNITY): Payer: Self-pay | Admitting: Student in an Organized Health Care Education/Training Program

## 2024-05-28 VITALS — BP 142/88 | HR 87 | Ht 64.0 in | Wt 175.4 lb

## 2024-05-28 DIAGNOSIS — F411 Generalized anxiety disorder: Secondary | ICD-10-CM

## 2024-05-28 DIAGNOSIS — F331 Major depressive disorder, recurrent, moderate: Secondary | ICD-10-CM

## 2024-05-28 MED ORDER — ESCITALOPRAM OXALATE 5 MG PO TABS: 5.0000 mg | ORAL_TABLET | Freq: Every day | ORAL | 0 refills | Status: DC

## 2024-05-28 MED ORDER — HYDROXYZINE HCL 10 MG PO TABS: 10.0000 mg | ORAL_TABLET | Freq: Three times a day (TID) | ORAL | 2 refills | Status: AC | PRN

## 2024-05-28 MED ORDER — ESCITALOPRAM OXALATE 10 MG PO TABS: 10.0000 mg | ORAL_TABLET | Freq: Every day | ORAL | 0 refills | Status: DC

## 2024-05-28 NOTE — Patient Instructions (Addendum)
 [facility] Patient Name: Sherry Ray Date of Visit: [05/28/2024  Provider Name: Dr. Homer   Dear [name] [name] [name], it was a pleasure seeing you today, and we appreciate the opportunity to care for you.   These are the changes we have made to your medications:  Start lexapro  5 mg daily for 1 week, then increase to 10 mg daily  Decrease zoloft  200 mg to 100 mg nightly for 1 week, then decrease to 50 mg for 1 week then stop    Lifestyle Recommendations Sleep Hygiene: Aim for 7-9 hours of quality sleep each night. Establish a regular sleep routine and create a restful environment. Stress Management: Practice stress-reducing techniques such as mindfulness, meditation, deep breathing exercises, or hobbies that you enjoy. Smoking Cessation: If you smoke, consider quitting. We can provide resources and support to help you. Alcohol Consumption: Limit alcohol intake to moderate levels--up to one drink per day for women and up to two drinks per day for men. Routine Health Screenings: Stay up-to-date with routine health screenings and vaccinations as recommended.

## 2024-05-29 ENCOUNTER — Ambulatory Visit (HOSPITAL_COMMUNITY): Payer: Self-pay

## 2024-05-29 LAB — CULTURE, GROUP A STREP (THRC)

## 2024-06-25 ENCOUNTER — Encounter (HOSPITAL_COMMUNITY): Payer: Self-pay | Admitting: Student in an Organized Health Care Education/Training Program

## 2024-06-25 ENCOUNTER — Ambulatory Visit (HOSPITAL_COMMUNITY): Admitting: Student in an Organized Health Care Education/Training Program

## 2024-06-25 VITALS — BP 131/77 | HR 83 | Ht 64.0 in | Wt 173.4 lb

## 2024-06-25 DIAGNOSIS — T43225A Adverse effect of selective serotonin reuptake inhibitors, initial encounter: Secondary | ICD-10-CM

## 2024-06-25 DIAGNOSIS — R0683 Snoring: Secondary | ICD-10-CM

## 2024-06-25 DIAGNOSIS — E663 Overweight: Secondary | ICD-10-CM | POA: Diagnosis not present

## 2024-06-25 DIAGNOSIS — F331 Major depressive disorder, recurrent, moderate: Secondary | ICD-10-CM | POA: Diagnosis not present

## 2024-06-25 DIAGNOSIS — F411 Generalized anxiety disorder: Secondary | ICD-10-CM | POA: Diagnosis not present

## 2024-06-25 MED ORDER — ESCITALOPRAM OXALATE 10 MG PO TABS: 10.0000 mg | ORAL_TABLET | Freq: Every day | ORAL | 0 refills | Status: DC

## 2024-06-25 MED ORDER — FLUOXETINE HCL 10 MG PO CAPS: 10.0000 mg | ORAL_CAPSULE | Freq: Every day | ORAL | 0 refills | Status: DC

## 2024-06-25 NOTE — Progress Notes (Unsigned)
 BH MD Outpatient Progress Note  06/25/2024 2:06 PM Sherry Ray  MRN:  [id number]  Assessment:  Sherry Ray presents for follow-up evaluation on 06/25/2024 .   The patient has the working diagnoses of ***  Chart Review Recent encounters since last visit: *** Recent Labs/Imaging since last visit: ***  Identifying Information: Sherry Ray is a 44 y.o. female with a history of *** who is an established patient with [facility] for medication management. Initial evaluation by this provider completed on ***. For a comprehensive history and detailed assessment, please refer to the initial adult assessment.  The patient's PMHx is significant for ***.   Plan:  # MDD, recurrent, moderate, without psychosis # GAD Past medication trials: Effexor (stopped due to AEs), Prozac  (unclear if ineffective),  Status of problem: new to me Interventions: -- Start Lexapro  5 mg daily for 1 week, then increase to 10 mg daily until follow-up -- Decrease Zoloft  200 mg to 100 mg nightly for 1 week, then decrease to 50 mg for 1 week, then stop -- Start hydroxyzine  10 mg 3 times daily as needed for acute anxiety   #Sleeping difficulties # R/o OSA Past medication trials:  Status of problem: new to me Interventions: -- Continue to assess -- Recommend sleep study at next visit   Health Maintenance PCP: Jason Leita Repine, FNP (Inactive) @ [facility]  Hyperthyroidism/Thyroid  Nodules- HTN - metoprolol  IUD Migraine w/ aura -ibuprofen  as needed Hx of DVTs (1996) Hx of PE (2008)  Patient was given contact information for behavioral health clinic and was instructed to call 911 for emergencies.   Subjective:  Chief Complaint:  Chief Complaint  Patient presents with   Follow-up    Interval History:  Patient reports having a hard time with the medication transition, stating that decreasing sertraline  was difficult and led to malaise and brain zaps.  She describes that she stopped taking Zoloft  approximately 1.5 weeks ago. Patient reports her emotions feel flattened with increased irritability, and she states that her depression is worse compared to the prior visit with very little motivation. Patient reports sleep of 7 to 8 hours per night but states she does not feel rested. She describes challenges with medication adherence in the context of the recent taper and discontinuation. Patient reports she denies alcohol, tobacco, and cannabis use. She states she denies suicidal ideation, homicidal ideation, and auditory or visual hallucinations.  Visit Diagnosis:    ICD-10-CM   1. Moderate episode of recurrent major depressive disorder ([name])  F33.1 escitalopram  (LEXAPRO ) 10 MG tablet    2. GAD (generalized anxiety disorder)  F41.1 escitalopram  (LEXAPRO ) 10 MG tablet    3. Selective serotonin reuptake inhibitor (SSRI) discontinuation syndrome  T43.225A FLUoxetine  (PROZAC ) 10 MG capsule    4. Overweight (BMI 25.0-29.9)  E66.3 Ambulatory referral to Neurology    5. Snoring  R06.83 Ambulatory referral to Neurology     Past Psychiatric History: Diagnoses: MDD, GAD Medication trials: Prozac  - ineffective, Effexor - adverse effects including brain zaps Previous psychiatrist/therapist: Currently seeing Eleanor Nose for therapy once per month Hospitalizations: No Suicide attempts: No NSSIB Hx: No Hx of violence towards others: No Hx of trauma/abuse: No   Substance Abuse History: Alcohol: Current occasional alcohol use Hx of withdrawal seizures/Dts: Denies Tobacco: none Cannabis: Takes THC gummies twice per week. 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 Marital Status: Married to husband Children: 2 sons, 6 and 4    Social History   Socioeconomic History   Marital status: Married    Spouse name: Not on file   Number of children: Not on file   Years of education: Not on file   Highest education level: Not on file  Occupational History   Not on file  Tobacco Use   Smoking status: Never   Smokeless tobacco: Never  Vaping Use   Vaping status: Never Used  Substance and Sexual Activity   Alcohol use: No    Alcohol/week: 0.0 standard drinks of alcohol   Drug use: No   Sexual activity: Yes    Birth control/protection: None  Other Topics Concern   Not on file  Social History Narrative   Not on file   Social Drivers of Health   Tobacco Use: Low Risk (06/25/2024)   Patient History    Smoking Tobacco Use: Never    Smokeless Tobacco Use: Never    Passive Exposure: Not on file  Financial Resource Strain: Not on file  Food Insecurity: Low Risk (06/20/2024)   Received from [facility]   Epic    Within the past 12 months, you worried that your food would run out before you got money to buy more: Never true    Within the past 12 months, the food you bought just didn't last and you didn't have money to get more. : Never true  Transportation Needs: No Transportation Needs (06/20/2024)   Received from [facility]    In the past 12 months, has lack of reliable transportation kept you from medical appointments, meetings, work or from getting things needed for daily living? : No  Physical Activity: Not on file  Stress: Not on file  Social Connections:  Unknown (10/15/2021)   Received from [facility]   Social Network    Social Network: Not on file  Depression (PHQ2-9): High Risk (05/28/2024)   Depression (PHQ2-9)    PHQ-2 Score: 13  Alcohol Screen: Not on file  Housing: Low Risk (06/20/2024)   Received from [facility]   Epic    What is your living situation today?: I have a steady place to live    Think about the place you live. Do you have problems with any of the following? Choose all that apply:: None/None on this list  Utilities: Low Risk (06/20/2024)   Received from [facility]   Utilities    In the past 12 months has the electric, gas, oil, or water  company threatened to shut off services in your home? : No  Health Literacy: Not on file    Allergies: Allergies[1]  Current Medications: Current Outpatient Medications  Medication Sig Dispense Refill   busPIRone  (BUSPAR ) 5 MG tablet Take 1 tablet (5 mg total) by mouth 2 (two) times daily. (Patient taking differently: Take 5 mg by mouth at bedtime as needed.) 180 tablet 0   cetirizine (ZYRTEC) 10 MG tablet Take 10 mg by mouth daily.  escitalopram  (LEXAPRO ) 10 MG tablet Take 1 tablet (10 mg total) by mouth daily. 60 tablet 0   escitalopram  (LEXAPRO ) 5 MG tablet Take 1 tablet (5 mg total) by mouth daily. 7 tablet 0   fluticasone  (FLONASE ) 50 MCG/ACT nasal spray Place 1 spray into both nostrils daily. 16 g 0   hydrOXYzine  (ATARAX ) 10 MG tablet Take 1 tablet (10 mg total) by mouth 3 (three) times daily as needed for anxiety. 90 tablet 2   levonorgestrel (MIRENA) 20 MCG/24HR IUD 1 each by Intrauterine route once.     metoprolol  succinate (TOPROL -XL) 25 MG 24 hr tablet Take 1 tablet (25 mg total) by mouth daily. 90 tablet 1   No current facility-administered medications for this visit.    ROS: Review of Systems  Objective:  Objective: Psychiatric Specialty Exam: General Appearance: Casual, fairly groomed  Eye Contact:  Good    Speech:  Clear, coherent, normal rate,  spontaneous  Volume:  Normal   Mood:  see above  Affect:  Appropriate, congruent, full range  Thought Content: Logical, rumination  ***  Suicidal Thoughts: see subjective  Thought Process:  Coherent, goal-directed, circumstantial ***  Orientation:  A&Ox4   Memory:  Immediate good  Judgment:  Fair   Insight:  Fair***  Concentration:  Attention and concentration good   Recall:  Good  Fund of Knowledge: Good  Language: Good, fluent  Psychomotor Activity: Normal  Akathisia:  NA   AIMS (if indicated): NA   Assets:   {Assets (PAA):22698}  ADL's:  Intact  Cognition: WNL  Sleep: see above  Appetite: see above    Physical Exam   Metabolic Disorder Labs: No results found for: HGBA1C, MPG No results found for: PROLACTIN Lab Results  Component Value Date   CHOL 226 (H) 06/06/2018   TRIG 287 (H) 06/06/2018   HDL 44 06/06/2018   CHOLHDL 5.1 (H) 06/06/2018   LDLCALC 125 (H) 06/06/2018   Lab Results  Component Value Date   TSH 0.47 03/13/2024   TSH 0.42 08/25/2023    Therapeutic Level Labs: No results found for: LITHIUM No results found for: VALPROATE No results found for: CBMZ  Screenings:  GAD-7    Flowsheet Row [facility] Office Visit from 02/24/2024 in [facility] Office Visit from 07/05/2019 in Primary Care at [facility]  Total GAD-7 Score 12 11 0   PHQ2-9    Flowsheet Row Office Visit from 05/28/2024 in [facility] Office Visit from 02/24/2024 in [facility] at [facility] Office Visit from 07[facility]05/2023 in [facility] at [facility] Office Visit from 05/20/2022 in [facility] at [facility] Office Visit from 03/16/2022 in [facility] at [facility]  PHQ-2 Total Score 3 2 1 2  0  PHQ-9 Total Score  13 7 -- 9 --   Flowsheet Row UC from 05/26/2024 in [facility]) UC from 01/26/2023 in [facility] [employer]) UC from 01/03/2023 in [facility])  C-SSRS RISK CATEGORY No Risk No Risk No Risk    Collaboration of Care:   Patient/Guardian was advised Release of Information must be obtained prior to any record release in order to collaborate their care with an outside provider. Patient/Guardian was advised if they have not  already done so to contact the registration department to sign all necessary forms in order for us  to release information regarding their care.   Consent: Patient/Guardian gives verbal consent for treatment and assignment of benefits for services provided during this visit. Patient/Guardian expressed understanding and agreed to proceed.    Marlo Masson, MD 06/25/2024, 2:06 PM      [1] No Known Allergies

## 2024-07-02 ENCOUNTER — Ambulatory Visit (HOSPITAL_COMMUNITY): Admitting: Student in an Organized Health Care Education/Training Program

## 2024-07-09 ENCOUNTER — Telehealth (HOSPITAL_COMMUNITY): Admitting: Student in an Organized Health Care Education/Training Program

## 2024-07-09 DIAGNOSIS — F331 Major depressive disorder, recurrent, moderate: Secondary | ICD-10-CM | POA: Diagnosis not present

## 2024-07-09 DIAGNOSIS — T43225A Adverse effect of selective serotonin reuptake inhibitors, initial encounter: Secondary | ICD-10-CM

## 2024-07-09 DIAGNOSIS — F411 Generalized anxiety disorder: Secondary | ICD-10-CM | POA: Diagnosis not present

## 2024-07-09 MED ORDER — ESCITALOPRAM OXALATE 10 MG PO TABS: 10.0000 mg | ORAL_TABLET | Freq: Every day | ORAL | 0 refills | Status: DC

## 2024-07-09 MED ORDER — FLUOXETINE HCL 10 MG PO CAPS: 10.0000 mg | ORAL_CAPSULE | Freq: Every day | ORAL | 0 refills | Status: DC

## 2024-07-09 NOTE — Progress Notes (Signed)
 BH MD Outpatient Progress Note  07/09/2024 4:41 PM PERINA SALVAGGIO  MRN:  [id number]  Virtual Visit via Video Note  I connected with Sherry Ray on 07/09/24 at  9:30 AM EST by a video enabled telemedicine application and verified that I am speaking with the correct person using two identifiers.  Location: Patient: Home Provider: Office   I discussed the limitations, risks, security and privacy concerns of performing an evaluation and management service by telephone and the availability of in person appointments. I also discussed with the patient that there may be a patient responsible charge related to this service. The patient expressed understanding and agreed to proceed.   I discussed the assessment and treatment plan with the patient. The patient was provided an opportunity to ask questions and all were answered. The patient agreed with the plan and demonstrated an understanding of the instructions.   The patient was advised to call back or seek an in-person evaluation if the symptoms worsen or if the condition fails to improve as anticipated.   Marlo Masson, MD Psych Resident, PGY-3    Assessment:  Sherry Ray presents for follow-up evaluation on 07/09/24 .   Since the last visit, the patients serotonin discontinuation syndrome has resolved following initiation of Prozac , and mood symptoms have stabilized.  No changes to psychotropic regimen are indicated at this time.  Will consider discontinuing Prozac  at the next visit. The previously submitted referral for a sleep study remains pending. No refills submitted in this encounter as the patient was seen earlier in the month and has enough refills until follow-up appointment.   Identifying Information: Sherry Ray is a 44 y.o. female with a history of depression and anxiety  who is an established patient with [facility] for medication management. Initial evaluation by this provider  completed on 05/28/2024. For a comprehensive history and detailed assessment, please refer to the initial adult assessment.  The patient's PMHx is significant for migraines with aura, hypertension, and Antithrombin deficiency w/ prior history of DVT (1996) and PE (2008). .   Plan:  # MDD, recurrent, moderate, without psychosis # GAD # Serotonin discontinuation syndrome Past medication trials: Effexor (stopped due to AEs), Prozac  (unclear if ineffective),Zoloft  (optimized to 200 mg, discontinued due to ineffectiveness) Status of problem:stabalizing Interventions: -- Continue Lexapro  10 mg daily for depression and anxiety (s12/10/2023, i12/05/2024) -- Continue Prozac  10 mg daily for serotonin discontinuation syndrome -- Continue hydroxyzine  10 mg 3 times daily as needed for acute anxiety ; no refills at this visit   #Sleeping difficulties # R/o OSA Past medication trials:  Status of problem: poor, unchanged Interventions: -- Referral pending, submitted on 1/12    Health Maintenance PCP: Jason Leita Repine, FNP (Inactive) @ [facility]  Hyperthyroidism/Thyroid  Nodules- HTN - metoprolol  IUD Migraine w/ aura -ibuprofen  as needed Hx of DVTs (1996) Hx of PE (2008)  Patient was given contact information for behavioral health clinic and was instructed to call 911 for emergencies.   Subjective:  Chief Complaint:  Chief Complaint  Patient presents with   Follow-up    Interval History:  Patient reports she has been doing better overall and believes that adding fluoxetine  contributed to improvement, stating it took about a week for her body to adjust and that withdrawal symptoms have since resolved. She reports she is no longer experiencing brain zaps. She reports her mood is slightly better and believes some of the improvement is related to feeling physically better, though she  describes motivation as continuing to be a challenge. She reports anxiety has been well managed  and describes escitalopram  as helpful for her anxiety symptoms. She reports she is comfortable remaining at the current medication doses and would like to see if motivation improves over time.  She reports she does not take hydroxyzine  regularly and describes being compliant with her prescribed medications without missing doses. She reports she is seeing her therapist every two weeks and identifies the therapist as Eleanor Nose at [facility]. She reports no adverse effects from her medications.  She reports her sleep has been ok overall but describes some recent disruption related to caring for her 74 year old child who has had the flu and strep. She reports no alcohol, tobacco, or cannabis use. She reports she is not experiencing suicidal ideation, homicidal ideation, or auditory or visual hallucinations.    Visit Diagnosis:    ICD-10-CM   1. Selective serotonin reuptake inhibitor (SSRI) discontinuation syndrome  T43.225A DISCONTINUED: FLUoxetine  (PROZAC ) 10 MG capsule    2. Moderate episode of recurrent major depressive disorder ([name])  F33.1 DISCONTINUED: escitalopram  (LEXAPRO ) 10 MG tablet    3. GAD (generalized anxiety disorder)  F41.1 DISCONTINUED: escitalopram  (LEXAPRO ) 10 MG tablet      Past Psychiatric History: Diagnoses: MDD, GAD Medication trials: Prozac  - ineffective, Effexor - adverse effects including brain zaps Previous psychiatrist/therapist: Currently seeing Eleanor Nose for therapy once per month Hospitalizations: No Suicide attempts: No NSSIB Hx: No Hx of violence towards others: No Hx of trauma/abuse: No   Additional interval history: 06/25/2024-serotonin discontinuation syndrome following Zoloft  taper, patient started on Prozac  10 mg 07/09/2024-serotonin discontinuation syndrome resolved   Substance Abuse History: Alcohol: Current occasional alcohol use Hx of withdrawal seizures/Dts: Denies Tobacco: none Cannabis: Takes THC gummies twice  per week. 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 Marital Status: Married to husband Children: 2 sons, 6 and 4    Social History   Socioeconomic History   Marital status: Married    Spouse name: Not on file   Number of children: Not on file   Years of education: Not on file   Highest education level: Not on file  Occupational History   Not on file  Tobacco Use   Smoking status: Never   Smokeless tobacco: Never  Vaping Use   Vaping status: Never Used  Substance and Sexual Activity   Alcohol use: No    Alcohol/week: 0.0 standard drinks of alcohol   Drug use: No   Sexual activity: Yes    Birth control/protection: None  Other Topics Concern   Not on file  Social History Narrative   Not on file   Social Drivers of Health   Tobacco Use: Low Risk (06/25/2024)   Patient History    Smoking Tobacco Use: Never    Smokeless Tobacco Use: Never    Passive Exposure: Not on file  Financial Resource Strain: Not on file  Food  Insecurity: Low Risk (06/20/2024)   Received from [facility]   Epic    Within the past 12 months, you worried that your food would run out before you got money to buy more: Never true    Within the past 12 months, the food you bought just didn't last and you didn't have money to get more. : Never true   Transportation Needs: No Transportation Needs (06/20/2024)   Received from [facility]    In the past 12 months, has lack of reliable transportation kept you from medical appointments, meetings, work or from getting things needed for daily living? : No  Physical Activity: Not on file  Stress: Not on file  Social Connections: Unknown (10/15/2021)   Received from [facility]   Social Network    Social Network: Not on file  Depression (PHQ2-9): High Risk (05/28/2024)   Depression (PHQ2-9)    PHQ-2 Score: 13  Alcohol Screen: Not on file  Housing: Low Risk (06/20/2024)   Received from [facility]   Epic    What is your living situation today?: I have a steady place to live    Think about the place you live. Do you have problems with any of the following? Choose all that apply:: None/None on this list  Utilities: Low Risk (06/20/2024)   Received from [facility]   Utilities    In the past 12 months has the electric, gas, oil, or water  company threatened to shut off services in your home? : No  Health Literacy: Not on file    Allergies: Allergies[1]  Current Medications: Current Outpatient Medications  Medication Sig Dispense Refill   busPIRone  (BUSPAR ) 5 MG tablet Take 1 tablet (5 mg total) by mouth 2 (two) times daily. (Patient taking differently: Take 5 mg by mouth at bedtime as needed.) 180 tablet 0   cetirizine (ZYRTEC) 10 MG tablet Take 10 mg by mouth daily.      fluticasone  (FLONASE ) 50 MCG/ACT nasal spray Place 1 spray into both nostrils daily. 16 g 0   hydrOXYzine  (ATARAX ) 10 MG tablet Take 1 tablet (10 mg total) by mouth 3 (three) times daily as needed for anxiety. 90 tablet 2   levonorgestrel (MIRENA) 20 MCG/24HR IUD 1 each by Intrauterine route once.     metoprolol  succinate (TOPROL -XL) 25 MG 24 hr tablet Take 1 tablet (25 mg total) by mouth daily. 90 tablet 1   No current facility-administered medications for this visit.    ROS: Review of Systems   All other systems reviewed and are negative.   Objective:  Objective: Psychiatric Specialty Exam: General Appearance: Casual, fairly groomed  Eye Contact:  Good    Speech:  Clear, coherent, normal rate, spontaneous  Volume:  Normal   Mood:  see above  Affect: brighter, less depressed from prior  Thought Content: Logical,  Suicidal Thoughts: see subjective  Thought Process:  Coherent, goal-directed  Orientation:  A&Ox4   Memory:  Immediate good  Judgment:  Fair   Insight:  Fair  Concentration:  Attention and concentration good   Recall:  Good  Fund of Knowledge: Good  Language: Good, fluent  Psychomotor Activity: Grossly normal  Akathisia:  NA   AIMS (if indicated): NA   Assets:   Communication Skills Desire for Improvement Resilience  ADL's:  Intact  Cognition: WNL  Sleep: see above  Appetite: see above    Physical Exam Constitutional:      General: She is not in acute distress.  Appearance: She is not ill-appearing.  HENT:     Head: Normocephalic and atraumatic.  Eyes:     Extraocular Movements: Extraocular movements intact.     Conjunctiva/sclera: Conjunctivae normal.  Pulmonary:     Effort: Pulmonary effort is normal. No respiratory distress.  Musculoskeletal:        General: Normal range of motion.  Skin:    General: Skin is warm and dry.  Neurological:     General: No focal deficit present.     Mental Status: She is oriented to person, place, and time.      Metabolic Disorder Labs: No results found for: HGBA1C, MPG No results found for: PROLACTIN Lab Results  Component Value Date   CHOL 226 (H) 06/06/2018   TRIG 287 (H) 06/06/2018   HDL 44 06/06/2018   CHOLHDL 5.1 (H) 06/06/2018   LDLCALC 125 (H) 06/06/2018   Lab Results  Component Value Date   TSH 0.47 03/13/2024   TSH 0.42 08/25/2023    Therapeutic Level Labs: No results found for: LITHIUM No results found for: VALPROATE No results found for: CBMZ  Screenings:   GAD-7    Flowsheet Row [facility] Office Visit from 02/24/2024 in [facility] Office Visit from 07/05/2019 in Primary Care at [facility]  Total GAD-7 Score 12 11 0   PHQ2-9    Flowsheet Row Office Visit from 05/28/2024 in [facility] Office Visit from 02/24/2024 in [facility] at [facility] Office Visit from 07[facility]05/2023 in [facility] at [facility] Office Visit from 05/20/2022 in [facility] Primary Care at [facility] Office Visit from 03/16/2022 in [facility] at [facility]  PHQ-2 Total Score 3 2 1 2  0  PHQ-9 Total Score 13 7 -- 9 --   Flowsheet Row UC from 05/26/2024 in [facility] UC from 01/26/2023 in [facility] UC from 01/03/2023 in [facility] at [facility])  C-SSRS RISK CATEGORY No Risk No Risk No Risk    Collaboration of Care:   Patient/Guardian was advised Release of Information must be obtained prior to any record release in order to collaborate their care with an outside provider. Patient/Guardian was advised if they have not already done so to contact the registration department to sign all necessary forms in order for us  to release information regarding their care.   Consent: Patient/Guardian gives verbal consent for treatment and assignment of benefits for services provided during this visit. Patient/Guardian expressed understanding and agreed to proceed.    Marlo Masson, MD 07/09/2024, 4:41 PM        [1] No Known Allergies

## 2024-07-11 ENCOUNTER — Ambulatory Visit (HOSPITAL_COMMUNITY): Admitting: Student in an Organized Health Care Education/Training Program

## 2024-07-20 ENCOUNTER — Telehealth: Payer: Self-pay | Admitting: Family

## 2024-07-20 NOTE — Telephone Encounter (Signed)
 Requesting TOC from [place] to [place]. Please advise

## 2024-08-06 ENCOUNTER — Institutional Professional Consult (permissible substitution): Admitting: Neurology

## 2024-08-13 ENCOUNTER — Ambulatory Visit (HOSPITAL_COMMUNITY): Admitting: Student in an Organized Health Care Education/Training Program

## 2024-08-23 ENCOUNTER — Encounter: Admitting: Family Medicine

## 2024-10-17 ENCOUNTER — Other Ambulatory Visit: Payer: Self-pay

## 2025-02-13 ENCOUNTER — Telehealth: Admitting: Hematology and Oncology

## 2025-03-13 ENCOUNTER — Ambulatory Visit: Admitting: Internal Medicine
